# Patient Record
Sex: Female | Born: 1960
Health system: Southern US, Community
[De-identification: ages and names within clinical notes are randomized; demographics above are authoritative.]

## PROBLEM LIST (undated history)

## (undated) DIAGNOSIS — R9389 Abnormal findings on diagnostic imaging of other specified body structures: Secondary | ICD-10-CM

## (undated) DIAGNOSIS — D649 Anemia, unspecified: Secondary | ICD-10-CM

## (undated) DIAGNOSIS — F1020 Alcohol dependence, uncomplicated: Secondary | ICD-10-CM

## (undated) DIAGNOSIS — F319 Bipolar disorder, unspecified: Secondary | ICD-10-CM

## (undated) DIAGNOSIS — E782 Mixed hyperlipidemia: Secondary | ICD-10-CM

## (undated) DIAGNOSIS — F1410 Cocaine abuse, uncomplicated: Secondary | ICD-10-CM

## (undated) DIAGNOSIS — A599 Trichomoniasis, unspecified: Secondary | ICD-10-CM

## (undated) DIAGNOSIS — K760 Fatty (change of) liver, not elsewhere classified: Secondary | ICD-10-CM

## (undated) DIAGNOSIS — R569 Unspecified convulsions: Secondary | ICD-10-CM

## (undated) DIAGNOSIS — Z8659 Personal history of other mental and behavioral disorders: Secondary | ICD-10-CM

## (undated) DIAGNOSIS — Z973 Presence of spectacles and contact lenses: Secondary | ICD-10-CM

## (undated) DIAGNOSIS — Z87898 Personal history of other specified conditions: Secondary | ICD-10-CM

## (undated) HISTORY — PX: CERVICAL BIOPSY: SHX590

## (undated) HISTORY — PX: TUBAL LIGATION: SHX77

## (undated) HISTORY — DX: Alcohol dependence, uncomplicated: F10.20

---

## 1992-10-14 HISTORY — PX: OTHER SURGICAL HISTORY: SHX169

## 1999-09-21 ENCOUNTER — Emergency Department (HOSPITAL_COMMUNITY): Admission: EM | Admit: 1999-09-21 | Discharge: 1999-09-21 | Payer: Self-pay | Admitting: Emergency Medicine

## 1999-09-25 ENCOUNTER — Emergency Department (HOSPITAL_COMMUNITY): Admission: EM | Admit: 1999-09-25 | Discharge: 1999-09-25 | Payer: Self-pay | Admitting: Emergency Medicine

## 1999-11-15 ENCOUNTER — Encounter (INDEPENDENT_AMBULATORY_CARE_PROVIDER_SITE_OTHER): Payer: Self-pay | Admitting: *Deleted

## 1999-11-15 LAB — CONVERTED CEMR LAB

## 1999-11-27 ENCOUNTER — Other Ambulatory Visit: Admission: RE | Admit: 1999-11-27 | Discharge: 1999-11-27 | Payer: Self-pay | Admitting: *Deleted

## 1999-11-27 ENCOUNTER — Encounter: Admission: RE | Admit: 1999-11-27 | Discharge: 1999-11-27 | Payer: Self-pay | Admitting: Sports Medicine

## 2000-05-29 ENCOUNTER — Encounter: Admission: RE | Admit: 2000-05-29 | Discharge: 2000-05-29 | Payer: Self-pay | Admitting: Family Medicine

## 2000-10-23 ENCOUNTER — Emergency Department (HOSPITAL_COMMUNITY): Admission: EM | Admit: 2000-10-23 | Discharge: 2000-10-23 | Payer: Self-pay | Admitting: Emergency Medicine

## 2001-09-29 ENCOUNTER — Emergency Department (HOSPITAL_COMMUNITY): Admission: EM | Admit: 2001-09-29 | Discharge: 2001-09-29 | Payer: Self-pay | Admitting: Emergency Medicine

## 2002-10-22 ENCOUNTER — Inpatient Hospital Stay (HOSPITAL_COMMUNITY): Admission: AD | Admit: 2002-10-22 | Discharge: 2002-10-22 | Payer: Self-pay | Admitting: Obstetrics and Gynecology

## 2002-10-22 ENCOUNTER — Encounter: Payer: Self-pay | Admitting: Obstetrics and Gynecology

## 2002-11-10 ENCOUNTER — Inpatient Hospital Stay (HOSPITAL_COMMUNITY): Admission: AD | Admit: 2002-11-10 | Discharge: 2002-11-10 | Payer: Self-pay | Admitting: Obstetrics and Gynecology

## 2003-05-25 ENCOUNTER — Emergency Department (HOSPITAL_COMMUNITY): Admission: EM | Admit: 2003-05-25 | Discharge: 2003-05-26 | Payer: Self-pay | Admitting: Emergency Medicine

## 2003-11-05 ENCOUNTER — Emergency Department (HOSPITAL_COMMUNITY): Admission: EM | Admit: 2003-11-05 | Discharge: 2003-11-05 | Payer: Self-pay | Admitting: Emergency Medicine

## 2004-04-17 ENCOUNTER — Emergency Department (HOSPITAL_COMMUNITY): Admission: AC | Admit: 2004-04-17 | Discharge: 2004-04-17 | Payer: Self-pay | Admitting: Emergency Medicine

## 2004-05-27 ENCOUNTER — Emergency Department (HOSPITAL_COMMUNITY): Admission: EM | Admit: 2004-05-27 | Discharge: 2004-05-27 | Payer: Self-pay | Admitting: Emergency Medicine

## 2004-05-29 ENCOUNTER — Emergency Department (HOSPITAL_COMMUNITY): Admission: EM | Admit: 2004-05-29 | Discharge: 2004-05-29 | Payer: Self-pay | Admitting: Family Medicine

## 2004-11-29 ENCOUNTER — Emergency Department (HOSPITAL_COMMUNITY): Admission: EM | Admit: 2004-11-29 | Discharge: 2004-11-29 | Payer: Self-pay | Admitting: *Deleted

## 2005-11-14 ENCOUNTER — Encounter: Payer: Self-pay | Admitting: Family Medicine

## 2005-11-14 ENCOUNTER — Emergency Department (HOSPITAL_COMMUNITY): Admission: EM | Admit: 2005-11-14 | Discharge: 2005-11-15 | Payer: Self-pay | Admitting: Emergency Medicine

## 2006-04-26 ENCOUNTER — Emergency Department (HOSPITAL_COMMUNITY): Admission: EM | Admit: 2006-04-26 | Discharge: 2006-04-26 | Payer: Self-pay | Admitting: Emergency Medicine

## 2006-07-23 ENCOUNTER — Emergency Department (HOSPITAL_COMMUNITY): Admission: EM | Admit: 2006-07-23 | Discharge: 2006-07-23 | Payer: Self-pay | Admitting: Emergency Medicine

## 2006-12-12 ENCOUNTER — Encounter (INDEPENDENT_AMBULATORY_CARE_PROVIDER_SITE_OTHER): Payer: Self-pay | Admitting: *Deleted

## 2007-05-18 ENCOUNTER — Inpatient Hospital Stay (HOSPITAL_COMMUNITY): Admission: AD | Admit: 2007-05-18 | Discharge: 2007-05-18 | Payer: Self-pay | Admitting: Gynecology

## 2007-06-30 ENCOUNTER — Emergency Department (HOSPITAL_COMMUNITY): Admission: EM | Admit: 2007-06-30 | Discharge: 2007-06-30 | Payer: Self-pay | Admitting: Emergency Medicine

## 2008-09-23 ENCOUNTER — Emergency Department (HOSPITAL_COMMUNITY): Admission: EM | Admit: 2008-09-23 | Discharge: 2008-09-23 | Payer: Self-pay | Admitting: Emergency Medicine

## 2008-10-14 DIAGNOSIS — A599 Trichomoniasis, unspecified: Secondary | ICD-10-CM

## 2008-10-14 DIAGNOSIS — Z8619 Personal history of other infectious and parasitic diseases: Secondary | ICD-10-CM

## 2008-10-14 HISTORY — DX: Trichomoniasis, unspecified: A59.9

## 2008-10-14 HISTORY — DX: Personal history of other infectious and parasitic diseases: Z86.19

## 2009-03-01 ENCOUNTER — Emergency Department (HOSPITAL_COMMUNITY): Admission: EM | Admit: 2009-03-01 | Discharge: 2009-03-01 | Payer: Self-pay | Admitting: Family Medicine

## 2009-07-06 ENCOUNTER — Encounter: Payer: Self-pay | Admitting: Obstetrics & Gynecology

## 2009-07-06 ENCOUNTER — Ambulatory Visit: Payer: Self-pay | Admitting: Obstetrics & Gynecology

## 2009-07-06 LAB — CONVERTED CEMR LAB

## 2009-07-16 ENCOUNTER — Emergency Department (HOSPITAL_COMMUNITY): Admission: EM | Admit: 2009-07-16 | Discharge: 2009-07-16 | Payer: Self-pay | Admitting: Emergency Medicine

## 2010-04-17 ENCOUNTER — Emergency Department: Payer: Self-pay | Admitting: Unknown Physician Specialty

## 2010-05-09 ENCOUNTER — Emergency Department (HOSPITAL_COMMUNITY): Admission: EM | Admit: 2010-05-09 | Discharge: 2010-05-09 | Payer: Self-pay | Admitting: Family Medicine

## 2010-08-05 ENCOUNTER — Emergency Department (HOSPITAL_COMMUNITY): Admission: EM | Admit: 2010-08-05 | Discharge: 2010-08-05 | Payer: Self-pay | Admitting: Family Medicine

## 2010-11-04 ENCOUNTER — Encounter: Payer: Self-pay | Admitting: *Deleted

## 2010-12-29 LAB — POCT URINALYSIS DIP (DEVICE)
Glucose, UA: NEGATIVE mg/dL
Ketones, ur: NEGATIVE mg/dL
Protein, ur: NEGATIVE mg/dL

## 2010-12-29 LAB — POCT PREGNANCY, URINE: Preg Test, Ur: NEGATIVE

## 2010-12-29 LAB — GC/CHLAMYDIA PROBE AMP, GENITAL: GC Probe Amp, Genital: NEGATIVE

## 2010-12-29 LAB — WET PREP, GENITAL: Yeast Wet Prep HPF POC: NONE SEEN

## 2011-01-17 LAB — CBC
Hemoglobin: 9.8 g/dL — ABNORMAL LOW (ref 12.0–15.0)
MCHC: 32 g/dL (ref 30.0–36.0)
MCV: 70.1 fL — ABNORMAL LOW (ref 78.0–100.0)
RBC: 4.4 MIL/uL (ref 3.87–5.11)

## 2011-01-17 LAB — URINALYSIS, ROUTINE W REFLEX MICROSCOPIC
Bilirubin Urine: NEGATIVE
Glucose, UA: NEGATIVE mg/dL
Hgb urine dipstick: NEGATIVE
Ketones, ur: NEGATIVE mg/dL
Protein, ur: NEGATIVE mg/dL
pH: 6 (ref 5.0–8.0)

## 2011-01-17 LAB — BASIC METABOLIC PANEL
CO2: 27 mEq/L (ref 19–32)
Chloride: 100 mEq/L (ref 96–112)
GFR calc Af Amer: >60 mL/min (ref >60)
Glucose, Bld: 105 mg/dL — ABNORMAL HIGH (ref 70–99)
Sodium: 135 mEq/L (ref 135–145)

## 2011-01-17 LAB — DIFFERENTIAL
Basophils Relative: 0 % (ref 0–1)
Eosinophils Relative: 1 % (ref 0–5)
Lymphs Abs: 1.1 10*3/uL (ref 0.7–4.0)
Monocytes Absolute: 0.7 10*3/uL (ref 0.1–1.0)
Neutro Abs: 8.3 10*3/uL — ABNORMAL HIGH (ref 1.7–7.7)

## 2011-01-17 LAB — WET PREP, GENITAL: Yeast Wet Prep HPF POC: NONE SEEN

## 2011-01-22 LAB — POCT URINALYSIS DIP (DEVICE)
Bilirubin Urine: NEGATIVE
Glucose, UA: NEGATIVE mg/dL
Hgb urine dipstick: NEGATIVE
Ketones, ur: NEGATIVE mg/dL
Nitrite: NEGATIVE
Specific Gravity, Urine: <=1.005 (ref 1.005–1.030)
pH: 5.5 (ref 5.0–8.0)

## 2011-07-19 LAB — POCT URINALYSIS DIP (DEVICE)
Bilirubin Urine: NEGATIVE
Glucose, UA: NEGATIVE mg/dL
Ketones, ur: NEGATIVE mg/dL
Nitrite: NEGATIVE
Specific Gravity, Urine: 1.015 (ref 1.005–1.030)
pH: 6 (ref 5.0–8.0)

## 2011-07-29 LAB — URINALYSIS, ROUTINE W REFLEX MICROSCOPIC
Glucose, UA: NEGATIVE
Leukocytes, UA: NEGATIVE
Protein, ur: NEGATIVE
Specific Gravity, Urine: 1.02

## 2011-07-29 LAB — WET PREP, GENITAL

## 2011-07-29 LAB — URINE MICROSCOPIC-ADD ON

## 2011-07-29 LAB — GC/CHLAMYDIA PROBE AMP, GENITAL: GC Probe Amp, Genital: NEGATIVE

## 2011-07-29 LAB — POCT PREGNANCY, URINE: Operator id: 114931

## 2011-09-20 ENCOUNTER — Emergency Department: Payer: Self-pay | Admitting: Emergency Medicine

## 2011-10-09 ENCOUNTER — Ambulatory Visit: Payer: Self-pay | Admitting: Obstetrics and Gynecology

## 2011-10-23 ENCOUNTER — Emergency Department (HOSPITAL_COMMUNITY)
Admission: EM | Admit: 2011-10-23 | Discharge: 2011-10-23 | Disposition: A | Discharge status: Home / Self Care | Payer: Self-pay | Source: Home / Self Care

## 2011-10-23 ENCOUNTER — Encounter (HOSPITAL_COMMUNITY): Payer: Self-pay | Admitting: Cardiology

## 2011-10-23 DIAGNOSIS — N760 Acute vaginitis: Secondary | ICD-10-CM

## 2011-10-23 DIAGNOSIS — M25579 Pain in unspecified ankle and joints of unspecified foot: Secondary | ICD-10-CM

## 2011-10-23 DIAGNOSIS — M25571 Pain in right ankle and joints of right foot: Secondary | ICD-10-CM

## 2011-10-23 HISTORY — DX: Trichomoniasis, unspecified: A59.9

## 2011-10-23 LAB — POCT URINALYSIS DIP (DEVICE)
Bilirubin Urine: NEGATIVE
Glucose, UA: NEGATIVE mg/dL
Ketones, ur: NEGATIVE mg/dL
Leukocytes, UA: NEGATIVE
Nitrite: NEGATIVE

## 2011-10-23 LAB — WET PREP, GENITAL: Yeast Wet Prep HPF POC: NONE SEEN

## 2011-10-23 MED ORDER — DICLOFENAC SODIUM 75 MG PO TBEC: 75.0000 mg | DELAYED_RELEASE_TABLET | Freq: Two times a day (BID) | ORAL | Status: AC

## 2011-10-23 MED ORDER — METRONIDAZOLE 500 MG PO TABS: 500.0000 mg | ORAL_TABLET | Freq: Two times a day (BID) | ORAL | Status: AC

## 2011-10-23 NOTE — ED Notes (Signed)
Pt reports vaginal discharge brownish in color with fishy odor. Vaginal itching and burning on urination.This has been going on for the past 3 weeks. Pt also reports bilat foot pain and is requesting referral to a "foot" doctor. Denies fever.

## 2011-10-23 NOTE — ED Provider Notes (Signed)
History     CSN: [id number]  Arrival date & time 10/23/11  0855   None     Chief Complaint  Patient presents with  . Vaginal Discharge    (Consider location/radiation/quality/duration/timing/severity/associated sxs/prior treatment) HPI Comments: Pt presents today with two concerns. She has had vaginal discharge and odor x 3 weeks. She denies any new partners. No pelvic pain. Sometimes has mild discomfort with urination, but no urinary frequency or urgency. Pt also began having Rt ankle pain 2 days ago. Pain has improved but not completely resolved. She denies injury or aggrevating activity. She has a hx of bunions and is concerned that the pain is coming from her bunion.   The history is provided by the patient.    Past Medical History  Diagnosis Date  . Trichimoniasis 2010    Past Surgical History  Procedure Date  . Cesarean section 1990  . Tubaligation 1994    History reviewed. No pertinent family history.  History  Substance Use Topics  . Smoking status: Current Everyday Smoker -- 0.5 packs/day  . Smokeless tobacco: Not on file  . Alcohol Use: Yes     occas    OB History    Grav Para Term Preterm Abortions TAB SAB Ect Mult Living                  Review of Systems  Genitourinary: Positive for vaginal discharge. Negative for dysuria, frequency, vaginal bleeding and pelvic pain.  Musculoskeletal: Negative for joint swelling.    Allergies  Review of patient's allergies indicates no known allergies.  Home Medications   Current Outpatient Rx  Name Route Sig Dispense Refill  . DICLOFENAC SODIUM 75 MG PO TBEC Oral Take 1 tablet (75 mg total) by mouth 2 (two) times daily. 20 tablet 0  . METRONIDAZOLE 500 MG PO TABS Oral Take 1 tablet (500 mg total) by mouth 2 (two) times daily. 14 tablet 0    BP 147/98  Pulse 65  Temp(Src) 98 F (36.7 C) (Oral)  Resp 16  SpO2 99%  LMP 03/15/2011  Physical Exam  Nursing note and vitals reviewed. Constitutional: She  appears well-developed and well-nourished. No distress.  HENT:  Head: Normocephalic and atraumatic.  Cardiovascular: Normal rate, regular rhythm and normal heart sounds.   Pulmonary/Chest: Effort normal and breath sounds normal. No respiratory distress.  Genitourinary: Uterus normal. There is no rash, tenderness or lesion on the right labia. There is no rash, tenderness or lesion on the left labia. Cervix exhibits no motion tenderness, no discharge and no friability. Right adnexum displays no mass, no tenderness and no fullness. Left adnexum displays no mass, no tenderness and no fullness. No erythema, tenderness or bleeding around the vagina. No foreign body around the vagina. No signs of injury around the vagina. Vaginal discharge found.  Musculoskeletal:       Right ankle: She exhibits normal range of motion, no swelling, no ecchymosis, no deformity, no laceration and normal pulse. tenderness. AITFL (mild) tenderness found. No lateral malleolus, no medial malleolus, no CF ligament, no posterior TFL, no head of 5th metatarsal and no proximal fibula tenderness found. Achilles tendon normal.       Feet:    ED Course  Procedures (including critical care time)   Labs Reviewed  POCT URINALYSIS DIP (DEVICE)  POCT URINALYSIS DIPSTICK  GC/CHLAMYDIA PROBE AMP, GENITAL  WET PREP, GENITAL   No results found.   1. Vaginitis   2. Ankle pain, right  MDM  Vaginal discharge x 3 wks. GC/Chlamydia & wet prep pending.  Mild Rt ankle pain - improving, no trauma and nontender Rt hallux valgux. Discussed bunions and referral to orthopedist if becomes painful. Ankle pain is improving w/o treatment. Will treat with NSAID and f/u with ortho if symptoms persist or worsen.         Melody Comas, [place] 10/28/11 [phone]

## 2011-10-24 LAB — GC/CHLAMYDIA PROBE AMP, GENITAL: Chlamydia, DNA Probe: NEGATIVE

## 2011-10-25 ENCOUNTER — Telehealth (HOSPITAL_COMMUNITY): Payer: Self-pay | Admitting: *Deleted

## 2011-10-28 ENCOUNTER — Telehealth (HOSPITAL_COMMUNITY): Payer: Self-pay | Admitting: *Deleted

## 2011-10-29 ENCOUNTER — Telehealth (HOSPITAL_COMMUNITY): Payer: Self-pay | Admitting: *Deleted

## 2011-10-29 NOTE — ED Provider Notes (Signed)
Medical screening examination/treatment/procedure(s) were performed by non-physician practitioner and as supervising physician I was immediately available for consultation/collaboration.  Corrie Mckusick, MD 10/29/11 1215

## 2012-10-20 ENCOUNTER — Encounter (HOSPITAL_COMMUNITY): Payer: Self-pay | Admitting: *Deleted

## 2012-10-20 ENCOUNTER — Emergency Department (INDEPENDENT_AMBULATORY_CARE_PROVIDER_SITE_OTHER)
Admission: EM | Admit: 2012-10-20 | Discharge: 2012-10-20 | Disposition: A | Discharge status: Home / Self Care | Payer: Self-pay | Source: Home / Self Care | Attending: Family Medicine | Admitting: Family Medicine

## 2012-10-20 DIAGNOSIS — G5600 Carpal tunnel syndrome, unspecified upper limb: Secondary | ICD-10-CM

## 2012-10-20 DIAGNOSIS — G5603 Carpal tunnel syndrome, bilateral upper limbs: Secondary | ICD-10-CM

## 2012-10-20 MED ORDER — DICLOFENAC POTASSIUM 50 MG PO TABS: 50.0000 mg | ORAL_TABLET | Freq: Three times a day (TID) | ORAL | Status: DC

## 2012-10-20 NOTE — ED Notes (Signed)
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

## 2012-10-20 NOTE — ED Provider Notes (Signed)
History     CSN: [id number]  Arrival date & time 10/20/12  1126   First MD Initiated Contact with Patient 10/20/12 1144      Chief Complaint  Patient presents with  . Hand Problem    (Consider location/radiation/quality/duration/timing/severity/associated sxs/prior treatment) Patient is a 52 y.o. female presenting with hand pain. The history is provided by the patient.  Hand Pain This is a new problem. The current episode started more than 1 week ago (has been told in [place] that she had CTS and would needed surg but sx resolved  until recently.). The problem has been gradually worsening.    Past Medical History  Diagnosis Date  . Trichimoniasis 2010    Past Surgical History  Procedure Date  . Cesarean section 1990  . Tubaligation 1994    No family history on file.  History  Substance Use Topics  . Smoking status: Current Every Day Smoker -- 0.5 packs/day  . Smokeless tobacco: Not on file  . Alcohol Use: Yes     Comment: occas    OB History    Grav Para Term Preterm Abortions TAB SAB Ect Mult Living                  Review of Systems  Constitutional: Negative.   Musculoskeletal: Positive for myalgias.  Skin: Negative.   Neurological: Positive for numbness.    Allergies  Review of patient's allergies indicates no known allergies.  Home Medications   Current Outpatient Rx  Name  Route  Sig  Dispense  Refill  . DICLOFENAC POTASSIUM 50 MG PO TABS   Oral   Take 1 tablet (50 mg total) by mouth 3 (three) times daily.   30 tablet   0   . DICLOFENAC SODIUM 75 MG PO TBEC   Oral   Take 1 tablet (75 mg total) by mouth 2 (two) times daily.   20 tablet   0     BP 128/87  Pulse 80  Temp 97.9 F (36.6 C) (Oral)  Resp 16  SpO2 98%  LMP 03/15/2011  Physical Exam  Nursing note and vitals reviewed. Constitutional: She is oriented to person, place, and time. She appears well-developed and well-nourished.  Musculoskeletal: She exhibits tenderness.   Right wrist: She exhibits tenderness. She exhibits normal range of motion.       Arms: Neurological: She is alert and oriented to person, place, and time.  Skin: Skin is warm and dry.    ED Course  Procedures (including critical care time)  Labs Reviewed - No data to display No results found.   1. Carpal tunnel syndrome on both sides       MDM          Linna Hoff, MD 10/20/12 1218

## 2012-10-20 NOTE — ED Notes (Signed)
Bilateral  Wrist splints

## 2013-07-08 ENCOUNTER — Encounter (HOSPITAL_COMMUNITY): Payer: Self-pay | Admitting: Emergency Medicine

## 2013-07-08 ENCOUNTER — Emergency Department (HOSPITAL_COMMUNITY)
Admission: EM | Admit: 2013-07-08 | Discharge: 2013-07-08 | Disposition: A | Discharge status: Home / Self Care | Payer: Self-pay | Attending: Emergency Medicine | Admitting: Emergency Medicine

## 2013-07-08 DIAGNOSIS — F17200 Nicotine dependence, unspecified, uncomplicated: Secondary | ICD-10-CM | POA: Insufficient documentation

## 2013-07-08 DIAGNOSIS — N898 Other specified noninflammatory disorders of vagina: Secondary | ICD-10-CM | POA: Insufficient documentation

## 2013-07-08 DIAGNOSIS — M545 Low back pain, unspecified: Secondary | ICD-10-CM | POA: Insufficient documentation

## 2013-07-08 DIAGNOSIS — M25579 Pain in unspecified ankle and joints of unspecified foot: Secondary | ICD-10-CM | POA: Insufficient documentation

## 2013-07-08 DIAGNOSIS — A599 Trichomoniasis, unspecified: Secondary | ICD-10-CM | POA: Insufficient documentation

## 2013-07-08 DIAGNOSIS — M25571 Pain in right ankle and joints of right foot: Secondary | ICD-10-CM

## 2013-07-08 DIAGNOSIS — R209 Unspecified disturbances of skin sensation: Secondary | ICD-10-CM | POA: Insufficient documentation

## 2013-07-08 LAB — WET PREP, GENITAL

## 2013-07-08 MED ORDER — OXYCODONE-ACETAMINOPHEN 5-325 MG PO TABS
2.0000 | ORAL_TABLET | Freq: Once | ORAL | Status: AC
  Administered 2013-07-08: 2 via ORAL
  Filled 2013-07-08: qty 2

## 2013-07-08 MED ORDER — METRONIDAZOLE 500 MG PO TABS: 500.0000 mg | ORAL_TABLET | Freq: Two times a day (BID) | ORAL | Status: DC

## 2013-07-08 MED ORDER — OXYCODONE-ACETAMINOPHEN 5-325 MG PO TABS: 1.0000 | ORAL_TABLET | Freq: Four times a day (QID) | ORAL | Status: DC | PRN

## 2013-07-08 MED ORDER — NAPROXEN 500 MG PO TABS: 500.0000 mg | ORAL_TABLET | Freq: Two times a day (BID) | ORAL | Status: DC

## 2013-07-08 NOTE — ED Notes (Signed)
Bed: WA17 Expected date:  Expected time:  Means of arrival:  Comments: 

## 2013-07-08 NOTE — ED Notes (Signed)
Brought in by [facility] from home with c/o severe right leg pain, from right hip down to right ankle--- onset 3 days ago. Per EMS, pt has progressive pain to right hip down to right ankle; pt also c/o "right foot is feeling numb". Pt denies trauma or injury to affected leg, no s/s apparent deformity to right leg noted.

## 2013-07-08 NOTE — ED Provider Notes (Signed)
CSN: [id number]     Arrival date & time 07/08/13  0612 History   First MD Initiated Contact with Patient 07/08/13 [phone]     Chief Complaint  Patient presents with  . Leg Pain   (Consider location/radiation/quality/duration/timing/severity/associated sxs/prior Treatment) HPI Comments: Patient presents with two separate complaints.  She states that she has been having pain of her right leg for the past 3 days.  Pain starts in the right ankle and radiates up to the right hip.  Pain worse in the right ankle.  She also reports intermittent numbness/tingling of the right foot over the past 3 days.  She denies any injury or trauma to the area.  Denies any erythema or edema of the leg.  She describes the pain as a sharp shooting pain.  She reports very mild pain of her lower back.  She has taken Advil for the pain with mild relief.  Pain worse with movement.  She states that she has had ankle pain in the past and feels that this may be causing the pain in her leg.  She denies fever, chills, bowel/bladder incontinence, weakness, headache, facial asymmetry, difficulty swallowing, or any other symptoms.  Denies recent prolonged travel, surgeries, use of estrogen containing medications, or history of DVT.  Patient able to ambulate.  Patient also complaining of vaginal discharge for the past week.  She states that she is concerned that she may have Trichimoniasis.  She has had this in the past.  She reports whitish colored vaginal discharge with a "fishy" odor.  She is sexually active.  She denies any abdominal or pelvic pain.  Denies fever or chills.    The history is provided by the patient.    Past Medical History  Diagnosis Date  . Trichimoniasis 2010   Past Surgical History  Procedure Laterality Date  . Cesarean section  1990  . Tubaligation  1994   History reviewed. No pertinent family history. History  Substance Use Topics  . Smoking status: Current Every Day Smoker -- 0.50 packs/day  .  Smokeless tobacco: Not on file  . Alcohol Use: Yes     Comment: occas   OB History   Grav Para Term Preterm Abortions TAB SAB Ect Mult Living                 Review of Systems  Allergies  Review of patient's allergies indicates no known allergies.  Home Medications   Current Outpatient Rx  Name  Route  Sig  Dispense  Refill  . metroNIDAZOLE (FLAGYL) 500 MG tablet   Oral   Take 1 tablet (500 mg total) by mouth 2 (two) times daily.   14 tablet   0   . naproxen (NAPROSYN) 500 MG tablet   Oral   Take 1 tablet (500 mg total) by mouth 2 (two) times daily.   30 tablet   0   . oxyCODONE-acetaminophen (PERCOCET/ROXICET) 5-325 MG per tablet   Oral   Take 1-2 tablets by mouth every 6 (six) hours as needed for pain.   20 tablet   0    BP 129/92  Pulse 88  Temp(Src) 99 F (37.2 C)  Resp 16  SpO2 100%  LMP 03/15/2011 Physical Exam  Nursing note and vitals reviewed. Constitutional: She appears well-developed and well-nourished.  HENT:  Head: Normocephalic and atraumatic.  Neck: Normal range of motion. Neck supple.  Cardiovascular: Normal rate, regular rhythm and normal heart sounds.   Pulses:      Dorsalis  pedis pulses are 2+ on the right side, and 2+ on the left side.  Pulmonary/Chest: Effort normal and breath sounds normal.  Abdominal: Soft. There is no tenderness.  Genitourinary: Cervix exhibits no motion tenderness. Right adnexum displays no mass, no tenderness and no fullness. Left adnexum displays no mass, no tenderness and no fullness.  yellowish colored discharge in the vaginal vault.  Musculoskeletal: Normal range of motion.       Right hip: She exhibits normal range of motion, normal strength, no tenderness, no bony tenderness, no swelling and no deformity.       Right knee: She exhibits normal range of motion, no swelling, no effusion and no erythema. No tenderness found.       Right ankle: She exhibits normal range of motion, no swelling and no deformity.  Tenderness.  Bunion of the right foot No erythema or edema of the right leg Full ROM of the right leg  Neurological: She is alert.  Distal sensation of the right foot intact  Skin: Skin is warm and dry. No erythema.  Psychiatric: She has a normal mood and affect.    ED Course  Procedures (including critical care time) Labs Review Labs Reviewed  GC/CHLAMYDIA PROBE AMP  WET PREP, GENITAL   Imaging Review No results found.  7:27 AM Reassessed patient.  She reports that her pain has improved at this time.   MDM  No diagnosis found. Patient presenting with pain of her right leg for the past 3 days.  She reports that the pain is worse in the ankle and radiates up her entire leg to her right hip.  Patient with full ROM of all joints of the right leg.  No signs of infection or DVT.  Patient afebrile.  Patient denies trauma.  Able to ambulate.  Pain improved in the ED after given pain medication.  Feel that the patient is stable for discharge.  Patient discharged home with pain medication and instructed to follow up with PCP if pain persists.  Return precautions given.  Patient also complaining of vaginal discharge.  Few trich on wet prep.  Patient treated with Flagyl.  GC/Chlamydia pending.  No pain with pelvic exam.  Patient given instructions to not drink alcohol while taking Flagyl.      Pascal Lux [place], PA-C 07/08/13 1049

## 2013-07-13 NOTE — ED Provider Notes (Signed)
Medical screening examination/treatment/procedure(s) were performed by non-physician practitioner and as supervising physician I was immediately available for consultation/collaboration.  Raeford Razor, MD 07/13/13 [phone]

## 2013-07-27 ENCOUNTER — Ambulatory Visit: Payer: Self-pay

## 2013-08-12 ENCOUNTER — Ambulatory Visit: Payer: Self-pay

## 2013-09-01 ENCOUNTER — Emergency Department (HOSPITAL_COMMUNITY)
Admission: EM | Admit: 2013-09-01 | Discharge: 2013-09-01 | Disposition: A | Discharge status: Home / Self Care | Payer: Self-pay | Attending: Emergency Medicine | Admitting: Emergency Medicine

## 2013-09-01 ENCOUNTER — Encounter (HOSPITAL_COMMUNITY): Payer: Self-pay | Admitting: Emergency Medicine

## 2013-09-01 DIAGNOSIS — B356 Tinea cruris: Secondary | ICD-10-CM | POA: Insufficient documentation

## 2013-09-01 DIAGNOSIS — F17200 Nicotine dependence, unspecified, uncomplicated: Secondary | ICD-10-CM | POA: Insufficient documentation

## 2013-09-01 DIAGNOSIS — L02419 Cutaneous abscess of limb, unspecified: Secondary | ICD-10-CM | POA: Insufficient documentation

## 2013-09-01 DIAGNOSIS — L0291 Cutaneous abscess, unspecified: Secondary | ICD-10-CM

## 2013-09-01 MED ORDER — SULFAMETHOXAZOLE-TRIMETHOPRIM 800-160 MG PO TABS: 1.0000 | ORAL_TABLET | Freq: Two times a day (BID) | ORAL | Status: AC

## 2013-09-01 MED ORDER — HYDROCODONE-ACETAMINOPHEN 5-325 MG PO TABS: 2.0000 | ORAL_TABLET | ORAL | Status: DC | PRN

## 2013-09-01 MED ORDER — NYSTATIN 100000 UNIT/GM EX CREA: 1.0000 "application " | TOPICAL_CREAM | Freq: Two times a day (BID) | CUTANEOUS | Status: DC

## 2013-09-01 NOTE — Progress Notes (Signed)
Patient listed as not having a pcp or insurance.  EDCM spoke to patient at bedside.  Patient reports that she has applied for Medicaid and is Medicaid pending.  Patient also reports that she is planning to become a patient at the [facility] [place] [facility].  No further CM needs at this time.

## 2013-09-01 NOTE — ED Provider Notes (Signed)
CSN: [id number]     Arrival date & time 09/01/13  1320 History   First MD Initiated Contact with Patient 09/01/13 1540     Chief Complaint  Patient presents with  . Recurrent Skin Infections  . Rash   (Consider location/radiation/quality/duration/timing/severity/associated sxs/prior Treatment) HPI Comments: Patient presents to the ER for evaluation of a painful lesion on her left upper thigh. Patient reports that started as a painful bump approximately a week ago. 2 days ago, the area opened up and has been draining bloody discharge. It is still very painful.  She reports that before the appearance of this lesion she started having itching and rash in both of her groin area.  Patient is a 52 y.o. female presenting with rash.  Rash Associated symptoms: no fever     Past Medical History  Diagnosis Date  . Trichimoniasis 2010   Past Surgical History  Procedure Laterality Date  . Cesarean section  1990  . Tubaligation  1994   No family history on file. History  Substance Use Topics  . Smoking status: Current Every Day Smoker -- 0.50 packs/day  . Smokeless tobacco: Not on file  . Alcohol Use: Yes     Comment: occas   OB History   Grav Para Term Preterm Abortions TAB SAB Ect Mult Living                 Review of Systems  Constitutional: Negative for fever.  Skin: Positive for rash and wound.    Allergies  Review of patient's allergies indicates no known allergies.  Home Medications  No current outpatient prescriptions on file. BP 120/85  Pulse 97  Temp(Src) 98.3 F (36.8 C) (Oral)  Resp 19  Ht 5\' 4"  (1.626 m)  Wt 180 lb (81.647 kg)  BMI 30.88 kg/m2  SpO2 99%  LMP 03/15/2011 Physical Exam  Constitutional: She is oriented to person, place, and time.  Genitourinary: Vagina normal.  Musculoskeletal: Normal range of motion.  Neurological: She is alert and oriented to person, place, and time.  Skin: Skin is warm.       ED Course  Procedures (including  critical care time) Labs Review Labs Reviewed - No data to display Imaging Review No results found.  EKG Interpretation   None       MDM  Diagnosis: 1. Skin abscess 2. Tinea cruris  Patient has evidence of tinea cruris which has progressed to superinfection including small skin abscess of the left upper thigh. Will require continued treatment of the tinea cruris, will add Bactrim and pain control. Return if symptoms worsen.    Gilda Crease, MD 09/01/13 [phone]

## 2013-09-01 NOTE — ED Notes (Signed)
Pt c/o boil like bump on inner thigh that has been bleeding for 2 days and vaginal rash for about 4 days that itches.

## 2013-09-01 NOTE — ED Notes (Signed)
Pt here for boil and pain inner lt thigh with drainage, hurts when walking

## 2013-09-01 NOTE — Progress Notes (Signed)
[facility] provided pt with a list of primary care resources. Patient stated she is pending Medicaid.

## 2013-10-11 ENCOUNTER — Ambulatory Visit: Payer: Self-pay | Admitting: Internal Medicine

## 2013-10-11 ENCOUNTER — Ambulatory Visit: Payer: Self-pay

## 2013-10-18 ENCOUNTER — Ambulatory Visit: Payer: Self-pay

## 2014-02-28 ENCOUNTER — Emergency Department (HOSPITAL_COMMUNITY): Payer: Self-pay

## 2014-02-28 ENCOUNTER — Emergency Department (HOSPITAL_COMMUNITY)
Admission: EM | Admit: 2014-02-28 | Discharge: 2014-02-28 | Disposition: A | Discharge status: Home / Self Care | Payer: Self-pay | Attending: Emergency Medicine | Admitting: Emergency Medicine

## 2014-02-28 ENCOUNTER — Encounter (HOSPITAL_COMMUNITY): Payer: Self-pay | Admitting: Emergency Medicine

## 2014-02-28 DIAGNOSIS — IMO0002 Reserved for concepts with insufficient information to code with codable children: Secondary | ICD-10-CM | POA: Insufficient documentation

## 2014-02-28 DIAGNOSIS — T7491XA Unspecified adult maltreatment, confirmed, initial encounter: Secondary | ICD-10-CM | POA: Insufficient documentation

## 2014-02-28 DIAGNOSIS — T7492XA Unspecified child maltreatment, confirmed, initial encounter: Secondary | ICD-10-CM | POA: Insufficient documentation

## 2014-02-28 DIAGNOSIS — S0083XA Contusion of other part of head, initial encounter: Secondary | ICD-10-CM | POA: Insufficient documentation

## 2014-02-28 DIAGNOSIS — S0510XA Contusion of eyeball and orbital tissues, unspecified eye, initial encounter: Secondary | ICD-10-CM | POA: Insufficient documentation

## 2014-02-28 DIAGNOSIS — T148XXA Other injury of unspecified body region, initial encounter: Secondary | ICD-10-CM

## 2014-02-28 DIAGNOSIS — Z8619 Personal history of other infectious and parasitic diseases: Secondary | ICD-10-CM | POA: Insufficient documentation

## 2014-02-28 DIAGNOSIS — F1010 Alcohol abuse, uncomplicated: Secondary | ICD-10-CM | POA: Insufficient documentation

## 2014-02-28 DIAGNOSIS — F17200 Nicotine dependence, unspecified, uncomplicated: Secondary | ICD-10-CM | POA: Insufficient documentation

## 2014-02-28 DIAGNOSIS — S1093XA Contusion of unspecified part of neck, initial encounter: Principal | ICD-10-CM

## 2014-02-28 DIAGNOSIS — R040 Epistaxis: Secondary | ICD-10-CM | POA: Insufficient documentation

## 2014-02-28 DIAGNOSIS — S0003XA Contusion of scalp, initial encounter: Secondary | ICD-10-CM | POA: Insufficient documentation

## 2014-02-28 MED ORDER — ACETAMINOPHEN 325 MG PO TABS
650.0000 mg | ORAL_TABLET | Freq: Once | ORAL | Status: AC
  Administered 2014-02-28: 650 mg via ORAL
  Filled 2014-02-28: qty 2

## 2014-02-28 MED ORDER — HYDROCODONE-ACETAMINOPHEN 5-325 MG PO TABS: 1.0000 | ORAL_TABLET | Freq: Four times a day (QID) | ORAL | Status: DC | PRN

## 2014-02-28 NOTE — Discharge Instructions (Signed)
Blunt Trauma °You have been evaluated for injuries. You have been examined and your caregiver has not found injuries serious enough to require hospitalization. °It is common to have multiple bruises and sore muscles following an accident. These tend to feel worse for the first 24 hours. You will feel more stiffness and soreness over the next several hours and worse when you wake up the first morning after your accident. After this point, you should begin to improve with each passing day. The amount of improvement depends on the amount of damage done in the accident. °Following your accident, if some part of your body does not work as it should, or if the pain in any area continues to increase, you should return to the Emergency Department for re-evaluation.  °HOME CARE INSTRUCTIONS  °Routine care for sore areas should include: °· Ice to sore areas every 2 hours for 20 minutes while awake for the next 2 days. °· Drink extra fluids (not alcohol). °· Take a hot or warm shower or bath once or twice a day to increase blood flow to sore muscles. This will help you "limber up". °· Activity as tolerated. Lifting may aggravate neck or back pain. °· Only take over-the-counter or prescription medicines for pain, discomfort, or fever as directed by your caregiver. Do not use aspirin. This may increase bruising or increase bleeding if there are small areas where this is happening. °SEEK IMMEDIATE MEDICAL CARE IF: °· Numbness, tingling, weakness, or problem with the use of your arms or legs. °· A severe headache is not relieved with medications. °· There is a change in bowel or bladder control. °· Increasing pain in any areas of the body. °· Short of breath or dizzy. °· Nauseated, vomiting, or sweating. °· Increasing belly (abdominal) discomfort. °· Blood in urine, stool, or vomiting blood. °· Pain in either shoulder in an area where a shoulder strap would be. °· Feelings of lightheadedness or if you have a fainting  episode. °Sometimes it is not possible to identify all injuries immediately after the trauma. It is important that you continue to monitor your condition after the emergency department visit. If you feel you are not improving, or improving more slowly than should be expected, call your physician. If you feel your symptoms (problems) are worsening, return to the Emergency Department immediately. °Document Released: 06/26/2001 Document Revised: 12/23/2011 Document Reviewed: 05/18/2008 °[employer]® Patient Information ©2014 [employer]. °Contusion °A contusion is a deep bruise. Contusions are the result of an injury that caused bleeding under the skin. The contusion may turn blue, purple, or yellow. Minor injuries will give you a painless contusion, but more severe contusions may stay painful and swollen for a few weeks.  °CAUSES  °A contusion is usually caused by a blow, trauma, or direct force to an area of the body. °SYMPTOMS  °· Swelling and redness of the injured area. °· Bruising of the injured area. °· Tenderness and soreness of the injured area. °· Pain. °DIAGNOSIS  °The diagnosis can be made by taking a history and physical exam. An X-ray, CT scan, or MRI may be needed to determine if there were any associated injuries, such as fractures. °TREATMENT  °Specific treatment will depend on what area of the body was injured. In general, the best treatment for a contusion is resting, icing, elevating, and applying cold compresses to the injured area. Over-the-counter medicines may also be recommended for pain control. Ask your caregiver what the best treatment is for your contusion. °HOME CARE   INSTRUCTIONS  °· Put ice on the injured area. °· Put ice in a plastic bag. °· Place a towel between your skin and the bag. °· Leave the ice on for 15-20 minutes, 03-04 times a day. °· Only take over-the-counter or prescription medicines for pain, discomfort, or fever as directed by your caregiver. Your caregiver may recommend  avoiding anti-inflammatory medicines (aspirin, ibuprofen, and naproxen) for 48 hours because these medicines may increase bruising. °· Rest the injured area. °· If possible, elevate the injured area to reduce swelling. °SEEK IMMEDIATE MEDICAL CARE IF:  °· You have increased bruising or swelling. °· You have pain that is getting worse. °· Your swelling or pain is not relieved with medicines. °MAKE SURE YOU:  °· Understand these instructions. °· Will watch your condition. °· Will get help right away if you are not doing well or get worse. °Document Released: 07/10/2005 Document Revised: 12/23/2011 Document Reviewed: 08/05/2011 °[employer]® Patient Information ©2014 [employer]. ° °
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
## 2014-07-18 ENCOUNTER — Ambulatory Visit
Admission: RE | Admit: 2014-07-18 | Discharge: 2014-07-18 | Disposition: A | Discharge status: Home / Self Care | Payer: No Typology Code available for payment source | Source: Ambulatory Visit | Attending: Infectious Disease | Admitting: Infectious Disease

## 2014-07-18 ENCOUNTER — Other Ambulatory Visit: Payer: Self-pay | Admitting: Infectious Disease

## 2014-07-18 DIAGNOSIS — R7611 Nonspecific reaction to tuberculin skin test without active tuberculosis: Secondary | ICD-10-CM

## 2014-09-13 DIAGNOSIS — F1020 Alcohol dependence, uncomplicated: Secondary | ICD-10-CM

## 2014-09-13 DIAGNOSIS — Z970 Presence of artificial eye: Secondary | ICD-10-CM

## 2014-09-13 HISTORY — DX: Presence of artificial eye: Z97.0

## 2014-09-13 HISTORY — PX: ENUCLEATION: SHX628

## 2014-09-13 HISTORY — DX: Alcohol dependence, uncomplicated: F10.20

## 2014-09-16 ENCOUNTER — Encounter (HOSPITAL_COMMUNITY): Payer: Self-pay | Admitting: *Deleted

## 2014-09-16 ENCOUNTER — Emergency Department (HOSPITAL_COMMUNITY): Payer: Self-pay

## 2014-09-16 ENCOUNTER — Encounter (HOSPITAL_COMMUNITY)
Admission: EM | Disposition: A | Discharge status: Home / Self Care | Payer: Self-pay | Source: Home / Self Care | Attending: Internal Medicine

## 2014-09-16 ENCOUNTER — Inpatient Hospital Stay (HOSPITAL_COMMUNITY): Payer: Self-pay | Admitting: Certified Registered"

## 2014-09-16 ENCOUNTER — Inpatient Hospital Stay (HOSPITAL_COMMUNITY)
Admission: EM | Admit: 2014-09-16 | Discharge: 2014-09-18 | DRG: 116 | Disposition: A | Discharge status: Home / Self Care | Payer: Self-pay | Attending: Internal Medicine | Admitting: Internal Medicine

## 2014-09-16 ENCOUNTER — Ambulatory Visit: Payer: Self-pay | Admitting: Ophthalmology

## 2014-09-16 DIAGNOSIS — R4182 Altered mental status, unspecified: Secondary | ICD-10-CM

## 2014-09-16 DIAGNOSIS — H1130 Conjunctival hemorrhage, unspecified eye: Secondary | ICD-10-CM | POA: Diagnosis present

## 2014-09-16 DIAGNOSIS — R410 Disorientation, unspecified: Secondary | ICD-10-CM

## 2014-09-16 DIAGNOSIS — F10129 Alcohol abuse with intoxication, unspecified: Secondary | ICD-10-CM | POA: Diagnosis present

## 2014-09-16 DIAGNOSIS — S01112A Laceration without foreign body of left eyelid and periocular area, initial encounter: Secondary | ICD-10-CM | POA: Diagnosis present

## 2014-09-16 DIAGNOSIS — Z720 Tobacco use: Secondary | ICD-10-CM

## 2014-09-16 DIAGNOSIS — F10920 Alcohol use, unspecified with intoxication, uncomplicated: Secondary | ICD-10-CM

## 2014-09-16 DIAGNOSIS — S0530XA Ocular laceration without prolapse or loss of intraocular tissue, unspecified eye, initial encounter: Secondary | ICD-10-CM | POA: Diagnosis present

## 2014-09-16 DIAGNOSIS — S0532XA Ocular laceration without prolapse or loss of intraocular tissue, left eye, initial encounter: Principal | ICD-10-CM

## 2014-09-16 DIAGNOSIS — W208XXA Other cause of strike by thrown, projected or falling object, initial encounter: Secondary | ICD-10-CM | POA: Diagnosis present

## 2014-09-16 DIAGNOSIS — E872 Acidosis: Secondary | ICD-10-CM | POA: Diagnosis present

## 2014-09-16 DIAGNOSIS — T1490XA Injury, unspecified, initial encounter: Secondary | ICD-10-CM

## 2014-09-16 HISTORY — PX: RUPTURED GLOBE EXPLORATION AND REPAIR: SHX2366

## 2014-09-16 LAB — CBC WITH DIFFERENTIAL/PLATELET
Basophils Absolute: 0 10*3/uL (ref 0.0–0.1)
Basophils Relative: 1 % (ref 0–1)
EOS ABS: 0.2 10*3/uL (ref 0.0–0.7)
Eosinophils Relative: 2 % (ref 0–5)
HCT: 38.5 % (ref 36.0–46.0)
HEMOGLOBIN: 13.7 g/dL (ref 12.0–15.0)
Lymphocytes Relative: 56 % — ABNORMAL HIGH (ref 12–46)
Lymphs Abs: 3.5 10*3/uL (ref 0.7–4.0)
MCH: 32 pg (ref 26.0–34.0)
MCHC: 35.6 g/dL (ref 30.0–36.0)
MCV: 90 fL (ref 78.0–100.0)
MONOS PCT: 7 % (ref 3–12)
Monocytes Absolute: 0.5 10*3/uL (ref 0.1–1.0)
NEUTROS ABS: 2.1 10*3/uL (ref 1.7–7.7)
NEUTROS PCT: 34 % — AB (ref 43–77)
PLATELETS: 124 10*3/uL — AB (ref 150–400)
RBC: 4.28 MIL/uL (ref 3.87–5.11)
RDW: 13.5 % (ref 11.5–15.5)
WBC: 6.3 10*3/uL (ref 4.0–10.5)

## 2014-09-16 LAB — URINALYSIS, ROUTINE W REFLEX MICROSCOPIC
Bilirubin Urine: NEGATIVE
Glucose, UA: NEGATIVE mg/dL
HGB URINE DIPSTICK: NEGATIVE
Ketones, ur: 15 mg/dL — AB
LEUKOCYTES UA: NEGATIVE
Nitrite: NEGATIVE
Protein, ur: NEGATIVE mg/dL
Specific Gravity, Urine: 1.023 (ref 1.005–1.030)
Urobilinogen, UA: 0.2 mg/dL (ref 0.0–1.0)
pH: 5 (ref 5.0–8.0)

## 2014-09-16 LAB — COMPREHENSIVE METABOLIC PANEL
ALT: 27 U/L (ref 0–35)
AST: 33 U/L (ref 0–37)
Albumin: 3.8 g/dL (ref 3.5–5.2)
Alkaline Phosphatase: 66 U/L (ref 39–117)
Anion gap: 18 — ABNORMAL HIGH (ref 5–15)
BILIRUBIN TOTAL: 0.2 mg/dL — AB (ref 0.3–1.2)
BUN: 9 mg/dL (ref 6–23)
CHLORIDE: 106 meq/L (ref 96–112)
CO2: 20 meq/L (ref 19–32)
Calcium: 9.4 mg/dL (ref 8.4–10.5)
Creatinine, Ser: 0.97 mg/dL (ref 0.50–1.10)
GFR calc Af Amer: 77 mL/min — ABNORMAL LOW (ref >90)
GFR calc non Af Amer: 66 mL/min — ABNORMAL LOW (ref >90)
GLUCOSE: 97 mg/dL (ref 70–99)
POTASSIUM: 3.6 meq/L — AB (ref 3.7–5.3)
SODIUM: 144 meq/L (ref 137–147)
TOTAL PROTEIN: 7.7 g/dL (ref 6.0–8.3)

## 2014-09-16 LAB — RAPID URINE DRUG SCREEN, HOSP PERFORMED
AMPHETAMINES: NOT DETECTED
Barbiturates: NOT DETECTED
Benzodiazepines: NOT DETECTED
Cocaine: POSITIVE — AB
OPIATES: NOT DETECTED
Tetrahydrocannabinol: NOT DETECTED

## 2014-09-16 LAB — PHOSPHORUS: PHOSPHORUS: 3 mg/dL (ref 2.3–4.6)

## 2014-09-16 LAB — LACTIC ACID, PLASMA: Lactic Acid, Venous: 2.6 mmol/L — ABNORMAL HIGH (ref 0.5–2.2)

## 2014-09-16 LAB — SURGICAL PCR SCREEN
MRSA, PCR: NEGATIVE
Staphylococcus aureus: POSITIVE — AB

## 2014-09-16 LAB — PROTIME-INR
INR: 1.03 (ref 0.00–1.49)
Prothrombin Time: 13.6 seconds (ref 11.6–15.2)

## 2014-09-16 LAB — ETHANOL: Alcohol, Ethyl (B): 218 mg/dL — ABNORMAL HIGH (ref 0–11)

## 2014-09-16 SURGERY — REPAIR, RUPTURE, GLOBE
Anesthesia: General | Laterality: Left

## 2014-09-16 MED ORDER — HYDROMORPHONE HCL 1 MG/ML IJ SOLN: 0.2500 mg | INTRAMUSCULAR | Status: DC | PRN

## 2014-09-16 MED ORDER — EPHEDRINE SULFATE 50 MG/ML IJ SOLN
INTRAMUSCULAR | Status: DC | PRN
  Administered 2014-09-16: 5 mg via INTRAVENOUS

## 2014-09-16 MED ORDER — THIAMINE HCL 100 MG/ML IJ SOLN
100.0000 mg | Freq: Every day | INTRAMUSCULAR | Status: DC
  Filled 2014-09-16: qty 1

## 2014-09-16 MED ORDER — NEOMYCIN-POLYMYXIN-DEXAMETH 3.5-10000-0.1 OP OINT
TOPICAL_OINTMENT | OPHTHALMIC | Status: DC | PRN
  Administered 2014-09-16: 1 via OPHTHALMIC

## 2014-09-16 MED ORDER — ONDANSETRON HCL 4 MG/2ML IJ SOLN
4.0000 mg | Freq: Once | INTRAMUSCULAR | Status: AC
  Administered 2014-09-16: 4 mg via INTRAVENOUS

## 2014-09-16 MED ORDER — SUCCINYLCHOLINE CHLORIDE 20 MG/ML IJ SOLN
INTRAMUSCULAR | Status: DC | PRN
  Administered 2014-09-16: 100 mg via INTRAVENOUS

## 2014-09-16 MED ORDER — OXYCODONE HCL 5 MG/5ML PO SOLN: 5.0000 mg | Freq: Once | ORAL | Status: DC | PRN

## 2014-09-16 MED ORDER — VITAMIN B-1 100 MG PO TABS: 100.0000 mg | ORAL_TABLET | Freq: Every day | ORAL | Status: DC

## 2014-09-16 MED ORDER — PHENYLEPHRINE HCL 10 MG/ML IJ SOLN
INTRAMUSCULAR | Status: DC | PRN
  Administered 2014-09-16 (×3): 80 ug via INTRAVENOUS

## 2014-09-16 MED ORDER — LACTATED RINGERS IV SOLN
INTRAVENOUS | Status: DC
  Administered 2014-09-16 (×2): via INTRAVENOUS

## 2014-09-16 MED ORDER — FENTANYL CITRATE 0.05 MG/ML IJ SOLN
50.0000 ug | Freq: Once | INTRAMUSCULAR | Status: AC
  Administered 2014-09-16: 50 ug via INTRAVENOUS

## 2014-09-16 MED ORDER — MORPHINE SULFATE 2 MG/ML IJ SOLN
1.0000 mg | INTRAMUSCULAR | Status: DC | PRN
  Administered 2014-09-16 – 2014-09-17 (×4): 1 mg via INTRAVENOUS

## 2014-09-16 MED ORDER — TETANUS-DIPHTH-ACELL PERTUSSIS 5-2.5-18.5 LF-MCG/0.5 IM SUSP
0.5000 mL | Freq: Once | INTRAMUSCULAR | Status: AC
  Administered 2014-09-16: 0.5 mL via INTRAMUSCULAR

## 2014-09-16 MED ORDER — LORAZEPAM 1 MG PO TABS
1.0000 mg | ORAL_TABLET | Freq: Four times a day (QID) | ORAL | Status: DC | PRN
  Administered 2014-09-17 – 2014-09-18 (×2): 1 mg via ORAL
  Filled 2014-09-16 (×2): qty 1

## 2014-09-16 MED ORDER — VITAMIN B-1 100 MG PO TABS
100.0000 mg | ORAL_TABLET | Freq: Every day | ORAL | Status: DC
  Administered 2014-09-17 – 2014-09-18 (×2): 100 mg via ORAL
  Filled 2014-09-16 (×2): qty 1

## 2014-09-16 MED ORDER — LORAZEPAM 2 MG/ML IJ SOLN: 1.0000 mg | Freq: Four times a day (QID) | INTRAMUSCULAR | Status: DC | PRN

## 2014-09-16 MED ORDER — SODIUM CHLORIDE 0.9 % IV SOLN
INTRAVENOUS | Status: DC
  Administered 2014-09-16: 14:00:00 via INTRAVENOUS

## 2014-09-16 MED ORDER — FOLIC ACID 1 MG PO TABS: 1.0000 mg | ORAL_TABLET | Freq: Every day | ORAL | Status: DC

## 2014-09-16 MED ORDER — FOLIC ACID 1 MG PO TABS
1.0000 mg | ORAL_TABLET | Freq: Every day | ORAL | Status: DC
  Administered 2014-09-17 – 2014-09-18 (×2): 1 mg via ORAL
  Filled 2014-09-16 (×2): qty 1

## 2014-09-16 MED ORDER — BSS IO SOLN
INTRAOCULAR | Status: DC | PRN
  Administered 2014-09-16: 15 mL via INTRAOCULAR

## 2014-09-16 MED ORDER — SODIUM CHLORIDE 0.9 % IV SOLN
INTRAVENOUS | Status: DC
  Administered 2014-09-17: 09:00:00 via INTRAVENOUS

## 2014-09-16 MED ORDER — NA CHONDROIT SULF-NA HYALURON 40-30 MG/ML IO SOLN
INTRAOCULAR | Status: DC | PRN
  Administered 2014-09-16 (×2): 0.5 mL via INTRAOCULAR

## 2014-09-16 MED ORDER — OXYCODONE HCL 5 MG PO TABS: 5.0000 mg | ORAL_TABLET | Freq: Once | ORAL | Status: DC | PRN

## 2014-09-16 MED ORDER — FENTANYL CITRATE 0.05 MG/ML IJ SOLN
INTRAMUSCULAR | Status: DC | PRN
  Administered 2014-09-16: 50 ug via INTRAVENOUS

## 2014-09-16 MED ORDER — ADULT MULTIVITAMIN W/MINERALS CH
1.0000 | ORAL_TABLET | Freq: Every day | ORAL | Status: DC
  Administered 2014-09-17 – 2014-09-18 (×2): 1 via ORAL
  Filled 2014-09-16 (×2): qty 1

## 2014-09-16 MED ORDER — PROPOFOL 10 MG/ML IV BOLUS
INTRAVENOUS | Status: DC | PRN
  Administered 2014-09-16: 160 mg via INTRAVENOUS

## 2014-09-16 MED ORDER — ONDANSETRON HCL 4 MG/2ML IJ SOLN
4.0000 mg | Freq: Four times a day (QID) | INTRAMUSCULAR | Status: DC | PRN
  Administered 2014-09-17: 4 mg via INTRAVENOUS

## 2014-09-16 MED ORDER — LIDOCAINE HCL (CARDIAC) 20 MG/ML IV SOLN
INTRAVENOUS | Status: DC | PRN
  Administered 2014-09-16: 100 mg via INTRAVENOUS

## 2014-09-16 MED ORDER — M.V.I. ADULT IV INJ
INJECTION | Freq: Once | INTRAVENOUS | Status: AC
  Administered 2014-09-16: 15:00:00 via INTRAVENOUS
  Filled 2014-09-16: qty 1000

## 2014-09-16 MED ORDER — PROMETHAZINE HCL 25 MG/ML IJ SOLN
6.2500 mg | INTRAMUSCULAR | Status: DC | PRN
  Administered 2014-09-16: 12.5 mg via INTRAVENOUS

## 2014-09-16 MED ORDER — CEFAZOLIN SODIUM-DEXTROSE 2-3 GM-% IV SOLR
2.0000 g | Freq: Once | INTRAVENOUS | Status: AC
  Administered 2014-09-16: 2 g via INTRAVENOUS

## 2014-09-16 MED ORDER — CEFAZOLIN SODIUM-DEXTROSE 2-3 GM-% IV SOLR
INTRAVENOUS | Status: DC | PRN
  Administered 2014-09-16: 2 g via INTRAVENOUS

## 2014-09-16 SURGICAL SUPPLY — 40 items
APPLICATOR COTTON TIP 6IN STRL (MISCELLANEOUS) ×3 IMPLANT
APPLICATOR DR MATTHEWS STRL (MISCELLANEOUS) ×3 IMPLANT
BAG ISOLATION DRAPE 18X18 (DRAPES) IMPLANT
BANDAGE EYE OVAL (MISCELLANEOUS) ×3 IMPLANT
BLADE 10 SAFETY STRL DISP (BLADE) ×3 IMPLANT
BLADE EYE CATARACT 19 1.4 BEAV (BLADE) ×3 IMPLANT
BLADE MVR KNIFE 20G (BLADE) ×3 IMPLANT
CAUTERY EYE LOW TEMP 1300F FIN (OPHTHALMIC RELATED) IMPLANT
CORDS BIPOLAR (ELECTRODE) ×3 IMPLANT
COVER SURGICAL LIGHT HANDLE (MISCELLANEOUS) ×3 IMPLANT
DRAPE ISOLATION BAG 18X18 (DRAPES)
DRAPE OPHTHALMIC 77X100 STRL (CUSTOM PROCEDURE TRAY) ×3 IMPLANT
ERASER HMR WETFIELD 23G BP (MISCELLANEOUS) ×3 IMPLANT
GLOVE BIO SURGEON STRL SZ7 (GLOVE) ×3 IMPLANT
GLOVE BIOGEL PI IND STRL 6.5 (GLOVE) ×1 IMPLANT
GLOVE BIOGEL PI INDICATOR 6.5 (GLOVE) ×2
GLOVE ECLIPSE 6.5 STRL STRAW (GLOVE) ×6 IMPLANT
GLOVE SS BIOGEL STRL SZ 8.5 (GLOVE) ×1 IMPLANT
GLOVE SUPERSENSE BIOGEL SZ 8.5 (GLOVE) ×2
GOWN EXTRA PROTECTION XL (GOWNS) ×3 IMPLANT
GOWN STRL REUS W/ TWL LRG LVL3 (GOWN DISPOSABLE) ×1 IMPLANT
GOWN STRL REUS W/TWL LRG LVL3 (GOWN DISPOSABLE) ×2
KIT ROOM TURNOVER OR (KITS) ×3 IMPLANT
LENS BIOM SUPER VIEW SET DISP (OPHTHALMIC RELATED) ×3 IMPLANT
LIQUID BAND (GAUZE/BANDAGES/DRESSINGS) ×6 IMPLANT
NS IRRIG 1000ML POUR BTL (IV SOLUTION) ×3 IMPLANT
PACK CATARACT CUSTOM (CUSTOM PROCEDURE TRAY) ×3 IMPLANT
PAD ARMBOARD 7.5X6 YLW CONV (MISCELLANEOUS) ×6 IMPLANT
ROLLS DENTAL (MISCELLANEOUS) ×6 IMPLANT
SPECIMEN JAR SMALL (MISCELLANEOUS) IMPLANT
STOCKINETTE IMPERVIOUS 9X36 MD (GAUZE/BANDAGES/DRESSINGS) ×6 IMPLANT
SUT ETHILON 10 0 CS140 6 (SUTURE) ×6 IMPLANT
SUT ETHILON 8 0 TG100 8 (SUTURE) ×3 IMPLANT
SUT SILK 4 0 C 3 735G (SUTURE) IMPLANT
SUT SILK 6 0 G 6 (SUTURE) ×3 IMPLANT
SUT VICRYL 7 0 TG140 8 (SUTURE) ×3 IMPLANT
TAPE PAPER 2X10 WHT MICROPORE (GAUZE/BANDAGES/DRESSINGS) ×3 IMPLANT
TOWEL OR 17X24 6PK STRL BLUE (TOWEL DISPOSABLE) ×6 IMPLANT
WATER STERILE IRR 1000ML POUR (IV SOLUTION) ×3 IMPLANT
WIPE INSTRUMENT VISIWIPE 73X73 (MISCELLANEOUS) ×3 IMPLANT
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
## 2014-09-16 NOTE — ED Notes (Signed)
Returned from Ct scan  
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
## 2014-09-16 NOTE — ED Notes (Signed)
Patient transported to CT 

## 2014-09-16 NOTE — Transfer of Care (Signed)
Immediate Anesthesia Transfer of Care Note  Patient: Janet Ramos  Procedure(s) Performed: Procedure(s):  Ruptured Globe Repair Left Eye (Left)  Patient Location: PACU  Anesthesia Type:General  Level of Consciousness: awake, alert  and patient cooperative  Airway & Oxygen Therapy: Patient Spontanous Breathing  Post-op Assessment: Report given to PACU RN and Post -op Vital signs reviewed and stable  Post vital signs: Reviewed and stable  Complications: No apparent anesthesia complications
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
## 2014-09-16 NOTE — Anesthesia Preprocedure Evaluation (Addendum)
Anesthesia Evaluation  Patient identified by MRN, date of birth, ID band Patient awake    Reviewed: Allergy & Precautions, H&P , NPO status , Patient's Chart, lab work & pertinent test results  Airway Mallampati: III  TM Distance: >3 FB     Dental  (+) Teeth Intact, Poor Dentition, Dental Advisory Given   Pulmonary Current Smoker,  breath sounds clear to auscultation        Cardiovascular Rhythm:Regular Rate:Normal     Neuro/Psych    GI/Hepatic (+)     substance abuse  alcohol use and cocaine use,   Endo/Other    Renal/GU      Musculoskeletal   Abdominal   Peds  Hematology   Anesthesia Other Findings   Reproductive/Obstetrics                           Anesthesia Physical Anesthesia Plan  ASA: II and emergent  Anesthesia Plan: General   Post-op Pain Management:    Induction: Intravenous, Cricoid pressure planned and Rapid sequence  Airway Management Planned: Oral ETT  Additional Equipment:   Intra-op Plan:   Post-operative Plan: Extubation in OR  Informed Consent: I have reviewed the patients History and Physical, chart, labs and discussed the procedure including the risks, benefits and alternatives for the proposed anesthesia with the patient or authorized representative who has indicated his/her understanding and acceptance.   Dental advisory given  Plan Discussed with: CRNA and Surgeon  Anesthesia Plan Comments:         Anesthesia Quick Evaluation

## 2014-09-16 NOTE — ED Notes (Signed)
Pt resting at this time zofran given for nausea

## 2014-09-16 NOTE — Op Note (Signed)
09/16/2014  10:31 PM  PATIENT:  Janet Ramos    PRE-OPERATIVE DIAGNOSIS:  Ruptured Globe Left Eye  POST-OPERATIVE DIAGNOSIS:  Same  PROCEDURE:   Ruptured Globe Repair Left Eye  SURGEON:  Lamonte Sakai, MD  ANESTHESIA:   General  PREOPERATIVE INDICATIONS:  Janet Ramos is a  53 y.o. female with a diagnosis of a ruptured globe of the left eye. Closure of the globe is indicated to prevent infection and attempt salvage of both vision and the globe. The risks benefits and alternatives were discussed with the patient preoperatively including but not limited to the risks of infection, bleeding, nerve injury, cardiopulmonary complications, the need for revision surgery, among others, and the patient was willing to proceed. As Janet Ramos was not entirely lucid for the consent process, we additionally obtained consent from her mother by phone.  DESCRIPTION OF PROCEDURE:  After routine preoperative evaluation including informed consent from the patient and her parent, the patient was taken to the operating room where She was identified by me. Time out was performed by staff and all present in the room were in agreement. General anesthesia was induced without difficulty after placement of appropriate monitors. A second IV dose of Ancef was provided for antibiotic coverage.   The left eye was prepped and draped in the usual sterile ophthalmic fashion, taking great care not to place any unnecessary pressure on the left globe. The left globe was carefully inspected. A laceration bisected the superior third of the cornea from limbus to limbus, with a vertical laceration perpendicular from laceration to limbus at approximately the 1 o'clock hour. Seven large eyelashes were removed from within the superficial wound. Uveal tissue and retina were readily identified prolapsing from the wound, in addition to vitreous. A limbal peritomy was made from the 10 to the 5 o'clock hour and the conjunctiva recessed to evaluate  for extension into the sclera as well as perforations posterior to the extraocular muscles; none were identified. A Wechsel sponge was used to lift vitreous from the surface of the eye without traction and Vannas scissors were used to cut vitreous flush with the globe, taking care not to cut retina or uveal tissue. A paracentesis was made at the 7 o'clock hour and Viscoat was injected into the anterior chamber to posteriorly displace hemorrhage and uveal tissue.  Closure was then undertaken, beginning with 10-0 nylon sutures and zippering from the limbus to center while taking care not to encarcerate posterior structures in the wound, using Viscoat and an iris spatula to this end as necessary. When the wound was nearly tight, limbal 8-0 nylon sutures were used to reinforce the peripheral sites of the wound. Finally, 10-0 sutures were placed to close the Y-center of the laceration. Sutures were then rotated and buried. Loose sutures were removed and replaced with tight ones. The globe was rotated and explored for further lacerations; none were found. An iris spatula was used to finally sweep the wound to ensure no encarceration of tissue.  Three sutures were unable to be rotated into the stroma, despite significant effort on the part of the surgeon including obtaining a different set of tiers for improved traction on the suture. These were thusly rotated with the knots to the periphery of the eye. Two of these were able to be buried beneath the conjunctival/limbal junction as they were peripheral. Conjunctiva was reapproximated at the limbus with 8-0 suture, taking care to cover the ends of the unburied sutures.   At the end  of the case, the eye was pressurized and the wounds secure and free of encarceration. The drapes and speculum were removed and the eye was cleaned. Tobradex eye ointment was placed in the operative eye. The eye was patched and shielded for overnight protection; this is to be left in place  until the ophthalmologist removes it on postoperative day 1. The patient was awakened without difficulty and taken to the recovery room in stable condition, having suffered no intraoperative or immediate postoperative complications.  Janet Ramos  Complications:  None immediate

## 2014-09-16 NOTE — ED Notes (Signed)
The lens of the eye placed in a specimen cup , pt also has a small abrasion to the bridge of her nose , pt is alert to self confused to place , pt knows the month but will not state the year , pt smells of etoh and admits to drinking
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
## 2014-09-16 NOTE — Anesthesia Procedure Notes (Addendum)
Procedure Name: Intubation Date/Time: 09/16/2014 7:32 PM Performed by: Manuela Schwartz B Pre-anesthesia Checklist: Patient identified, Emergency Drugs available, Suction available, Patient being monitored and Timeout performed Patient Re-evaluated:Patient Re-evaluated prior to inductionOxygen Delivery Method: Circle system utilized Preoxygenation: Pre-oxygenation with 100% oxygen Intubation Type: IV induction and Rapid sequence Laryngoscope Size: Mac and 3 Grade View: Grade I Tube type: Oral Number of attempts: 1 Airway Equipment and Method: Stylet Placement Confirmation: ETT inserted through vocal cords under direct vision,  positive ETCO2 and breath sounds checked- equal and bilateral Secured at: 22 cm Tube secured with: Tape Dental Injury: Teeth and Oropharynx as per pre-operative assessment
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
## 2014-09-16 NOTE — ED Notes (Signed)
Report Given

## 2014-09-17 ENCOUNTER — Encounter (HOSPITAL_COMMUNITY): Payer: Self-pay | Admitting: *Deleted

## 2014-09-17 LAB — CBC WITH DIFFERENTIAL/PLATELET
Basophils Absolute: 0 10*3/uL (ref 0.0–0.1)
Basophils Relative: 0 % (ref 0–1)
EOS ABS: 0 10*3/uL (ref 0.0–0.7)
EOS PCT: 0 % (ref 0–5)
HCT: 35.4 % — ABNORMAL LOW (ref 36.0–46.0)
Hemoglobin: 12.5 g/dL (ref 12.0–15.0)
LYMPHS ABS: 2.4 10*3/uL (ref 0.7–4.0)
Lymphocytes Relative: 31 % (ref 12–46)
MCH: 31.6 pg (ref 26.0–34.0)
MCHC: 35.3 g/dL (ref 30.0–36.0)
MCV: 89.6 fL (ref 78.0–100.0)
MONO ABS: 0.7 10*3/uL (ref 0.1–1.0)
Monocytes Relative: 9 % (ref 3–12)
Neutro Abs: 4.6 10*3/uL (ref 1.7–7.7)
Neutrophils Relative %: 60 % (ref 43–77)
PLATELETS: 121 10*3/uL — AB (ref 150–400)
RBC: 3.95 MIL/uL (ref 3.87–5.11)
RDW: 13.5 % (ref 11.5–15.5)
WBC: 7.7 10*3/uL (ref 4.0–10.5)

## 2014-09-17 LAB — HEPATITIS PANEL, ACUTE
HCV AB: NEGATIVE
HEP A IGM: NONREACTIVE
Hep B C IgM: NONREACTIVE
Hepatitis B Surface Ag: NEGATIVE

## 2014-09-17 LAB — BASIC METABOLIC PANEL
Anion gap: 16 — ABNORMAL HIGH (ref 5–15)
BUN: 7 mg/dL (ref 6–23)
CO2: 21 mEq/L (ref 19–32)
CREATININE: 0.68 mg/dL (ref 0.50–1.10)
Calcium: 9.3 mg/dL (ref 8.4–10.5)
Chloride: 101 mEq/L (ref 96–112)
GFR calc Af Amer: >90 mL/min (ref >90)
Glucose, Bld: 90 mg/dL (ref 70–99)
Potassium: 4.1 mEq/L (ref 3.7–5.3)
Sodium: 138 mEq/L (ref 137–147)

## 2014-09-17 LAB — HIV ANTIBODY (ROUTINE TESTING W REFLEX): HIV 1&2 Ab, 4th Generation: NONREACTIVE

## 2014-09-17 LAB — MAGNESIUM: Magnesium: 1.8 mg/dL (ref 1.5–2.5)

## 2014-09-17 LAB — LACTIC ACID, PLASMA: Lactic Acid, Venous: 0.8 mmol/L (ref 0.5–2.2)

## 2014-09-17 MED ORDER — OXYCODONE-ACETAMINOPHEN 5-325 MG PO TABS
1.0000 | ORAL_TABLET | Freq: Four times a day (QID) | ORAL | Status: DC | PRN
  Administered 2014-09-17 – 2014-09-18 (×3): 2 via ORAL
  Filled 2014-09-17 (×3): qty 2

## 2014-09-17 MED ORDER — OFLOXACIN 0.3 % OP SOLN: 1.0000 [drp] | Freq: Four times a day (QID) | OPHTHALMIC | Status: DC

## 2014-09-17 MED ORDER — PREDNISOLONE ACETATE 1 % OP SUSP
1.0000 [drp] | Freq: Four times a day (QID) | OPHTHALMIC | Status: DC
  Administered 2014-09-17 – 2014-09-18 (×5): 1 [drp] via OPHTHALMIC
  Filled 2014-09-17: qty 1
  Filled 2014-09-17: qty 5

## 2014-09-17 MED ORDER — NEOMYCIN-POLYMYXIN-DEXAMETH 3.5-10000-0.1 OP OINT
TOPICAL_OINTMENT | Freq: Four times a day (QID) | OPHTHALMIC | Status: DC | PRN
  Administered 2014-09-17 – 2014-09-18 (×3): 1 via OPHTHALMIC
  Filled 2014-09-17: qty 3.5

## 2014-09-17 MED ORDER — DIPHENHYDRAMINE HCL 25 MG PO CAPS
25.0000 mg | ORAL_CAPSULE | Freq: Once | ORAL | Status: AC
  Administered 2014-09-17: 25 mg via ORAL
  Filled 2014-09-17 (×2): qty 1

## 2014-09-17 MED ORDER — CHLORHEXIDINE GLUCONATE CLOTH 2 % EX PADS
6.0000 | MEDICATED_PAD | Freq: Every day | CUTANEOUS | Status: DC
  Administered 2014-09-17: 6 via TOPICAL

## 2014-09-17 MED ORDER — PREDNISOLONE ACETATE 1 % OP SUSP: 1.0000 [drp] | Freq: Four times a day (QID) | OPHTHALMIC | Status: DC

## 2014-09-17 MED ORDER — ACETAMINOPHEN 325 MG PO TABS: 650.0000 mg | ORAL_TABLET | Freq: Four times a day (QID) | ORAL | Status: DC | PRN

## 2014-09-17 MED ORDER — MUPIROCIN 2 % EX OINT
1.0000 "application " | TOPICAL_OINTMENT | Freq: Two times a day (BID) | CUTANEOUS | Status: DC
  Administered 2014-09-17 (×3): 1 via NASAL

## 2014-09-17 MED ORDER — OFLOXACIN 0.3 % OP SOLN
1.0000 [drp] | Freq: Four times a day (QID) | OPHTHALMIC | Status: DC
  Administered 2014-09-17 – 2014-09-18 (×5): 1 [drp] via OPHTHALMIC
  Filled 2014-09-17: qty 5

## 2014-09-17 MED ORDER — OXYCODONE-ACETAMINOPHEN 5-325 MG PO TABS
1.0000 | ORAL_TABLET | Freq: Four times a day (QID) | ORAL | Status: DC | PRN
  Administered 2014-09-17 (×2): 1 via ORAL

## 2014-09-17 NOTE — Progress Notes (Signed)
Wasted 1 mg of Morphine w/ N. Danise Mina, Therapist, sports.
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
## 2014-09-17 NOTE — Anesthesia Postprocedure Evaluation (Signed)
  Anesthesia Post-op Note  Patient: Janet Ramos  Procedure(s) Performed: Procedure(s):  Ruptured Globe Repair Left Eye (Left)  Patient Location: PACU  Anesthesia Type:General  Level of Consciousness: awake, alert  and sedated  Airway and Oxygen Therapy: Patient Spontanous Breathing  Post-op Pain: mild  Post-op Assessment: Post-op Vital signs reviewed  Post-op Vital Signs: stable  Last Vitals:  Filed Vitals:   09/17/14 0524  BP: 132/78  Pulse: 76  Temp: 37.1 C  Resp: 16    Complications: No apparent anesthesia complications
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
## 2014-09-18 MED ORDER — OFLOXACIN 0.3 % OP SOLN: 1.0000 [drp] | Freq: Four times a day (QID) | OPHTHALMIC | Status: DC

## 2014-09-18 MED ORDER — NEOMYCIN-POLYMYXIN-DEXAMETH 3.5-10000-0.1 OP OINT: 1.0000 "application " | TOPICAL_OINTMENT | Freq: Four times a day (QID) | OPHTHALMIC | Status: DC | PRN

## 2014-09-18 MED ORDER — INFLUENZA VAC SPLIT QUAD 0.5 ML IM SUSY: 0.5000 mL | PREFILLED_SYRINGE | INTRAMUSCULAR | Status: DC

## 2014-09-18 MED ORDER — PREDNISOLONE ACETATE 1 % OP SUSP: 1.0000 [drp] | Freq: Four times a day (QID) | OPHTHALMIC | Status: DC

## 2014-09-18 MED ORDER — OXYCODONE-ACETAMINOPHEN 5-325 MG PO TABS: 1.0000 | ORAL_TABLET | Freq: Four times a day (QID) | ORAL | Status: DC | PRN

## 2014-09-18 NOTE — Progress Notes (Signed)
Nurse called about specimen cup that was found outside of patient's room after she had been discharged. Nurse states there were pieces of tissue in the specimen cup. Nurse reports she talked with Dr. Posey Pronto who did not need the specimen. I could not find any need to keep the specimen after looking at resident's note from today. I instructed the nurse to dispose of the specimen.   Albin Felling, MD Internal Medicine Resident, PGY-1
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
## 2014-09-18 NOTE — Care Management Note (Signed)
    Page 1 of 1   09/18/2014     11:14:28 AM CARE MANAGEMENT NOTE 09/18/2014  Patient:  Janet Ramos, Janet Ramos   Account Number:  [id number]  Date Initiated:  09/18/2014  Documentation initiated by:  [facility]  Subjective/Objective Assessment:   adm: Trichimoniasis      Action/Plan:   discharge planning   Anticipated DC Date:  09/18/2014   Anticipated DC Plan:  [place]  CM consult  [facility]      Choice offered to / List presented to:             Status of service:  Completed, signed off Medicare Important Message given?   (If response is "NO", the following Medicare IM given date fields will be blank) Date Medicare IM given:   Medicare IM given by:   Date Additional Medicare IM given:   Additional Medicare IM given by:    Discharge Disposition:  [place]  Per UR Regulation:    If discussed at Long Length of Stay Meetings, dates discussed:    Comments:  09/18/14 10:30 CM met with pt in room and gave pt [facility] letter with list of participating pharmacies.  Pt verbalized understanding of MATCH parameters.  CM also gave pt [facility] pamphlet and pt verbalized understanding she will go to the clinic any weekday morning this week and ask for; AN APPOINTMENT TO SECURE A PCP; AN APPOINTMENT WITH A NAVIGATOR TO GET INSURANCE THROUGH THE AFFORDABLE CARE ACT; AN APPOINTMENT FOR FOLLOW UP MEDICAL CARE.    No other CM needs were communicated.  Mariane Masters, BSN, CM [phone].

## 2014-09-18 NOTE — Progress Notes (Signed)
Seen by Education officer, museum and Case Management. IVs removed. Discharge instructions and prescriptions given along with paperwork for [place]. Discharged via wheelchair to family's care with NT present. Melford Aase, RN
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
## 2014-09-19 ENCOUNTER — Encounter (HOSPITAL_COMMUNITY): Payer: Self-pay | Admitting: Ophthalmology

## 2014-09-23 HISTORY — PX: ENUCLEATION: SHX628

## 2015-03-08 ENCOUNTER — Encounter (HOSPITAL_COMMUNITY): Payer: Self-pay | Admitting: Emergency Medicine

## 2015-03-08 ENCOUNTER — Emergency Department (HOSPITAL_COMMUNITY)
Admission: EM | Admit: 2015-03-08 | Discharge: 2015-03-08 | Disposition: A | Discharge status: Home / Self Care | Payer: Self-pay | Attending: Emergency Medicine | Admitting: Emergency Medicine

## 2015-03-08 DIAGNOSIS — Y9289 Other specified places as the place of occurrence of the external cause: Secondary | ICD-10-CM | POA: Insufficient documentation

## 2015-03-08 DIAGNOSIS — X58XXXA Exposure to other specified factors, initial encounter: Secondary | ICD-10-CM | POA: Insufficient documentation

## 2015-03-08 DIAGNOSIS — Y990 Civilian activity done for income or pay: Secondary | ICD-10-CM | POA: Insufficient documentation

## 2015-03-08 DIAGNOSIS — S8992XA Unspecified injury of left lower leg, initial encounter: Secondary | ICD-10-CM | POA: Insufficient documentation

## 2015-03-08 DIAGNOSIS — Y9389 Activity, other specified: Secondary | ICD-10-CM | POA: Insufficient documentation

## 2015-03-08 DIAGNOSIS — Z720 Tobacco use: Secondary | ICD-10-CM | POA: Insufficient documentation

## 2015-03-08 DIAGNOSIS — Z7952 Long term (current) use of systemic steroids: Secondary | ICD-10-CM | POA: Insufficient documentation

## 2015-03-08 DIAGNOSIS — M25562 Pain in left knee: Secondary | ICD-10-CM

## 2015-03-08 DIAGNOSIS — Z8619 Personal history of other infectious and parasitic diseases: Secondary | ICD-10-CM | POA: Insufficient documentation

## 2015-03-08 DIAGNOSIS — Z792 Long term (current) use of antibiotics: Secondary | ICD-10-CM | POA: Insufficient documentation

## 2015-03-08 MED ORDER — HYDROCODONE-ACETAMINOPHEN 5-325 MG PO TABS
1.0000 | ORAL_TABLET | Freq: Once | ORAL | Status: AC
Start: 2015-03-08 — End: 2015-03-08
  Administered 2015-03-08: 1 via ORAL
  Filled 2015-03-08: qty 1

## 2015-03-08 MED ORDER — IBUPROFEN 800 MG PO TABS: 800.0000 mg | ORAL_TABLET | Freq: Three times a day (TID) | ORAL | Status: DC

## 2015-03-08 MED ORDER — IBUPROFEN 800 MG PO TABS
800.0000 mg | ORAL_TABLET | Freq: Once | ORAL | Status: AC
  Administered 2015-03-08: 800 mg via ORAL
  Filled 2015-03-08: qty 1

## 2015-03-08 MED ORDER — HYDROCODONE-ACETAMINOPHEN 5-325 MG PO TABS: 1.0000 | ORAL_TABLET | ORAL | Status: DC | PRN

## 2015-03-08 NOTE — Discharge Instructions (Signed)
Cryotherapy °Cryotherapy means treatment with cold. Ice or gel packs can be used to reduce both pain and swelling. Ice is the most helpful within the first 24 to 48 hours after an injury or flare-up from overusing a muscle or joint. Sprains, strains, spasms, burning pain, shooting pain, and aches can all be eased with ice. Ice can also be used when recovering from surgery. Ice is effective, has very few side effects, and is safe for most people to use. °PRECAUTIONS  °Ice is not a safe treatment option for people with: °· Raynaud phenomenon. This is a condition affecting small blood vessels in the extremities. Exposure to cold may cause your problems to return. °· Cold hypersensitivity. There are many forms of cold hypersensitivity, including: °· Cold urticaria. Red, itchy hives appear on the skin when the tissues begin to warm after being iced. °· Cold erythema. This is a red, itchy rash caused by exposure to cold. °· Cold hemoglobinuria. Red blood cells break down when the tissues begin to warm after being iced. The hemoglobin that carry oxygen are passed into the urine because they cannot combine with blood proteins fast enough. °· Numbness or altered sensitivity in the area being iced. °If you have any of the following conditions, do not use ice until you have discussed cryotherapy with your caregiver: °· Heart conditions, such as arrhythmia, angina, or chronic heart disease. °· High blood pressure. °· Healing wounds or open skin in the area being iced. °· Current infections. °· Rheumatoid arthritis. °· Poor circulation. °· Diabetes. °Ice slows the blood flow in the region it is applied. This is beneficial when trying to stop inflamed tissues from spreading irritating chemicals to surrounding tissues. However, if you expose your skin to cold temperatures for too long or without the proper protection, you can damage your skin or nerves. Watch for signs of skin damage due to cold. °HOME CARE INSTRUCTIONS °Follow  these tips to use ice and cold packs safely. °· Place a dry or damp towel between the ice and skin. A damp towel will cool the skin more quickly, so you may need to shorten the time that the ice is used. °· For a more rapid response, add gentle compression to the ice. °· Ice for no more than 10 to 20 minutes at a time. The bonier the area you are icing, the less time it will take to get the benefits of ice. °· Check your skin after 5 minutes to make sure there are no signs of a poor response to cold or skin damage. °· Rest 20 minutes or more between uses. °· Once your skin is numb, you can end your treatment. You can test numbness by very lightly touching your skin. The touch should be so light that you do not see the skin dimple from the pressure of your fingertip. When using ice, most people will feel these normal sensations in this order: cold, burning, aching, and numbness. °· Do not use ice on someone who cannot communicate their responses to pain, such as small children or people with dementia. °HOW TO MAKE AN ICE PACK °Ice packs are the most common way to use ice therapy. Other methods include ice massage, ice baths, and cryosprays. Muscle creams that cause a cold, tingly feeling do not offer the same benefits that ice offers and should not be used as a substitute unless recommended by your caregiver. °To make an ice pack, do one of the following: °· Place crushed ice or a   bag of frozen vegetables in a sealable plastic bag. Squeeze out the excess air. Place this bag inside another plastic bag. Slide the bag into a pillowcase or place a damp towel between your skin and the bag.  Mix 3 parts water with 1 part rubbing alcohol. Freeze the mixture in a sealable plastic bag. When you remove the mixture from the freezer, it will be slushy. Squeeze out the excess air. Place this bag inside another plastic bag. Slide the bag into a pillowcase or place a damp towel between your skin and the bag. SEEK MEDICAL CARE  IF:  You develop white spots on your skin. This may give the skin a blotchy (mottled) appearance.  Your skin turns blue or pale.  Your skin becomes waxy or hard.  Your swelling gets worse. MAKE SURE YOU:   Understand these instructions.  Will watch your condition.  Will get help right away if you are not doing well or get worse. Document Released: 05/27/2011 Document Revised: 02/14/2014 Document Reviewed: 05/27/2011 [facility] Patient Information 2015 [place], [place]. This information is not intended to replace advice given to you by your health care provider. Make sure you discuss any questions you have with your health care provider. Knee Sprain A knee sprain is a tear in one of the strong, fibrous tissues that connect the bones (ligaments) in your knee. The severity of the sprain depends on how much of the ligament is torn. The tear can be either partial or complete. CAUSES  Often, sprains are a result of a fall or injury. The force of the impact causes the fibers of your ligament to stretch too much. This excess tension causes the fibers of your ligament to tear. SIGNS AND SYMPTOMS  You may have some loss of motion in your knee. Other symptoms include:  Bruising.  Pain in the knee area.  Tenderness of the knee to the touch.  Swelling. DIAGNOSIS  To diagnose a knee sprain, your health care provider will physically examine your knee. Your health care provider may also suggest an X-ray exam of your knee to make sure no bones are broken. TREATMENT  If your ligament is only partially torn, treatment usually involves keeping the knee in a fixed position (immobilization) or bracing your knee for activities that require movement for several weeks. To do this, your health care provider will apply a bandage, cast, or splint to keep your knee from moving and to support your knee during movement until it heals. For a partially torn ligament, the healing process usually takes 4-6 weeks. If  your ligament is completely torn, depending on which ligament it is, you may need surgery to reconnect the ligament to the bone or reconstruct it. After surgery, a cast or splint may be applied and will need to stay on your knee for 4-6 weeks while your ligament heals. HOME CARE INSTRUCTIONS  Keep your injured knee elevated to decrease swelling.  To ease pain and swelling, apply ice to the injured area:  Put ice in a plastic bag.  Place a towel between your skin and the bag.  Leave the ice on for 20 minutes, 2-3 times a day.  Only take medicine for pain as directed by your health care provider.  Do not leave your knee unprotected until pain and stiffness go away (usually 4-6 weeks).  If you have a cast or splint, do not allow it to get wet. If you have been instructed not to remove it, cover it with a plastic  bag when you shower or bathe. Do not swim.  Your health care provider may suggest exercises for you to do during your recovery to prevent or limit permanent weakness and stiffness. SEEK IMMEDIATE MEDICAL CARE IF:  Your cast or splint becomes damaged.  Your pain becomes worse.  You have significant pain, swelling, or numbness below the cast or splint. MAKE SURE YOU:  Understand these instructions.  Will watch your condition.  Will get help right away if you are not doing well or get worse. Document Released: 09/30/2005 Document Revised: 07/21/2013 Document Reviewed: 05/12/2013 [facility] Patient Information 2015 [place], [place]. This information is not intended to replace advice given to you by your health care provider. Make sure you discuss any questions you have with your health care provider.

## 2015-03-08 NOTE — ED Provider Notes (Signed)
CSN: [id number]     Arrival date & time 03/08/15  2017 History  This chart was scribed for non-physician provider Charlann Lange, PA-C, working with Malvin Johns, MD by Irene Pap, ED Scribe. This patient was seen in room WTR8/WTR8 and patient care was started at 9:10 PM.    Chief Complaint  Patient presents with  . Knee Pain   The history is provided by the patient. No language interpreter was used.    HPI Comments: Janet Ramos is a 54 y.o. female who presents to the Emergency Department complaining of left knee pain onset one week ago. Patient states that she twisted her ankle at work last weekend and is now experiencing progressively worsening pain and swelling in the knee. She reports most pain to the front of the knee. She reports a prior injury to the knee 5 years ago. She states that she took 2 Advil 4 hours ago to no relief. She denies any other symptoms or allergies to medications  Past Medical History  Diagnosis Date  . Trichimoniasis 2010   Past Surgical History  Procedure Laterality Date  . Cesarean section  1990  . Tubaligation  1994  . Ruptured globe exploration and repair Left 09/16/2014    Procedure:  Ruptured Globe Repair Left Eye;  Surgeon: Lamonte Sakai, MD;  Location: [facility];  Service: Ophthalmology;  Laterality: Left;   Family History  Problem Relation Age of Onset  . Hypertension Other   . Cancer Other   . CAD Other    History  Substance Use Topics  . Smoking status: Current Every Day Smoker -- 0.50 packs/day  . Smokeless tobacco: Not on file  . Alcohol Use: Yes     Comment: occas   OB History    No data available     Review of Systems  Cardiovascular: Negative for chest pain.  Gastrointestinal: Negative for abdominal pain.  Musculoskeletal: Positive for joint swelling and arthralgias.  Neurological: Negative for weakness and numbness.   Allergies  Review of patient's allergies indicates no known allergies.  Home Medications   Prior to  Admission medications   Medication Sig Start Date End Date Taking? Authorizing Provider  neomycin-polymyxin b-dexamethasone (MAXITROL) 3.5-10000-0.1 OINT Place 1 application into the left eye 4 (four) times daily as needed (eye irritation). 09/18/14   Jones Bales, MD  ofloxacin (OCUFLOX) 0.3 % ophthalmic solution Place 1 drop into the left eye 4 (four) times daily. 09/18/14   Jones Bales, MD  oxyCODONE-acetaminophen (PERCOCET/ROXICET) 5-325 MG per tablet Take 1 tablet by mouth every 6 (six) hours as needed for moderate pain. 09/18/14   Jones Bales, MD  prednisoLONE acetate (PRED FORTE) 1 % ophthalmic suspension Place 1 drop into the left eye 4 (four) times daily. 09/18/14   Jones Bales, MD   BP 141/100 mmHg  Pulse 82  Temp(Src) 98.5 F (36.9 C) (Oral)  Resp 22  Ht 5\' 4"  (1.626 m)  Wt 180 lb (81.647 kg)  BMI 30.88 kg/m2  SpO2 99%  LMP 03/15/2011   Physical Exam  Constitutional: She is oriented to person, place, and time. She appears well-developed and well-nourished. No distress.  HENT:  Head: Normocephalic and atraumatic.  Mouth/Throat: Oropharynx is clear and moist.  Eyes: Conjunctivae and EOM are normal.  Neck: Normal range of motion. Neck supple.  Cardiovascular: Normal rate, regular rhythm, normal heart sounds and intact distal pulses.   Pulmonary/Chest: Effort normal and breath sounds normal. No respiratory distress.  Musculoskeletal: Normal  range of motion. She exhibits no edema.  Left knee with moderate swelling; joint is stable; there is pain with meniscal maneuvers; no calf or thigh tenderness  Neurological: She is alert and oriented to person, place, and time. No sensory deficit.  Skin: Skin is warm and dry.  Psychiatric: She has a normal mood and affect. Her behavior is normal.  Nursing note and vitals reviewed.   ED Course  Procedures (including critical care time) DIAGNOSTIC STUDIES: Oxygen Saturation is 99% on room air, normal by my interpretation.     COORDINATION OF CARE: 9:12 PM-Discussed treatment plan which includes increased dose of ibuprofen, knee immobilizer, crutches, RICE techniques and pain medication with pt at bedside and pt agreed to plan. Discussed follow up with orthopedist if pain worsens; patient states that she is not driving.   Labs Review Labs Reviewed - No data to display  Imaging Review No results found.   EKG Interpretation None      MDM   Final diagnoses:  None    1. Left knee pain  Uncomplicated left knee strain requiring supportive care.  I personally performed the services described in this documentation, which was scribed in my presence. The recorded information has been reviewed and is accurate.     Charlann Lange, PA-C 03/10/15 1021  Malvin Johns, MD 03/13/15 [phone]

## 2015-03-08 NOTE — ED Notes (Signed)
Pt states her left knee is painful and swollen  Pt denies injury to her knee but states she did twist her ankle  Pt states she noticed the swelling last week and it has progressively gotten worse

## 2015-04-02 ENCOUNTER — Encounter (HOSPITAL_COMMUNITY): Payer: Self-pay | Admitting: Emergency Medicine

## 2015-04-02 ENCOUNTER — Emergency Department (HOSPITAL_COMMUNITY)
Admission: EM | Admit: 2015-04-02 | Discharge: 2015-04-03 | Disposition: A | Discharge status: Home / Self Care | Payer: Self-pay | Attending: Emergency Medicine | Admitting: Emergency Medicine

## 2015-04-02 DIAGNOSIS — H05012 Cellulitis of left orbit: Secondary | ICD-10-CM | POA: Insufficient documentation

## 2015-04-02 DIAGNOSIS — Z8619 Personal history of other infectious and parasitic diseases: Secondary | ICD-10-CM | POA: Insufficient documentation

## 2015-04-02 DIAGNOSIS — L03213 Periorbital cellulitis: Secondary | ICD-10-CM

## 2015-04-02 DIAGNOSIS — Z720 Tobacco use: Secondary | ICD-10-CM | POA: Insufficient documentation

## 2015-04-02 LAB — CBC WITH DIFFERENTIAL/PLATELET
Basophils Absolute: 0 10*3/uL (ref 0.0–0.1)
Basophils Relative: 0 % (ref 0–1)
Eosinophils Absolute: 0.2 10*3/uL (ref 0.0–0.7)
Eosinophils Relative: 3 % (ref 0–5)
HCT: 39.8 % (ref 36.0–46.0)
Hemoglobin: 14.4 g/dL (ref 12.0–15.0)
Lymphocytes Relative: 45 % (ref 12–46)
Lymphs Abs: 3.1 10*3/uL (ref 0.7–4.0)
MCH: 32.4 pg (ref 26.0–34.0)
MCHC: 36.2 g/dL — ABNORMAL HIGH (ref 30.0–36.0)
MCV: 89.4 fL (ref 78.0–100.0)
Monocytes Absolute: 0.6 10*3/uL (ref 0.1–1.0)
Monocytes Relative: 9 % (ref 3–12)
Neutro Abs: 2.9 10*3/uL (ref 1.7–7.7)
Neutrophils Relative %: 43 % (ref 43–77)
Platelets: 132 10*3/uL — ABNORMAL LOW (ref 150–400)
RBC: 4.45 MIL/uL (ref 3.87–5.11)
RDW: 13.4 % (ref 11.5–15.5)
WBC: 6.7 10*3/uL (ref 4.0–10.5)

## 2015-04-02 MED ORDER — OXYCODONE-ACETAMINOPHEN 5-325 MG PO TABS
2.0000 | ORAL_TABLET | Freq: Once | ORAL | Status: AC
  Administered 2015-04-02: 2 via ORAL
  Filled 2015-04-02: qty 2

## 2015-04-02 NOTE — ED Notes (Signed)
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 This is the first time she has had problems with it. She reports it as a "scratchy/itchy feeling".

## 2015-04-02 NOTE — ED Notes (Signed)
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 She states that yesterday she began feeling pain and swelling in her eye and today the swelling and pain have increased. She took 1 advil (200mg ) around 4pm and this did not decrease her pain.

## 2015-04-02 NOTE — ED Provider Notes (Signed)
CSN: [id number]     Arrival date & time 04/02/15  2129 History   First MD Initiated Contact with Patient 04/02/15 2144     Chief Complaint  Patient presents with  . Eye Problem     (Consider location/radiation/quality/duration/timing/severity/associated sxs/prior Treatment) Patient is a 54 y.o. female presenting with eye problem.  Eye Problem Location:  L eye Quality:  Aching and pulsating Severity:  Severe Onset quality:  Gradual Duration:  2 days Timing:  Constant Progression:  Worsening Chronicity:  New Context comment:  Recently placed prosthesis after she had ruptured globe six months ago.  She also thinks she got some dirt in that eye a few days ago. Relieved by:  Nothing Ineffective treatments: ibuprofen. Associated symptoms: discharge (yellow), redness, swelling and tearing     Past Medical History  Diagnosis Date  . Trichimoniasis 2010   Past Surgical History  Procedure Laterality Date  . Cesarean section  1990  . Tubaligation  1994  . Ruptured globe exploration and repair Left 09/16/2014    Procedure:  Ruptured Globe Repair Left Eye;  Surgeon: Lamonte Sakai, MD;  Location: [facility];  Service: Ophthalmology;  Laterality: Left;   Family History  Problem Relation Age of Onset  . Hypertension Other   . Cancer Other   . CAD Other    History  Substance Use Topics  . Smoking status: Current Every Day Smoker -- 0.50 packs/day  . Smokeless tobacco: Not on file  . Alcohol Use: Yes     Comment: occas   OB History    No data available     Review of Systems  Eyes: Positive for discharge (yellow) and redness.  All other systems reviewed and are negative.     Allergies  Review of patient's allergies indicates no known allergies.  Home Medications   Prior to Admission medications   Medication Sig Start Date End Date Taking? Authorizing Provider  ibuprofen (ADVIL,MOTRIN) 200 MG tablet Take 200 mg by mouth every 6 (six) hours as needed for moderate pain.   Yes  Historical Provider, MD  neomycin-polymyxin b-dexamethasone (MAXITROL) 3.5-10000-0.1 OINT Place 1 application into the left eye 4 (four) times daily as needed (eye irritation). 09/18/14  Yes Jones Bales, MD  HYDROcodone-acetaminophen (NORCO/VICODIN) 5-325 MG per tablet Take 1-2 tablets by mouth every 4 (four) hours as needed. Patient not taking: Reported on 04/02/2015 03/08/15   Charlann Lange, PA-C  ibuprofen (ADVIL,MOTRIN) 800 MG tablet Take 1 tablet (800 mg total) by mouth 3 (three) times daily. Patient not taking: Reported on 04/02/2015 03/08/15   Charlann Lange, PA-C  ofloxacin (OCUFLOX) 0.3 % ophthalmic solution Place 1 drop into the left eye 4 (four) times daily. Patient not taking: Reported on 04/02/2015 09/18/14   Jones Bales, MD  oxyCODONE-acetaminophen (PERCOCET/ROXICET) 5-325 MG per tablet Take 1 tablet by mouth every 6 (six) hours as needed for moderate pain. Patient not taking: Reported on 04/02/2015 09/18/14   Jones Bales, MD  prednisoLONE acetate (PRED FORTE) 1 % ophthalmic suspension Place 1 drop into the left eye 4 (four) times daily. Patient not taking: Reported on 04/02/2015 09/18/14   Jones Bales, MD   BP 141/96 mmHg  Pulse 95  Temp(Src) 98 F (36.7 C) (Oral)  Resp 16  Ht 5\' 4"  (1.626 m)  Wt 180 lb (81.647 kg)  BMI 30.88 kg/m2  SpO2 97%  LMP 03/15/2011 Physical Exam  Constitutional: She is oriented to person, place, and time. She appears well-developed and well-nourished. No distress.  HENT:  Head: Normocephalic and atraumatic.  Eyes: No scleral icterus.  Left Periorbital swelling and tenderness.  Mild yellow discharge from eye socket.  Right eye normal.   Neck: Neck supple.  Cardiovascular: Normal rate and intact distal pulses.   Pulmonary/Chest: Effort normal. No stridor. No respiratory distress.  Abdominal: Normal appearance. She exhibits no distension.  Neurological: She is alert and oriented to person, place, and time.  Skin: Skin is warm and dry. No  rash noted.  Psychiatric: She has a normal mood and affect. Her behavior is normal.  Nursing note and vitals reviewed.   ED Course  Procedures (including critical care time) Labs Review Labs Reviewed  CBC WITH DIFFERENTIAL/PLATELET - Abnormal; Notable for the following:    MCHC 36.2 (*)    Platelets 132 (*)    All other components within normal limits  BASIC METABOLIC PANEL    Imaging Review Ct Orbits W/cm  04/03/2015   CLINICAL DATA:  Initial valuation for acute 8 chain, swelling, pain about a left eye prosthesis.  EXAM: CT ORBITS WITH CONTRAST  TECHNIQUE: Multidetector CT imaging of the orbits was performed following the bolus administration of intravenous contrast.  CONTRAST:  116mL OMNIPAQUE IOHEXOL 300 MG/ML  SOLN  COMPARISON:  Prior CT from 09/16/2014  FINDINGS: A left eye prosthesis is in place. The prosthesis itself appears mildly sunken within the bony left orbit. There is overlying mile preseptal edema and swelling, particularly about the medial canthus. Superimposed rim enhancing collection within the preseptal soft tissues at the medial aspect of the prosthesis measures 8 x 6 x 9 mm. There is associated rim enhancement. Finding suspicious for possible small abscess. No inflammatory changes seen within the postseptal soft tissues. Intraconal extraconal fat are preserved. Extra-ocular muscles within normal limits.  Remote posttraumatic deformity noted at the left lamina papyracea.  No acute abnormality about the right orbit.  Remainder of the partially visualized face demonstrates no acute abnormality. Visualized portions of the brain are within normal limits.  IMPRESSION: Left eye prosthesis in place. There is mild left-sided preseptal soft tissue swelling and edema, most prominent at the level of the medial canthus, with superimposed 8 x 6 x 9 mm rim enhancing collection within the preseptal soft tissues at the anteromedial aspect of the prosthesis. Finding concerning for possible small  abscess. No postseptal extension of inflammatory changes identified.   Electronically Signed   By: Jeannine Boga M.D.   On: 04/03/2015 01:53     EKG Interpretation None      MDM   Final diagnoses:  Preseptal cellulitis of left eye    54 yo female with left eye prosthesis with associated pain and swelling of the eye/periorbital region.  CT shows no signs of post septal cellulitis, but does show preseptal cellulitis and possibly a small developing abscess.  Plan to place on Augmentin and have her see her ophthalmologist today.      Serita Grit, MD 04/03/15 [phone]

## 2015-04-03 ENCOUNTER — Emergency Department (HOSPITAL_COMMUNITY): Payer: Self-pay

## 2015-04-03 LAB — BASIC METABOLIC PANEL
ANION GAP: 6 (ref 5–15)
BUN: 7 mg/dL (ref 6–20)
CHLORIDE: 108 mmol/L (ref 101–111)
CO2: 24 mmol/L (ref 22–32)
CREATININE: 0.71 mg/dL (ref 0.44–1.00)
Calcium: 9.2 mg/dL (ref 8.9–10.3)
GFR calc Af Amer: >60 mL/min (ref >60)
GFR calc non Af Amer: >60 mL/min (ref >60)
GLUCOSE: 89 mg/dL (ref 65–99)
Potassium: 3.6 mmol/L (ref 3.5–5.1)
Sodium: 138 mmol/L (ref 135–145)

## 2015-04-03 MED ORDER — OXYCODONE-ACETAMINOPHEN 5-325 MG PO TABS
1.0000 | ORAL_TABLET | Freq: Once | ORAL | Status: AC
  Administered 2015-04-03: 1 via ORAL
  Filled 2015-04-03: qty 1

## 2015-04-03 MED ORDER — AMOXICILLIN-POT CLAVULANATE 875-125 MG PO TABS
1.0000 | ORAL_TABLET | Freq: Once | ORAL | Status: AC
  Administered 2015-04-03: 1 via ORAL
  Filled 2015-04-03: qty 1

## 2015-04-03 MED ORDER — HYDROCODONE-ACETAMINOPHEN 5-325 MG PO TABS: 1.0000 | ORAL_TABLET | Freq: Four times a day (QID) | ORAL | Status: DC | PRN

## 2015-04-03 MED ORDER — IOHEXOL 300 MG/ML  SOLN
100.0000 mL | Freq: Once | INTRAMUSCULAR | Status: AC | PRN
Start: 2015-04-03 — End: 2015-04-03
  Administered 2015-04-03: 100 mL via INTRAVENOUS

## 2015-04-03 MED ORDER — AMOXICILLIN-POT CLAVULANATE 875-125 MG PO TABS: 1.0000 | ORAL_TABLET | Freq: Two times a day (BID) | ORAL | Status: DC

## 2015-04-03 NOTE — Discharge Instructions (Signed)
Periorbital Cellulitis °Periorbital cellulitis is a common infection that can affect the eyelid and the soft tissues that surround the eyeball. The infection may also affect the structures that produce and drain tears. It does not affect the eyeball itself. Natural tissue barriers usually prevent the spread of this infection to the eyeball and other deeper areas of the eye socket.      °CAUSES °· Bacterial infection. °· Long-term (chronic) sinus infections. °· An object (foreign body) stuck behind the eye. °· An injury that goes through the eyelid tissues. °· An injury that causes an infection, such as an insect sting. °· Fracture of the bone around the eye. °· Infections which have spread from the eyelid or other structures around the eye. °· Bite wounds. °· Inflammation or infection of the lining membranes of the brain (meningitis). °· An infection in the blood (septicemia). °· Dental infection (abscess). °· Viral infection (this is rare). °SYMPTOMS °Symptoms usually come on suddenly. °· Pain in the eye. °· Red, hot, and swollen eyelids and possibly cheeks. The swelling is sometimes bad enough that the eyelids cannot open. Some infections make the eyelids look purple. °· Fever and feeling generally ill. °· Pain when touching the area around the eye. °DIAGNOSIS  °Periorbital cellulitis can be diagnosed from an eye exam. In severe cases, your caregiver might suggest: °· Blood tests. °· Imaging tests (such as a CT scan) to examine the sinuses and the area around and behind the eyeball. °TREATMENT °If your caregiver feels that you do not have any signs of serious infection, treatment may include: °· Antibiotics. °· Nasal decongestants to reduce swelling. °· Referral to a dentist if it is suspected that the infection was caused by a prior tooth infection. °· Examination every day to make sure the problem is improving. °HOME CARE INSTRUCTIONS °· Take your antibiotics as directed. Finish them even if you start to feel  better. °· Some pain is normal with this condition. Take pain medicine as directed by your caregiver. Only take pain medicines approved by your caregiver. °· It is important to drink fluids. Drink enough water and fluids to keep your urine clear or pale yellow. °· Do not smoke. °· Rest and get plenty of sleep. °· Mild or moderate fevers generally have no long-term effects and often do not require treatment. °· If your caregiver has given you a follow-up appointment, it is very important to keep that appointment. Your caregiver will need to make sure that the infection is getting better. It is important to check that a more serious infection is not developing. °SEEK IMMEDIATE MEDICAL CARE IF: °· Your eyelids become more painful, red, warm, or swollen. °· You develop double vision or your vision becomes blurred or worsens in any way. °· You have trouble moving your eyes. °· The eye looks like it is popping out (proptosis). °· You develop a severe headache, severe neck pain, or neck stiffness. °· You develop repeated vomiting. °· You have a fever or persistent symptoms for more than 72 hours. °· You have a fever and your symptoms suddenly get worse. °MAKE SURE YOU: °· Understand these instructions. °· Will watch your condition. °· Will get help right away if you are not doing well or get worse. °Document Released: 11/02/2010 Document Revised: 12/23/2011 Document Reviewed: 11/02/2010 °[employer]® Patient Information ©2015 [employer]. This information is not intended to replace advice given to you by your health care provider. Make sure you discuss any questions you have with your health care provider. ° °

## 2015-04-03 NOTE — ED Notes (Signed)
Patient transported to CT 

## 2015-05-07 ENCOUNTER — Observation Stay (HOSPITAL_COMMUNITY)
Admission: EM | Admit: 2015-05-07 | Discharge: 2015-05-09 | Disposition: A | Discharge status: Home / Self Care | Payer: Self-pay | Attending: Internal Medicine | Admitting: Internal Medicine

## 2015-05-07 ENCOUNTER — Emergency Department (HOSPITAL_COMMUNITY): Payer: Self-pay

## 2015-05-07 ENCOUNTER — Encounter (HOSPITAL_COMMUNITY): Payer: Self-pay | Admitting: Emergency Medicine

## 2015-05-07 DIAGNOSIS — R519 Headache, unspecified: Secondary | ICD-10-CM | POA: Diagnosis present

## 2015-05-07 DIAGNOSIS — F1290 Cannabis use, unspecified, uncomplicated: Secondary | ICD-10-CM | POA: Insufficient documentation

## 2015-05-07 DIAGNOSIS — F17210 Nicotine dependence, cigarettes, uncomplicated: Secondary | ICD-10-CM | POA: Insufficient documentation

## 2015-05-07 DIAGNOSIS — E872 Acidosis: Secondary | ICD-10-CM | POA: Insufficient documentation

## 2015-05-07 DIAGNOSIS — F1410 Cocaine abuse, uncomplicated: Secondary | ICD-10-CM | POA: Insufficient documentation

## 2015-05-07 DIAGNOSIS — R51 Headache: Secondary | ICD-10-CM | POA: Insufficient documentation

## 2015-05-07 DIAGNOSIS — Z8249 Family history of ischemic heart disease and other diseases of the circulatory system: Secondary | ICD-10-CM | POA: Insufficient documentation

## 2015-05-07 DIAGNOSIS — F1010 Alcohol abuse, uncomplicated: Secondary | ICD-10-CM | POA: Insufficient documentation

## 2015-05-07 DIAGNOSIS — R9401 Abnormal electroencephalogram [EEG]: Secondary | ICD-10-CM | POA: Insufficient documentation

## 2015-05-07 DIAGNOSIS — R4781 Slurred speech: Secondary | ICD-10-CM | POA: Insufficient documentation

## 2015-05-07 DIAGNOSIS — G459 Transient cerebral ischemic attack, unspecified: Secondary | ICD-10-CM | POA: Diagnosis present

## 2015-05-07 DIAGNOSIS — E785 Hyperlipidemia, unspecified: Secondary | ICD-10-CM | POA: Insufficient documentation

## 2015-05-07 DIAGNOSIS — R531 Weakness: Principal | ICD-10-CM | POA: Insufficient documentation

## 2015-05-07 DIAGNOSIS — F1910 Other psychoactive substance abuse, uncomplicated: Secondary | ICD-10-CM | POA: Diagnosis present

## 2015-05-07 DIAGNOSIS — E876 Hypokalemia: Secondary | ICD-10-CM | POA: Insufficient documentation

## 2015-05-07 DIAGNOSIS — R55 Syncope and collapse: Secondary | ICD-10-CM | POA: Insufficient documentation

## 2015-05-07 LAB — CBC
HCT: 37.3 % (ref 36.0–46.0)
Hemoglobin: 13.7 g/dL (ref 12.0–15.0)
MCH: 32.9 pg (ref 26.0–34.0)
MCHC: 36.7 g/dL — AB (ref 30.0–36.0)
MCV: 89.7 fL (ref 78.0–100.0)
Platelets: 150 10*3/uL (ref 150–400)
RBC: 4.16 MIL/uL (ref 3.87–5.11)
RDW: 14.1 % (ref 11.5–15.5)
WBC: 6.7 10*3/uL (ref 4.0–10.5)

## 2015-05-07 LAB — DIFFERENTIAL
BASOS PCT: 1 % (ref 0–1)
Basophils Absolute: 0 10*3/uL (ref 0.0–0.1)
Eosinophils Absolute: 0.2 10*3/uL (ref 0.0–0.7)
Eosinophils Relative: 3 % (ref 0–5)
Lymphocytes Relative: 65 % — ABNORMAL HIGH (ref 12–46)
Lymphs Abs: 4.4 10*3/uL — ABNORMAL HIGH (ref 0.7–4.0)
MONO ABS: 0.4 10*3/uL (ref 0.1–1.0)
Monocytes Relative: 6 % (ref 3–12)
NEUTROS ABS: 1.7 10*3/uL (ref 1.7–7.7)
NEUTROS PCT: 25 % — AB (ref 43–77)

## 2015-05-07 LAB — I-STAT CHEM 8, ED
BUN: 7 mg/dL (ref 6–20)
CHLORIDE: 109 mmol/L (ref 101–111)
CREATININE: 1 mg/dL (ref 0.44–1.00)
Calcium, Ion: 1.19 mmol/L (ref 1.12–1.23)
Glucose, Bld: 125 mg/dL — ABNORMAL HIGH (ref 65–99)
HCT: 43 % (ref 36.0–46.0)
Hemoglobin: 14.6 g/dL (ref 12.0–15.0)
Potassium: 3.4 mmol/L — ABNORMAL LOW (ref 3.5–5.1)
Sodium: 144 mmol/L (ref 135–145)
TCO2: 19 mmol/L (ref 0–100)

## 2015-05-07 LAB — COMPREHENSIVE METABOLIC PANEL
ALK PHOS: 58 U/L (ref 38–126)
ALT: 31 U/L (ref 14–54)
AST: 49 U/L — ABNORMAL HIGH (ref 15–41)
Albumin: 3.8 g/dL (ref 3.5–5.0)
Anion gap: 13 (ref 5–15)
BILIRUBIN TOTAL: 0.6 mg/dL (ref 0.3–1.2)
BUN: 7 mg/dL (ref 6–20)
CALCIUM: 8.7 mg/dL — AB (ref 8.9–10.3)
CO2: 18 mmol/L — ABNORMAL LOW (ref 22–32)
Chloride: 103 mmol/L (ref 101–111)
Creatinine, Ser: 0.76 mg/dL (ref 0.44–1.00)
GFR calc non Af Amer: >60 mL/min (ref >60)
GLUCOSE: 129 mg/dL — AB (ref 65–99)
POTASSIUM: 3.2 mmol/L — AB (ref 3.5–5.1)
Sodium: 134 mmol/L — ABNORMAL LOW (ref 135–145)
Total Protein: 7.1 g/dL (ref 6.5–8.1)

## 2015-05-07 LAB — ETHANOL: Alcohol, Ethyl (B): 188 mg/dL — ABNORMAL HIGH (ref <5)

## 2015-05-07 LAB — APTT: aPTT: 26 seconds (ref 24–37)

## 2015-05-07 LAB — PROTIME-INR
INR: 1.14 (ref 0.00–1.49)
Prothrombin Time: 14.8 seconds (ref 11.6–15.2)

## 2015-05-07 LAB — I-STAT TROPONIN, ED: Troponin i, poc: 0 ng/mL (ref 0.00–0.08)

## 2015-05-07 MED ORDER — LORAZEPAM 1 MG PO TABS: 1.0000 mg | ORAL_TABLET | Freq: Four times a day (QID) | ORAL | Status: DC | PRN

## 2015-05-07 MED ORDER — FOLIC ACID 1 MG PO TABS
1.0000 mg | ORAL_TABLET | Freq: Every day | ORAL | Status: DC
Start: 2015-05-08 — End: 2015-05-09
  Administered 2015-05-08 – 2015-05-09 (×2): 1 mg via ORAL
  Filled 2015-05-07 (×2): qty 1

## 2015-05-07 MED ORDER — ASPIRIN 325 MG PO TABS: 325.0000 mg | ORAL_TABLET | Freq: Every day | ORAL | Status: DC

## 2015-05-07 MED ORDER — DIPHENHYDRAMINE HCL 50 MG/ML IJ SOLN
25.0000 mg | Freq: Once | INTRAMUSCULAR | Status: AC
  Administered 2015-05-07: 25 mg via INTRAVENOUS
  Filled 2015-05-07: qty 1

## 2015-05-07 MED ORDER — KETOROLAC TROMETHAMINE 30 MG/ML IJ SOLN
30.0000 mg | Freq: Once | INTRAMUSCULAR | Status: AC
  Administered 2015-05-07: 30 mg via INTRAVENOUS
  Filled 2015-05-07: qty 1

## 2015-05-07 MED ORDER — VITAMIN B-1 100 MG PO TABS
100.0000 mg | ORAL_TABLET | Freq: Every day | ORAL | Status: DC
  Administered 2015-05-08 – 2015-05-09 (×2): 100 mg via ORAL
  Filled 2015-05-07 (×2): qty 1

## 2015-05-07 MED ORDER — THIAMINE HCL 100 MG/ML IJ SOLN
100.0000 mg | Freq: Every day | INTRAMUSCULAR | Status: DC
  Filled 2015-05-07: qty 2

## 2015-05-07 MED ORDER — ENOXAPARIN SODIUM 40 MG/0.4ML ~~LOC~~ SOLN
40.0000 mg | Freq: Every day | SUBCUTANEOUS | Status: DC
  Administered 2015-05-08 – 2015-05-09 (×2): 40 mg via SUBCUTANEOUS
  Filled 2015-05-07 (×2): qty 0.4

## 2015-05-07 MED ORDER — METOCLOPRAMIDE HCL 5 MG/ML IJ SOLN
10.0000 mg | Freq: Once | INTRAMUSCULAR | Status: AC
  Administered 2015-05-07: 10 mg via INTRAVENOUS
  Filled 2015-05-07: qty 2

## 2015-05-07 MED ORDER — LORAZEPAM 2 MG/ML IJ SOLN: 1.0000 mg | Freq: Four times a day (QID) | INTRAMUSCULAR | Status: DC | PRN

## 2015-05-07 MED ORDER — ADULT MULTIVITAMIN W/MINERALS CH
1.0000 | ORAL_TABLET | Freq: Every day | ORAL | Status: DC
  Administered 2015-05-08 – 2015-05-09 (×2): 1 via ORAL
  Filled 2015-05-07 (×2): qty 1

## 2015-05-07 MED ORDER — STROKE: EARLY STAGES OF RECOVERY BOOK
Freq: Once | Status: AC
  Administered 2015-05-07: 1
  Filled 2015-05-07: qty 1

## 2015-05-07 NOTE — Consult Note (Addendum)
Consult Reason for Consult: slurred speech and right sided weakness Referring Physician: Dr Regenia Skeeter [facility] ED  CC: slurred speech and right sided weakness  HPI: Janet Ramos is an 54 y.o. female hx of substance abuse presenting with acute onset of slurred speech and right sided weakness. LSW 2040 when husband notes she fell to the ground, he denies any LOC or seizure activity. She was able to stand on her own but EMS reports she appeared unsteady, trouble using her right side and was slurring her words. She reports continued weakness and a cold sensation in her RUE. EtOH level of 188.   Normally drinks 2 40 oz of beer a day, today reports only drinking one. CT head imaging was reviewed and overall unremarkable. No prior TIA or CVA history.Husband reports one seizure in the past.   Last seen well: 7/24 at 2040. tPA not given due to MRI negative for stroke.     Past Medical History  Diagnosis Date  . Trichimoniasis 2010    Past Surgical History  Procedure Laterality Date  . Cesarean section  1990  . Tubaligation  1994  . Ruptured globe exploration and repair Left 09/16/2014    Procedure:  Ruptured Globe Repair Left Eye;  Surgeon: Lamonte Sakai, MD;  Location: [place];  Service: Ophthalmology;  Laterality: Left;    Family History  Problem Relation Age of Onset  . Hypertension Other   . Cancer Other   . CAD Other     Social History:  reports that she has been smoking.  She does not have any smokeless tobacco history on file. She reports that she drinks alcohol. She reports that she does not use illicit drugs.  No Known Allergies  Medications: I have reviewed the patient's current medications.  ROS: Out of a complete 14 system review, the patient complains of only the following symptoms, and all other reviewed systems are negative.  Physical Examination: Filed Vitals:   05/07/15 2141  BP: 111/68  Pulse: 84  Temp: 98.1 F (36.7 C)  Resp: 22   Physical Exam  Constitutional: He  appears well-developed and well-nourished.  Psych: Affect appropriate to situation Eyes: No scleral injection HENT: No OP obstrucion Head: Normocephalic.  Cardiovascular: Normal rate and regular rhythm.  Respiratory: Effort normal and breath sounds normal.  GI: Soft. Bowel sounds are normal. No distension. There is no tenderness.  Skin: WDI  Neurologic Examination Mental Status: Alert, oriented, thought content appropriate.  Speech fluent without evidence of aphasia. Mild dysarthria. Difficulty following multi-step commands. Cranial Nerves: II: funduscopic exam wnl bilaterally, visual fields grossly normal, left prosthetic pupil III,IV, VI: ptosis not present, extra-ocular motions intact bilaterally V,VII: smile symmetric, facial light touch sensation normal bilaterally VIII: hearing normal bilaterally IX,X: gag reflex present XI: trapezius strength/neck flexion strength normal bilaterally XII: tongue strength normal  Motor: Right : Upper extremity    Left:     Upper extremity 4-/5 deltoid       5/5 deltoid 4/5 biceps      5/5 biceps  4+/5 triceps      5/5 triceps 4+/5 hand grip      5/5 hand grip  Lower extremity     Lower extremity 5-/5 hip flexor      5/5 hip flexor 5-/5 quadricep      5/5 quadriceps  5-/5 hamstrings     5/5 hamstrings 5/5 plantar flexion       5/5 plantar flexion 5/5 plantar extension     5/5 plantar  extension Tone and bulk:normal tone throughout; no atrophy noted Sensory: decreased LT and PP RUE Deep Tendon Reflexes: 2+ and symmetric throughout Plantars: Right: downgoing   Left: downgoing Cerebellar: Normal FTN on left, unable to test on right side Gait: deferred  Laboratory Studies:   Basic Metabolic Panel: No results for input(s): NA, K, CL, CO2, GLUCOSE, BUN, CREATININE, CALCIUM, MG, PHOS in the last 168 hours.  Liver Function Tests: No results for input(s): AST, ALT, ALKPHOS, BILITOT, PROT, ALBUMIN in the last 168 hours. No results for  input(s): LIPASE, AMYLASE in the last 168 hours. No results for input(s): AMMONIA in the last 168 hours.  CBC:  Recent Labs Lab 05/07/15 2126  WBC 6.7  NEUTROABS 1.7  HGB 13.7  HCT 37.3  MCV 89.7  PLT 150    Cardiac Enzymes: No results for input(s): CKTOTAL, CKMB, CKMBINDEX, TROPONINI in the last 168 hours.  BNP: Invalid input(s): POCBNP  CBG: No results for input(s): GLUCAP in the last 168 hours.  Microbiology: Results for orders placed or performed during the hospital encounter of 09/16/14  Surgical pcr screen     Status: Abnormal   Collection Time: 09/16/14  5:52 PM  Result Value Ref Range Status   MRSA, PCR NEGATIVE NEGATIVE Final   Staphylococcus aureus POSITIVE (A) NEGATIVE Final    Comment:        The Xpert SA Assay (FDA approved for NASAL specimens in patients over 23 years of age), is one component of a comprehensive surveillance program.  Test performance has been validated by [employer] for patients greater than or equal to 76 year old. It is not intended to diagnose infection nor to guide or monitor treatment.     Coagulation Studies: No results for input(s): LABPROT, INR in the last 72 hours.  Urinalysis: No results for input(s): COLORURINE, LABSPEC, PHURINE, GLUCOSEU, HGBUR, BILIRUBINUR, KETONESUR, PROTEINUR, UROBILINOGEN, NITRITE, LEUKOCYTESUR in the last 168 hours.  Invalid input(s): APPERANCEUR  Lipid Panel:  No results found for: CHOL, TRIG, HDL, CHOLHDL, VLDL, LDLCALC  HgbA1C: No results found for: HGBA1C  Urine Drug Screen:     Component Value Date/Time   LABOPIA NONE DETECTED 09/16/2014 1016   COCAINSCRNUR POSITIVE* 09/16/2014 1016   LABBENZ NONE DETECTED 09/16/2014 1016   AMPHETMU NONE DETECTED 09/16/2014 1016   THCU NONE DETECTED 09/16/2014 1016   LABBARB NONE DETECTED 09/16/2014 1016    Alcohol Level: No results for input(s): ETH in the last 168 hours.  Other results:  Imaging: No results  found.   Assessment/Plan:  54y/o hx of substance abuse presenting with acute onset of slurred speech and right sided weakness after a fall today. LSW 2040. Code stroke activated. Exam showed some inconsistencies so decision made to proceed with stat MRI DWI imaging. DWI was negative for acute infarct. Unclear etiology of symptoms. Cannot rule seizure with postictal state and Todd's paralysis. Loss of consciousness may represent syncopal episode though this would not explain persistent RUE weakness. Question if some functional component to her weakness.   -check EEG -check 2D echo, carotid doppler -can try migraine cocktail for symptomatic headache relief -if no improvement in RUE weakness may consider MRI C spine -rehab evaluation -check UDS -will continue to follow  Jim Like, DO [facility] [phone]  If 7pm- 7am, please page neurology on call as listed in [facility]. 05/07/2015, 9:48 PM

## 2015-05-07 NOTE — ED Notes (Signed)
LKW 2040. Pt fell at that time, found down by husband with slurred speech and unsteady on her feet. R sided weakness

## 2015-05-07 NOTE — ED Notes (Signed)
Transported pt to MRI. This RN with pt.

## 2015-05-07 NOTE — ED Provider Notes (Signed)
CSN: [id number]     Arrival date & time 05/07/15  2124 History   First MD Initiated Contact with Patient 05/07/15 2126     Chief Complaint  Patient presents with  . Code Stroke    An emergency department physician performed an initial assessment on this suspected stroke patient at 2125. (Consider location/radiation/quality/duration/timing/severity/associated sxs/prior Treatment) HPI  54 year old female brought in by EMS with a possible stroke. Patient was last seen normal at 840 this evening. She was in her house and she fell down and after that has been having slurred speech and right-sided weakness. Patient was placed in a c-collar by EMS.  Patient reports that she had a half a beer and was reading outside and then when she went back inside she felt lightheaded and dizzy and the next thing she knows her husband was picking her up off the ground. She's complaining of a left occipital headache since the fall. She did not have any weakness or slurred speech before the fall. Patient's husband states he heard her fall and another room when she did not respond to his questions he went to go check on her. She seemed to be coming around it was a little disoriented. He never saw seizure-like activity. Patient does endorse having 1 seizure about one year ago, does not know why he is not on antiseizure medicine. Patient denies any neck pain or back pain at this time.  Past Medical History  Diagnosis Date  . Trichimoniasis 2010   Past Surgical History  Procedure Laterality Date  . Cesarean section  1990  . Tubaligation  1994  . Ruptured globe exploration and repair Left 09/16/2014    Procedure:  Ruptured Globe Repair Left Eye;  Surgeon: Lamonte Sakai, MD;  Location: [facility];  Service: Ophthalmology;  Laterality: Left;   Family History  Problem Relation Age of Onset  . Hypertension Other   . Cancer Other   . CAD Other    History  Substance Use Topics  . Smoking status: Current Every Day Smoker --  0.50 packs/day  . Smokeless tobacco: Not on file  . Alcohol Use: Yes     Comment: occas   OB History    No data available     Review of Systems  Constitutional: Negative for fever.  Respiratory: Negative for shortness of breath.   Cardiovascular: Negative for chest pain.  Gastrointestinal: Negative for vomiting.  Musculoskeletal: Negative for neck pain.  Neurological: Positive for dizziness, syncope, weakness, numbness and headaches.  All other systems reviewed and are negative.     Allergies  Review of patient's allergies indicates no known allergies.  Home Medications   Prior to Admission medications   Medication Sig Start Date End Date Taking? Authorizing Provider  amoxicillin-clavulanate (AUGMENTIN) 875-125 MG per tablet Take 1 tablet by mouth 2 (two) times daily. 04/03/15   Serita Grit, MD  HYDROcodone-acetaminophen (NORCO/VICODIN) 5-325 MG per tablet Take 1-2 tablets by mouth every 6 (six) hours as needed. 04/03/15   Serita Grit, MD  ibuprofen (ADVIL,MOTRIN) 200 MG tablet Take 200 mg by mouth every 6 (six) hours as needed for moderate pain.    Historical Provider, MD  ibuprofen (ADVIL,MOTRIN) 800 MG tablet Take 1 tablet (800 mg total) by mouth 3 (three) times daily. Patient not taking: Reported on 04/02/2015 03/08/15   Charlann Lange, PA-C  neomycin-polymyxin b-dexamethasone (MAXITROL) 3.5-10000-0.1 OINT Place 1 application into the left eye 4 (four) times daily as needed (eye irritation). 09/18/14   Jones Bales, MD  ofloxacin (OCUFLOX) 0.3 % ophthalmic solution Place 1 drop into the left eye 4 (four) times daily. Patient not taking: Reported on 04/02/2015 09/18/14   Jones Bales, MD  oxyCODONE-acetaminophen (PERCOCET/ROXICET) 5-325 MG per tablet Take 1 tablet by mouth every 6 (six) hours as needed for moderate pain. Patient not taking: Reported on 04/02/2015 09/18/14   Jones Bales, MD  prednisoLONE acetate (PRED FORTE) 1 % ophthalmic suspension Place 1 drop into the  left eye 4 (four) times daily. Patient not taking: Reported on 04/02/2015 09/18/14   Jones Bales, MD   BP 111/68 mmHg  Pulse 84  Temp(Src) 98.1 F (36.7 C) (Oral)  Resp 22  Wt 180 lb (81.647 kg)  SpO2 94%  LMP 03/15/2011 Physical Exam  Constitutional: She is oriented to person, place, and time. She appears well-developed and well-nourished.  HENT:  Head: Normocephalic and atraumatic.  Right Ear: External ear normal.  Left Ear: External ear normal.  Nose: Nose normal.  Eyes: EOM are normal. Pupils are equal, round, and reactive to light. Right eye exhibits no discharge. Left eye exhibits no discharge. Right conjunctiva is injected.  Right conjunctiva inflamed  Neck: Neck supple. No muscular tenderness present.  Cardiovascular: Normal rate, regular rhythm and normal heart sounds.   Pulmonary/Chest: Effort normal and breath sounds normal.  Abdominal: Soft. She exhibits no distension. There is no tenderness.  Musculoskeletal:  Patient reports no sensation to her right upper and lower extremity. Patient is barely able to lift her right arm off the bed. When her arm is held over her head she avoids dropping it over her face. Is able to lift her right leg off the bed but is weaker than the left.  Neurological: She is alert and oriented to person, place, and time. She has normal reflexes.  Skin: Skin is warm and dry.  Nursing note and vitals reviewed.   ED Course  Procedures (including critical care time) Labs Review Labs Reviewed  CBC - Abnormal; Notable for the following:    MCHC 36.7 (*)    All other components within normal limits  DIFFERENTIAL - Abnormal; Notable for the following:    Neutrophils Relative % 25 (*)    Lymphocytes Relative 65 (*)    Lymphs Abs 4.4 (*)    All other components within normal limits  I-STAT CHEM 8, ED - Abnormal; Notable for the following:    Potassium 3.4 (*)    Glucose, Bld 125 (*)    All other components within normal limits  PROTIME-INR   APTT  COMPREHENSIVE METABOLIC PANEL  ETHANOL  URINE RAPID DRUG SCREEN, HOSP PERFORMED  I-STAT TROPOININ, ED  CBG MONITORING, ED    Imaging Review Ct Head Wo Contrast  05/07/2015   CLINICAL DATA:  Code stroke. Right-sided weakness, slurred speech. Headache. Fall.  EXAM: CT HEAD WITHOUT CONTRAST  CT CERVICAL SPINE WITHOUT CONTRAST  TECHNIQUE: Multidetector CT imaging of the head and cervical spine was performed following the standard protocol without intravenous contrast. Multiplanar CT image reconstructions of the cervical spine were also generated.  COMPARISON:  Head and cervical spine CT 10/06/2014  FINDINGS: CT HEAD FINDINGS  No intracranial hemorrhage, mass effect, or midline shift. No hydrocephalus. The basilar cisterns are patent. No evidence of territorial infarct. No intracranial fluid collection. Calvarium is intact. 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 Mastoid air cells are well aerated.  CT CERVICAL SPINE FINDINGS  No acute fracture or subluxation. Vertebral body heights are  preserved. Mild endplate spurring throughout the cervical spine with minimal peripheral disc calcification at multiple levels. The dens is intact. There are no jumped or perched facets. No prevertebral soft tissue edema.  IMPRESSION: 1.  No acute intracranial abnormality. 2. No fracture or subluxation of the cervical spine. These results were called by telephone at the time of interpretation on 05/07/2015 at 9:52 pm to Dr. Janann Colonel , who verbally acknowledged these results.   Electronically Signed   By: Jeb Levering M.D.   On: 05/07/2015 21:53   Ct Cervical Spine Wo Contrast  05/07/2015   CLINICAL DATA:  Code stroke. Right-sided weakness, slurred speech. Headache. Fall.  EXAM: CT HEAD WITHOUT CONTRAST  CT CERVICAL SPINE WITHOUT CONTRAST  TECHNIQUE: Multidetector CT imaging of the head and cervical spine was performed following the standard protocol without intravenous  contrast. Multiplanar CT image reconstructions of the cervical spine were also generated.  COMPARISON:  Head and cervical spine CT 10/06/2014  FINDINGS: CT HEAD FINDINGS  No intracranial hemorrhage, mass effect, or midline shift. No hydrocephalus. The basilar cisterns are patent. No evidence of territorial infarct. No intracranial fluid collection. Calvarium is intact. 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 Mastoid air cells are well aerated.  CT CERVICAL SPINE FINDINGS  No acute fracture or subluxation. Vertebral body heights are preserved. Mild endplate spurring throughout the cervical spine with minimal peripheral disc calcification at multiple levels. The dens is intact. There are no jumped or perched facets. No prevertebral soft tissue edema.  IMPRESSION: 1.  No acute intracranial abnormality. 2. No fracture or subluxation of the cervical spine. These results were called by telephone at the time of interpretation on 05/07/2015 at 9:52 pm to Dr. Janann Colonel , who verbally acknowledged these results.   Electronically Signed   By: Jeb Levering M.D.   On: 05/07/2015 21:53     EKG Interpretation   Date/Time:  Sunday May 07 2015 21:41:50 EDT Ventricular Rate:  90 PR Interval:  158 QRS Duration: 82 QT Interval:  367 QTC Calculation: 449 R Axis:   24 Text Interpretation:  Normal sinus rhythm no acute ST/T changes Confirmed  by Dare Spillman  MD, Yaretzy Olazabal ([id number]) on 05/07/2015 10:17:23 PM      MDM   Final diagnoses:  Right sided weakness    No signs of head trauma that would cause acute weakness such as a bleed. No skull fracture. Dr. Janann Colonel took the patient to MRI immediately and per his report this is unremarkable. Patient is slowly regaining some function. Her weakness is most likely functional/psychogenic versus a Todd's paralysis from a possible seizure. She does have a mild acidosis with a bicarbonate of 18 on her metabolic panel. Will treat her headache,  and after discussion with neurology will admit for overnight observation on telemetry for syncope versus seizure workup.    Sherwood Gambler, MD 05/07/15 [phone]

## 2015-05-07 NOTE — ED Notes (Signed)
Escorted to MRI by Lucina Mellow

## 2015-05-07 NOTE — H&P (Signed)
History and Physical    Janet Ramos:[id number] DOB: 1961-06-27 DOA: 05/07/2015  Referring physician: Dr. Regenia Skeeter PCP: No primary care provider on file.  Specialists: neurology   Chief Complaint: right sided weakness / syncope  HPI: Janet Ramos is a 54 y.o. female  without extensive significant medical history, alcohol abuse, presents to the emergency room with the chief complaint of a possible stroke. Per her husband, she was doing well early in the evening, and he heard her falling. Patient remembers being outside reading, and when she walked back inside she felt lightheaded and next thing that she knows was that people were around her. She endorses a history of "seizures" there are usually brought in by stress. Last time she had a seizure was about a year ago. She doesn't have a PCP or seeks care regular basis. She doesn't take any medications on a regular basis. She denies any fever or chills, she denies any chest pain or breathing difficulties. She denies abdominal pain, endorses mild nausea. She denies any diarrhea, denies any blood in her stool, denies any dysuria. In the emergency room, patient had right-sided weakness on presentation, she was admitted as a code stroke, neurology evaluated patient, she underwent an MRI with final read pending, and [facility] was asked for admission for further workup for syncope/CVA/seizure.  Review of Systems: As per history of present illness, otherwise 10 point review of systems negative  Past Medical History  Diagnosis Date  . Trichimoniasis 2010   Past Surgical History  Procedure Laterality Date  . Cesarean section  1990  . Tubaligation  1994  . Ruptured globe exploration and repair Left 09/16/2014    Procedure:  Ruptured Globe Repair Left Eye;  Surgeon: Lamonte Sakai, MD;  Location: [facility];  Service: Ophthalmology;  Laterality: Left;   Social History:  reports that she has been smoking.  She does not have any smokeless tobacco history on file.  She reports that she drinks alcohol. She reports that she does not use illicit drugs.  No Known Allergies  Family History  Problem Relation Age of Onset  . Hypertension Other   . Cancer Other   . CAD Other     Prior to Admission medications   Medication Sig Start Date End Date Taking? Authorizing Provider  amoxicillin-clavulanate (AUGMENTIN) 875-125 MG per tablet Take 1 tablet by mouth 2 (two) times daily. 04/03/15   Serita Grit, MD  HYDROcodone-acetaminophen (NORCO/VICODIN) 5-325 MG per tablet Take 1-2 tablets by mouth every 6 (six) hours as needed. 04/03/15   Serita Grit, MD  ibuprofen (ADVIL,MOTRIN) 200 MG tablet Take 200 mg by mouth every 6 (six) hours as needed for moderate pain.    Historical Provider, MD  ibuprofen (ADVIL,MOTRIN) 800 MG tablet Take 1 tablet (800 mg total) by mouth 3 (three) times daily. Patient not taking: Reported on 04/02/2015 03/08/15   Charlann Lange, PA-C  neomycin-polymyxin b-dexamethasone (MAXITROL) 3.5-10000-0.1 OINT Place 1 application into the left eye 4 (four) times daily as needed (eye irritation). 09/18/14   Jones Bales, MD  ofloxacin (OCUFLOX) 0.3 % ophthalmic solution Place 1 drop into the left eye 4 (four) times daily. Patient not taking: Reported on 04/02/2015 09/18/14   Jones Bales, MD  oxyCODONE-acetaminophen (PERCOCET/ROXICET) 5-325 MG per tablet Take 1 tablet by mouth every 6 (six) hours as needed for moderate pain. Patient not taking: Reported on 04/02/2015 09/18/14   Jones Bales, MD  prednisoLONE acetate (PRED FORTE) 1 % ophthalmic suspension Place 1  drop into the left eye 4 (four) times daily. Patient not taking: Reported on 04/02/2015 09/18/14   Jones Bales, MD   Physical Exam: Filed Vitals:   05/07/15 2235 05/07/15 2238 05/07/15 2245 05/07/15 2301  BP: 127/83 127/83 115/75 132/82  Pulse:  76 86 86  Temp:      TempSrc:      Resp:  18 19 17   Weight:      SpO2:  96% 97% 96%     General:  No apparent distress  Eyes:  PERRL, EOMI, no scleral icterus  ENT: moist oropharynx  Neck: supple, no lymphadenopathy  Cardiovascular: regular rate without MRG; 2+ peripheral pulses, no JVD, no peripheral edema  Respiratory: CTA biL, good air movement without wheezing, rhonchi or crackled  Abdomen: soft, non tender to palpation, positive bowel sounds, no guarding, no rebound  Skin: no rashes  Musculoskeletal: normal bulk and tone, no joint swelling  Psychiatric: normal mood and affect  Neurologic: right sided weakness 4/5, 5/5 on left, noticeable facial droop  Labs on Admission:  Basic Metabolic Panel:  Recent Labs Lab 05/07/15 2126 05/07/15 2154  NA 134* 144  K 3.2* 3.4*  CL 103 109  CO2 18*  --   GLUCOSE 129* 125*  BUN 7 7  CREATININE 0.76 1.00  CALCIUM 8.7*  --    Liver Function Tests:  Recent Labs Lab 05/07/15 2126  AST 49*  ALT 31  ALKPHOS 58  BILITOT 0.6  PROT 7.1  ALBUMIN 3.8   CBC:  Recent Labs Lab 05/07/15 2126 05/07/15 2154  WBC 6.7  --   NEUTROABS 1.7  --   HGB 13.7 14.6  HCT 37.3 43.0  MCV 89.7  --   PLT 150  --    Cardiac Enzymes: No results for input(s): CKTOTAL, CKMB, CKMBINDEX, TROPONINI in the last 168 hours.  BNP (last 3 results) No results for input(s): BNP in the last 8760 hours.  ProBNP (last 3 results) No results for input(s): PROBNP in the last 8760 hours.  CBG: No results for input(s): GLUCAP in the last 168 hours.  Radiological Exams on Admission: Ct Head Wo Contrast  05/07/2015   CLINICAL DATA:  Code stroke. Right-sided weakness, slurred speech. Headache. Fall.  EXAM: CT HEAD WITHOUT CONTRAST  CT CERVICAL SPINE WITHOUT CONTRAST  TECHNIQUE: Multidetector CT imaging of the head and cervical spine was performed following the standard protocol without intravenous contrast. Multiplanar CT image reconstructions of the cervical spine were also generated.  COMPARISON:  Head and cervical spine CT 10/06/2014  FINDINGS: CT HEAD FINDINGS  No intracranial  hemorrhage, mass effect, or midline shift. No hydrocephalus. The basilar cisterns are patent. No evidence of territorial infarct. No intracranial fluid collection. Calvarium is intact. 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 Mastoid air cells are well aerated.  CT CERVICAL SPINE FINDINGS  No acute fracture or subluxation. Vertebral body heights are preserved. Mild endplate spurring throughout the cervical spine with minimal peripheral disc calcification at multiple levels. The dens is intact. There are no jumped or perched facets. No prevertebral soft tissue edema.  IMPRESSION: 1.  No acute intracranial abnormality. 2. No fracture or subluxation of the cervical spine. These results were called by telephone at the time of interpretation on 05/07/2015 at 9:52 pm to Dr. Janann Colonel , who verbally acknowledged these results.   Electronically Signed   By: Jeb Levering M.D.   On: 05/07/2015 21:53   Ct Cervical Spine Wo Contrast  05/07/2015   CLINICAL DATA:  Code stroke. Right-sided weakness, slurred speech. Headache. Fall.  EXAM: CT HEAD WITHOUT CONTRAST  CT CERVICAL SPINE WITHOUT CONTRAST  TECHNIQUE: Multidetector CT imaging of the head and cervical spine was performed following the standard protocol without intravenous contrast. Multiplanar CT image reconstructions of the cervical spine were also generated.  COMPARISON:  Head and cervical spine CT 10/06/2014  FINDINGS: CT HEAD FINDINGS  No intracranial hemorrhage, mass effect, or midline shift. No hydrocephalus. The basilar cisterns are patent. No evidence of territorial infarct. No intracranial fluid collection. Calvarium is intact. 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 Mastoid air cells are well aerated.  CT CERVICAL SPINE FINDINGS  No acute fracture or subluxation. Vertebral body heights are preserved. Mild endplate spurring throughout the cervical spine with minimal peripheral  disc calcification at multiple levels. The dens is intact. There are no jumped or perched facets. No prevertebral soft tissue edema.  IMPRESSION: 1.  No acute intracranial abnormality. 2. No fracture or subluxation of the cervical spine. These results were called by telephone at the time of interpretation on 05/07/2015 at 9:52 pm to Dr. Janann Colonel , who verbally acknowledged these results.   Electronically Signed   By: Jeb Levering M.D.   On: 05/07/2015 21:53   Mr Brain Wo Contrast  05/07/2015   CLINICAL DATA:  Initial evaluation for acute right-sided weakness, found down, slurred speech.  EXAM: MRI HEAD WITHOUT CONTRAST  TECHNIQUE: Multiplanar, multiecho pulse sequences of the brain and surrounding structures were obtained without intravenous contrast.  COMPARISON:  Prior CT from earlier the same day.  FINDINGS: The CSF containing spaces are within normal limits for patient age. No focal parenchymal signal abnormality is identified. No mass lesion, midline shift, or extra-axial fluid collection. Ventricles are normal in size without evidence of hydrocephalus.  No diffusion-weighted signal abnormality is identified to suggest acute intracranial infarct. Gray-white matter differentiation is maintained. Normal flow voids are seen within the intracranial vasculature. No intracranial hemorrhage identified.  The craniocervical junction within normal limits. Mild degenerative changes present within the visualized upper cervical spine. Pituitary gland is within normal limits. Pituitary stalk is midline. Left ocular implant noted. Right globe within normal limits.  The bone marrow signal intensity is normal. Calvarium is intact. Visualized upper cervical spine is within normal limits.  Scalp soft tissues are unremarkable.  Mild mucosal thickening within the ethmoidal air cells. Probable remote traumatic defect at the left lamina papyracea. No mastoid effusion. Inner ear structures within normal limits.  Bone marrow signal  intensity within normal limits. Scalp soft tissues are unremarkable.  IMPRESSION: Normal brain MRI with no acute intracranial infarct or other process identified.   Electronically Signed   By: Jeannine Boga M.D.   On: 05/07/2015 23:28    EKG: Independently reviewed. Normal sinus rhythm  Assessment/Plan Active Problems:   Right sided weakness   Syncope   TIA (transient ischemic attack)   Alcohol abuse  Syncope vs TIA vs seizure - Patient with a syncopal episode at home. She has an elevated EtOH level, history of seizures, and has a right-sided weakness, all for which she will be admitted and further investigated. - Admit to telemetry - Neurology consulted, MRI negative for CVA - Given a syncopal episode will obtain a 2-D echo - EEG given her reported history of seizures - Her right-sided weakness is slowly improving  Alcohol abuse - Place patient on CIWA - Denies history of seizures with stopping drinking,  however is a daily drinker. Endorses "shakes" when she stops drinking   Diet: NPO Fluids: NS DVT Prophylaxis: Lovenox  Code Status: Full  Family Communication: d/w husband bedside  Disposition Plan: admit to tele  Time spent: 41  Ryane Canavan M. Cruzita Lederer, MD [facility] Pager [phone]  If 7PM-7AM, please contact night-coverage [email or web address] Password [facility] 05/07/2015, 11:37 PM

## 2015-05-08 ENCOUNTER — Observation Stay (HOSPITAL_COMMUNITY): Payer: Self-pay

## 2015-05-08 DIAGNOSIS — E876 Hypokalemia: Secondary | ICD-10-CM | POA: Diagnosis present

## 2015-05-08 DIAGNOSIS — R55 Syncope and collapse: Secondary | ICD-10-CM

## 2015-05-08 DIAGNOSIS — R519 Headache, unspecified: Secondary | ICD-10-CM | POA: Diagnosis present

## 2015-05-08 DIAGNOSIS — M6289 Other specified disorders of muscle: Secondary | ICD-10-CM

## 2015-05-08 DIAGNOSIS — R51 Headache: Secondary | ICD-10-CM

## 2015-05-08 DIAGNOSIS — E785 Hyperlipidemia, unspecified: Secondary | ICD-10-CM | POA: Diagnosis present

## 2015-05-08 DIAGNOSIS — I6789 Other cerebrovascular disease: Secondary | ICD-10-CM

## 2015-05-08 LAB — LIPID PANEL
CHOLESTEROL: 241 mg/dL — AB (ref 0–200)
HDL: 71 mg/dL (ref >40)
LDL Cholesterol: 156 mg/dL — ABNORMAL HIGH (ref 0–99)
Total CHOL/HDL Ratio: 3.4 RATIO
Triglycerides: 68 mg/dL (ref <150)
VLDL: 14 mg/dL (ref 0–40)

## 2015-05-08 LAB — BASIC METABOLIC PANEL
Anion gap: 8 (ref 5–15)
BUN: 8 mg/dL (ref 6–20)
CALCIUM: 8.9 mg/dL (ref 8.9–10.3)
CO2: 23 mmol/L (ref 22–32)
Chloride: 111 mmol/L (ref 101–111)
Creatinine, Ser: 0.73 mg/dL (ref 0.44–1.00)
GFR calc Af Amer: >60 mL/min (ref >60)
GFR calc non Af Amer: >60 mL/min (ref >60)
Glucose, Bld: 83 mg/dL (ref 65–99)
Potassium: 3.9 mmol/L (ref 3.5–5.1)
Sodium: 142 mmol/L (ref 135–145)

## 2015-05-08 LAB — CBC
HCT: 35 % — ABNORMAL LOW (ref 36.0–46.0)
Hemoglobin: 12.6 g/dL (ref 12.0–15.0)
MCH: 32.3 pg (ref 26.0–34.0)
MCHC: 36 g/dL (ref 30.0–36.0)
MCV: 89.7 fL (ref 78.0–100.0)
PLATELETS: 133 10*3/uL — AB (ref 150–400)
RBC: 3.9 MIL/uL (ref 3.87–5.11)
RDW: 14.1 % (ref 11.5–15.5)
WBC: 5 10*3/uL (ref 4.0–10.5)

## 2015-05-08 LAB — RAPID URINE DRUG SCREEN, HOSP PERFORMED
Amphetamines: NOT DETECTED
BARBITURATES: NOT DETECTED
Benzodiazepines: NOT DETECTED
COCAINE: POSITIVE — AB
Opiates: NOT DETECTED
Tetrahydrocannabinol: POSITIVE — AB

## 2015-05-08 MED ORDER — ASPIRIN EC 325 MG PO TBEC
325.0000 mg | DELAYED_RELEASE_TABLET | Freq: Every day | ORAL | Status: DC
  Administered 2015-05-08 – 2015-05-09 (×2): 325 mg via ORAL
  Filled 2015-05-08: qty 1

## 2015-05-08 MED ORDER — DIPHENHYDRAMINE HCL 25 MG PO CAPS
50.0000 mg | ORAL_CAPSULE | Freq: Four times a day (QID) | ORAL | Status: DC | PRN
  Administered 2015-05-08: 50 mg via ORAL
  Filled 2015-05-08: qty 2

## 2015-05-08 MED ORDER — KETOROLAC TROMETHAMINE 30 MG/ML IJ SOLN
30.0000 mg | Freq: Once | INTRAMUSCULAR | Status: AC
  Administered 2015-05-08: 30 mg via INTRAVENOUS
  Filled 2015-05-08: qty 1

## 2015-05-08 MED ORDER — ASPIRIN 300 MG RE SUPP
300.0000 mg | Freq: Every day | RECTAL | Status: DC
  Administered 2015-05-08: 300 mg via RECTAL
  Filled 2015-05-08 (×2): qty 1

## 2015-05-08 MED ORDER — KETOROLAC TROMETHAMINE 30 MG/ML IJ SOLN
30.0000 mg | Freq: Four times a day (QID) | INTRAMUSCULAR | Status: DC | PRN
  Administered 2015-05-08: 30 mg via INTRAVENOUS
  Filled 2015-05-08: qty 1

## 2015-05-08 MED ORDER — METOCLOPRAMIDE HCL 5 MG/ML IJ SOLN
10.0000 mg | Freq: Once | INTRAMUSCULAR | Status: AC
  Administered 2015-05-08: 10 mg via INTRAVENOUS
  Filled 2015-05-08: qty 2

## 2015-05-08 MED ORDER — DIPHENHYDRAMINE HCL 50 MG/ML IJ SOLN
25.0000 mg | Freq: Once | INTRAMUSCULAR | Status: AC
  Administered 2015-05-08: 25 mg via INTRAVENOUS
  Filled 2015-05-08: qty 1

## 2015-05-08 MED ORDER — ASPIRIN EC 325 MG PO TBEC: 325.0000 mg | DELAYED_RELEASE_TABLET | Freq: Every day | ORAL | Status: DC

## 2015-05-08 NOTE — Progress Notes (Signed)
EEG completed; results pending.    

## 2015-05-08 NOTE — Procedures (Signed)
ELECTROENCEPHALOGRAM REPORT   Patient: Janet Ramos       Room #: 7A12 EEG No. ID: [id number] Age: 54 y.o.        Sex: female Referring Physician: Janann Colonel Report Date:  05/08/2015        Interpreting Physician: Alexis Goodell  History: [name] is an 54 y.o. female with an episode of unresponsiveness followed by right sided weakness  Medications:  Scheduled: . aspirin  325 mg Oral Daily  . enoxaparin (LOVENOX) injection  40 mg Subcutaneous Daily  . folic acid  1 mg Oral Daily  . multivitamin with minerals  1 tablet Oral Daily  . thiamine  100 mg Oral Daily   Or  . thiamine  100 mg Intravenous Daily    Conditions of Recording:  This is a 16 channel EEG carried out with the patient in the awake and drowsy states.  Description:  The waking background activity consists of a low voltage, symmetrical, fairly well organized, 9 Hz alpha activity, seen from the parieto-occipital and posterior temporal regions.  Low voltage fast activity, poorly organized, is seen anteriorly and is at times superimposed on more posterior regions.  A mixture of theta and alpha rhythms are seen from the central and temporal regions.  There were also intermittent 5-6 second episodes of frontal slowing consisting of polymorphic delta activity.  Although these episodes were bilateral they were more prominent and of higher voltage over the right hemisphere.   The patient drowses with slowing to irregular, low voltage theta and beta activity.   Stage II sleep is not obtained. Hyperventilation and intermittent photic stimulation were not performed.   IMPRESSION: This is an abnormal electroencephalogram due to episodes of what appears to be FIRDA.  The fact that there is a right hemispheric predominance does suggest the possibility of a more focal lesion with possible epileptogenic potential.  Clinical correlation is recommended.     Alexis Goodell, MD [facility] [phone] 05/08/2015, 11:47 AM

## 2015-05-08 NOTE — Progress Notes (Addendum)
[facility] PROGRESS NOTE  COLLIN HENDLEY LYY:[id number] DOB: [birth year] DOA: 05/07/2015 PCP: No primary care provider on file.  Assessment/Plan:   Right sided weakness: Exam inconsistent. Likely functional and drug and alcohol related. MRI negative. Echocardiogram and carotid Dopplers pending. May continue aspirin at discharge Active Problems:   Syncope: EEG without definite seizure focus. Per neuro no AEDs   Polysubstance abuse:  Blood alcohol level 188. Urine drug screen positive for THC and cocaine. Patient admits to marijuana use but denies regular cocaine use. She admits having gone to a party recently however. Offered social work consult but patient declines. Needs to quit using drugs and abusing alcohol.   Headache: Toradol as needed   Hyperlipidemia:  Doubt patient would be compliant with statin. Unlikely to have had a stroke. Neurology not recommending statin at this time.   Code Status:  full Family Communication:   Disposition Plan:  Home tomorrow after stroke workup complete.  Consultants:  Neurology  Procedures:     Antibiotics:    HPI/Subjective: Complaining of right-sided headache. Reportedly told neurology she still had right-sided weakness, but admits that she has none currently. (Had inconsistent right arm weakness earlier today, but able to pick up the phone when distracted)  Objective: Filed Vitals:   05/08/15 1245  BP: 121/89  Pulse: 77  Temp: 98.6 F (37 C)  Resp: 16   No intake or output data in the 24 hours ending 05/08/15 1334 Filed Weights   05/07/15 2141  Weight: 81.647 kg (180 lb)    Exam:   General:  Unkempt. Alert and oriented.  Cardiovascular: Regular rate rhythm without murmurs gallops rubs  Respiratory: Clear to auscultation bilaterally without wheezes rhonchi or rales  Abdomen: Soft nontender nondistended  Ext: No clubbing cyanosis or edema  Neurologic: Strength 5 out of 5 throughout. Cranial nerves intact.  Basic  Metabolic Panel:  Recent Labs Lab 05/07/15 2126 05/07/15 2154 05/08/15 0414  NA 134* 144 142  K 3.2* 3.4* 3.9  CL 103 109 111  CO2 18*  --  23  GLUCOSE 129* 125* 83  BUN 7 7 8   CREATININE 0.76 1.00 0.73  CALCIUM 8.7*  --  8.9   Liver Function Tests:  Recent Labs Lab 05/07/15 2126  AST 49*  ALT 31  ALKPHOS 58  BILITOT 0.6  PROT 7.1  ALBUMIN 3.8   No results for input(s): LIPASE, AMYLASE in the last 168 hours. No results for input(s): AMMONIA in the last 168 hours. CBC:  Recent Labs Lab 05/07/15 2126 05/07/15 2154 05/08/15 0414  WBC 6.7  --  5.0  NEUTROABS 1.7  --   --   HGB 13.7 14.6 12.6  HCT 37.3 43.0 35.0*  MCV 89.7  --  89.7  PLT 150  --  133*   Cardiac Enzymes: No results for input(s): CKTOTAL, CKMB, CKMBINDEX, TROPONINI in the last 168 hours. BNP (last 3 results) No results for input(s): BNP in the last 8760 hours.  ProBNP (last 3 results) No results for input(s): PROBNP in the last 8760 hours.  CBG: No results for input(s): GLUCAP in the last 168 hours.  No results found for this or any previous visit (from the past 240 hour(s)).   Studies: Ct Head Wo Contrast  05/07/2015   CLINICAL DATA:  Code stroke. Right-sided weakness, slurred speech. Headache. Fall.  EXAM: CT HEAD WITHOUT CONTRAST  CT CERVICAL SPINE WITHOUT CONTRAST  TECHNIQUE: Multidetector CT imaging of the head and cervical spine was performed following the  standard protocol without intravenous contrast. Multiplanar CT image reconstructions of the cervical spine were also generated.  COMPARISON:  Head and cervical spine CT 10/06/2014  FINDINGS: CT HEAD FINDINGS  No intracranial hemorrhage, mass effect, or midline shift. No hydrocephalus. The basilar cisterns are patent. No evidence of territorial infarct. No intracranial fluid collection. Calvarium is intact. 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 Mastoid air cells are well aerated.  CT CERVICAL  SPINE FINDINGS  No acute fracture or subluxation. Vertebral body heights are preserved. Mild endplate spurring throughout the cervical spine with minimal peripheral disc calcification at multiple levels. The dens is intact. There are no jumped or perched facets. No prevertebral soft tissue edema.  IMPRESSION: 1.  No acute intracranial abnormality. 2. No fracture or subluxation of the cervical spine. These results were called by telephone at the time of interpretation on 05/07/2015 at 9:52 pm to Dr. Janann Colonel , who verbally acknowledged these results.   Electronically Signed   By: Jeb Levering M.D.   On: 05/07/2015 21:53   Ct Cervical Spine Wo Contrast  05/07/2015   CLINICAL DATA:  Code stroke. Right-sided weakness, slurred speech. Headache. Fall.  EXAM: CT HEAD WITHOUT CONTRAST  CT CERVICAL SPINE WITHOUT CONTRAST  TECHNIQUE: Multidetector CT imaging of the head and cervical spine was performed following the standard protocol without intravenous contrast. Multiplanar CT image reconstructions of the cervical spine were also generated.  COMPARISON:  Head and cervical spine CT 10/06/2014  FINDINGS: CT HEAD FINDINGS  No intracranial hemorrhage, mass effect, or midline shift. No hydrocephalus. The basilar cisterns are patent. No evidence of territorial infarct. No intracranial fluid collection. Calvarium is intact. 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 Mastoid air cells are well aerated.  CT CERVICAL SPINE FINDINGS  No acute fracture or subluxation. Vertebral body heights are preserved. Mild endplate spurring throughout the cervical spine with minimal peripheral disc calcification at multiple levels. The dens is intact. There are no jumped or perched facets. No prevertebral soft tissue edema.  IMPRESSION: 1.  No acute intracranial abnormality. 2. No fracture or subluxation of the cervical spine. These results were called by telephone at the time of interpretation on  05/07/2015 at 9:52 pm to Dr. Janann Colonel , who verbally acknowledged these results.   Electronically Signed   By: Jeb Levering M.D.   On: 05/07/2015 21:53   Mr Brain Wo Contrast  05/07/2015   CLINICAL DATA:  Initial evaluation for acute right-sided weakness, found down, slurred speech.  EXAM: MRI HEAD WITHOUT CONTRAST  TECHNIQUE: Multiplanar, multiecho pulse sequences of the brain and surrounding structures were obtained without intravenous contrast.  COMPARISON:  Prior CT from earlier the same day.  FINDINGS: The CSF containing spaces are within normal limits for patient age. No focal parenchymal signal abnormality is identified. No mass lesion, midline shift, or extra-axial fluid collection. Ventricles are normal in size without evidence of hydrocephalus.  No diffusion-weighted signal abnormality is identified to suggest acute intracranial infarct. Gray-white matter differentiation is maintained. Normal flow voids are seen within the intracranial vasculature. No intracranial hemorrhage identified.  The craniocervical junction within normal limits. Mild degenerative changes present within the visualized upper cervical spine. Pituitary gland is within normal limits. Pituitary stalk is midline. Left ocular implant noted. Right globe within normal limits.  The bone marrow signal intensity is normal. Calvarium is intact. Visualized upper cervical spine is within normal limits.  Scalp soft tissues are unremarkable.  Mild  mucosal thickening within the ethmoidal air cells. Probable remote traumatic defect at the left lamina papyracea. No mastoid effusion. Inner ear structures within normal limits.  Bone marrow signal intensity within normal limits. Scalp soft tissues are unremarkable.  IMPRESSION: Normal brain MRI with no acute intracranial infarct or other process identified.   Electronically Signed   By: Jeannine Boga M.D.   On: 05/07/2015 23:28    Scheduled Meds: . aspirin  325 mg Oral Daily  . enoxaparin  (LOVENOX) injection  40 mg Subcutaneous Daily  . folic acid  1 mg Oral Daily  . multivitamin with minerals  1 tablet Oral Daily  . thiamine  100 mg Oral Daily   Or  . thiamine  100 mg Intravenous Daily   Continuous Infusions:   Time spent: 25 minutes  [facility]  [email or web address], password [facility] 05/08/2015, 1:34 PM

## 2015-05-08 NOTE — Clinical Social Work Note (Signed)
CSW consult acknowledged:  Clinical Social Worker received a consult indicating patient could not afford medication. Please refer to [facility] for all medication assistance. CSW to notify RNCM.   Clinical Social Worker will sign off for now as social work intervention is no longer needed. Please consult [place] again if new need arises.  Glendon Axe, MSW, LCSWA [phone] 05/08/2015 9:32 AM

## 2015-05-08 NOTE — Progress Notes (Signed)
Subjective: patient states her right arm is slightly improved.    Objective: Current vital signs: BP 121/89 mmHg  Pulse 77  Temp(Src) 98.6 F (37 C) (Oral)  Resp 16  Wt 180 lb (81.647 kg)  SpO2 97%  LMP 03/15/2011 Vital signs in last 24 hours: Temp:  [97.6 F (36.4 C)-98.7 F (37.1 C)] 98.6 F (37 C) (07/25 1245) Pulse Rate:  [72-93] 77 (07/25 1245) Resp:  [16-24] 16 (07/25 1245) BP: (108-137)/(63-91) 121/89 mmHg (07/25 1245) SpO2:  [93 %-99 %] 97 % (07/25 1245) Weight:  [180 lb (81.647 kg)] 180 lb (81.647 kg) (07/24 2141)  Intake/Output from previous day:   Intake/Output this shift:   Nutritional status: Diet regular Room service appropriate?: Yes; Fluid consistency:: Thin  Neurologic Exam: General: Mental Status: Alert, oriented, thought content appropriate.  Speech fluent without evidence of aphasia.  Able to follow 3 step commands without difficulty. Cranial Nerves: II:  Visual fields grossly normal, pupils equal, round, reactive to light and accommodation III,IV, VI: ptosis not present, extra-ocular motions intact bilaterally V,VII: smile symmetric, facial light touch sensation normal bilaterally VIII: hearing normal bilaterally IX,X: uvula rises symmetrically XI: bilateral shoulder shrug XII: midline tongue extension without atrophy or fasciculations  Motor: Right : Upper extremity   5/5    Left:     Upper extremity   5/5  Lower extremity   5/5     Lower extremity   5/5 --patient at first would not move her arm or or leg.  When held above her head she would avoid letting it hit her head.  Testing showed full strength when patient was asked to do multiple movements at once.  As I was leaving the room patient reached with her right hand to answer the phone.   Right leg has full strength.   Tone and bulk:normal tone throughout; no atrophy noted Sensory: Pinprick and light touch intact throughout, bilaterally Deep Tendon Reflexes:  Right: Upper Extremity   Left:  Upper extremity   biceps (C-5 to C-6) 2/4   biceps (C-5 to C-6) 2/4 tricep (C7) 2/4    triceps (C7) 2/4 Brachioradialis (C6) 2/4  Brachioradialis (C6) 2/4  Lower Extremity Lower Extremity  quadriceps (L-2 to L-4) 2/4   quadriceps (L-2 to L-4) 2/4 Achilles (S1) 1/4   Achilles (S1) 1/4  Plantars: Right: downgoing   Left: downgoing     Lab Results: Basic Metabolic Panel:  Recent Labs Lab 05/07/15 2126 05/07/15 2154 05/08/15 0414  NA 134* 144 142  K 3.2* 3.4* 3.9  CL 103 109 111  CO2 18*  --  23  GLUCOSE 129* 125* 83  BUN 7 7 8   CREATININE 0.76 1.00 0.73  CALCIUM 8.7*  --  8.9    Liver Function Tests:  Recent Labs Lab 05/07/15 2126  AST 49*  ALT 31  ALKPHOS 58  BILITOT 0.6  PROT 7.1  ALBUMIN 3.8   No results for input(s): LIPASE, AMYLASE in the last 168 hours. No results for input(s): AMMONIA in the last 168 hours.  CBC:  Recent Labs Lab 05/07/15 2126 05/07/15 2154 05/08/15 0414  WBC 6.7  --  5.0  NEUTROABS 1.7  --   --   HGB 13.7 14.6 12.6  HCT 37.3 43.0 35.0*  MCV 89.7  --  89.7  PLT 150  --  133*    Cardiac Enzymes: No results for input(s): CKTOTAL, CKMB, CKMBINDEX, TROPONINI in the last 168 hours.  Lipid Panel:  Recent Labs Lab 05/08/15 0414  CHOL 241*  TRIG 68  HDL 71  CHOLHDL 3.4  VLDL 14  LDLCALC 156*    CBG: No results for input(s): GLUCAP in the last 168 hours.  Microbiology: Results for orders placed or performed during the hospital encounter of 09/16/14  Surgical pcr screen     Status: Abnormal   Collection Time: 09/16/14  5:52 PM  Result Value Ref Range Status   MRSA, PCR NEGATIVE NEGATIVE Final   Staphylococcus aureus POSITIVE (A) NEGATIVE Final    Comment:        The Xpert SA Assay (FDA approved for NASAL specimens in patients over 34 years of age), is one component of a comprehensive surveillance program.  Test performance has been validated by [employer] for patients greater than or equal to 41 year  old. It is not intended to diagnose infection nor to guide or monitor treatment.     Coagulation Studies:  Recent Labs  05/07/15 2126  LABPROT 14.8  INR 1.14    Imaging: Ct Head Wo Contrast  05/07/2015   CLINICAL DATA:  Code stroke. Right-sided weakness, slurred speech. Headache. Fall.  EXAM: CT HEAD WITHOUT CONTRAST  CT CERVICAL SPINE WITHOUT CONTRAST  TECHNIQUE: Multidetector CT imaging of the head and cervical spine was performed following the standard protocol without intravenous contrast. Multiplanar CT image reconstructions of the cervical spine were also generated.  COMPARISON:  Head and cervical spine CT 10/06/2014  FINDINGS: CT HEAD FINDINGS  No intracranial hemorrhage, mass effect, or midline shift. No hydrocephalus. The basilar cisterns are patent. No evidence of territorial infarct. No intracranial fluid collection. Calvarium is intact. 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 Mastoid air cells are well aerated.  CT CERVICAL SPINE FINDINGS  No acute fracture or subluxation. Vertebral body heights are preserved. Mild endplate spurring throughout the cervical spine with minimal peripheral disc calcification at multiple levels. The dens is intact. There are no jumped or perched facets. No prevertebral soft tissue edema.  IMPRESSION: 1.  No acute intracranial abnormality. 2. No fracture or subluxation of the cervical spine. These results were called by telephone at the time of interpretation on 05/07/2015 at 9:52 pm to Dr. Janann Colonel , who verbally acknowledged these results.   Electronically Signed   By: Jeb Levering M.D.   On: 05/07/2015 21:53   Ct Cervical Spine Wo Contrast  05/07/2015   CLINICAL DATA:  Code stroke. Right-sided weakness, slurred speech. Headache. Fall.  EXAM: CT HEAD WITHOUT CONTRAST  CT CERVICAL SPINE WITHOUT CONTRAST  TECHNIQUE: Multidetector CT imaging of the head and cervical spine was performed following the standard  protocol without intravenous contrast. Multiplanar CT image reconstructions of the cervical spine were also generated.  COMPARISON:  Head and cervical spine CT 10/06/2014  FINDINGS: CT HEAD FINDINGS  No intracranial hemorrhage, mass effect, or midline shift. No hydrocephalus. The basilar cisterns are patent. No evidence of territorial infarct. No intracranial fluid collection. Calvarium is intact. 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 Mastoid air cells are well aerated.  CT CERVICAL SPINE FINDINGS  No acute fracture or subluxation. Vertebral body heights are preserved. Mild endplate spurring throughout the cervical spine with minimal peripheral disc calcification at multiple levels. The dens is intact. There are no jumped or perched facets. No prevertebral soft tissue edema.  IMPRESSION: 1.  No acute intracranial abnormality. 2. No fracture or subluxation of the cervical spine. These results were called by telephone at the time  of interpretation on 05/07/2015 at 9:52 pm to Dr. Janann Colonel , who verbally acknowledged these results.   Electronically Signed   By: Jeb Levering M.D.   On: 05/07/2015 21:53   Mr Brain Wo Contrast  05/07/2015   CLINICAL DATA:  Initial evaluation for acute right-sided weakness, found down, slurred speech.  EXAM: MRI HEAD WITHOUT CONTRAST  TECHNIQUE: Multiplanar, multiecho pulse sequences of the brain and surrounding structures were obtained without intravenous contrast.  COMPARISON:  Prior CT from earlier the same day.  FINDINGS: The CSF containing spaces are within normal limits for patient age. No focal parenchymal signal abnormality is identified. No mass lesion, midline shift, or extra-axial fluid collection. Ventricles are normal in size without evidence of hydrocephalus.  No diffusion-weighted signal abnormality is identified to suggest acute intracranial infarct. Gray-white matter differentiation is maintained. Normal flow voids are seen  within the intracranial vasculature. No intracranial hemorrhage identified.  The craniocervical junction within normal limits. Mild degenerative changes present within the visualized upper cervical spine. Pituitary gland is within normal limits. Pituitary stalk is midline. Left ocular implant noted. Right globe within normal limits.  The bone marrow signal intensity is normal. Calvarium is intact. Visualized upper cervical spine is within normal limits.  Scalp soft tissues are unremarkable.  Mild mucosal thickening within the ethmoidal air cells. Probable remote traumatic defect at the left lamina papyracea. No mastoid effusion. Inner ear structures within normal limits.  Bone marrow signal intensity within normal limits. Scalp soft tissues are unremarkable.  IMPRESSION: Normal brain MRI with no acute intracranial infarct or other process identified.   Electronically Signed   By: Jeannine Boga M.D.   On: 05/07/2015 23:28    Medications:  Scheduled: . aspirin  325 mg Oral Daily  . enoxaparin (LOVENOX) injection  40 mg Subcutaneous Daily  . folic acid  1 mg Oral Daily  . multivitamin with minerals  1 tablet Oral Daily  . thiamine  100 mg Oral Daily   Or  . thiamine  100 mg Intravenous Daily   EEG: IMPRESSION: This is an abnormal electroencephalogram due to episodes of what appears to be FIRDA. The fact that there is a right hemispheric predominance does suggest the possibility of a more focal lesion with possible epileptogenic potential. Clinical correlation is recommended.   Assessment/Plan:  54 YO female with history substance abuse cocaine and THC positive presenting to ED with slurred speech and right sided weakness. ETOH level was 188 on arrival to ED. MRI brain showed no acute infarct and EEG showed no epileptiform activity. Exam shows inconstant right arm weakness and full strength when distracted. Echo and Carotids pending.    Recommend: 1) Echo and Carotids 2) If above are  normal no further recommendations.     Etta Quill PA-C [facility] [phone]  05/08/2015, 1:14 PM Patient seen and examined together with physician assistant and I concur with the assessment and plan.  Dorian Pod, MD

## 2015-05-08 NOTE — Progress Notes (Signed)
Nutrition Brief Note  Patient identified on the Malnutrition Screening Tool (MST) Report  Wt Readings from Last 15 Encounters:  05/07/15 180 lb (81.647 kg)  04/02/15 180 lb (81.647 kg)  03/08/15 180 lb (81.647 kg)  09/16/14 197 lb 1.5 oz (89.4 kg)  09/01/13 180 lb (81.647 kg)   Janet Ramos is a 54 y.o. female without extensive significant medical history, alcohol abuse, presents to the emergency room with the chief complaint of a possible stroke. Per her husband, she was doing well early in the evening, and he heard her falling  Pt confirms UBW of 180#. She reports she may have lost weight "because it was hot outside", however, wt hx does not reveal wt loss. Pt denies no changes to her appetite or eating habits.   Reviewed SLP note on 05/08/15; she has been advanced to a regular diet. Pt reports no chewing or swallowing difficulties currently or PTA.   Body mass index is 30.88 kg/(m^2). Patient meets criteria for obesity, class I based on current BMI.   Current diet order is regular, patient is consuming approximately n/a% of meals at this time. Labs and medications reviewed.   No nutrition interventions warranted at this time. If nutrition issues arise, please consult RD.   Tanica Gaige A. Jimmye Norman, RD, LDN, CDE Pager: [phone] After hours Pager: [phone]

## 2015-05-08 NOTE — Progress Notes (Signed)
Pt arrived to unit alert and oriented x4. Oriented to room and unit. Questions answered. VS obtained. Will continue to monitor. Bobbye Charleston, RN

## 2015-05-08 NOTE — Evaluation (Signed)
Clinical/Bedside Swallow Evaluation Patient Details  Name: ABBEGAYLE DENAULT MRN: [id number] Date of Birth: 1961-03-27  Today's Date: 05/08/2015 Time: SLP Start Time (ACUTE ONLY): 35 SLP Stop Time (ACUTE ONLY): 1040 SLP Time Calculation (min) (ACUTE ONLY): 10 min  Past Medical History:  Past Medical History  Diagnosis Date  . Trichimoniasis 2010   Past Surgical History:  Past Surgical History  Procedure Laterality Date  . Cesarean section  1990  . Tubaligation  1994  . Ruptured globe exploration and repair Left 09/16/2014    Procedure:  Ruptured Globe Repair Left Eye;  Surgeon: Lamonte Sakai, MD;  Location: [place];  Service: Ophthalmology;  Laterality: Left;   HPI:  Pt is a 54 year old female  with a syncopal episode at home. She has an elevated EtOH level, history of seizures, and has a right-sided weakness. MRi is normal. Pt reports difficulty swallowing.    Assessment / Plan / Recommendation Clinical Impression  Pt demonstrates normal swallow function. She reports difficulty swallowing as occasional phlegm in her throat. Pt may initiate a regular diet and thin liquids. No SLP f/u needed.     Aspiration Risk  Mild    Diet Recommendation Age appropriate regular solids;Thin   Medication Administration: Whole meds with liquid    Other  Recommendations     Follow Up Recommendations       Frequency and Duration        Pertinent Vitals/Pain NA    SLP Swallow Goals     Swallow Study Prior Functional Status       General Other Pertinent Information: Pt is a 54 year old female  with a syncopal episode at home. She has an elevated EtOH level, history of seizures, and has a right-sided weakness. MRi is normal. Pt reports difficulty swallowing.  Type of Study: Bedside swallow evaluation Previous Swallow Assessment: none Diet Prior to this Study: NPO Temperature Spikes Noted: No Respiratory Status: Room air History of Recent Intubation: No Behavior/Cognition:  Alert;Cooperative;Pleasant mood Oral Cavity - Dentition: Adequate natural dentition/normal for age Self-Feeding Abilities: Able to feed self Patient Positioning: Upright in bed Baseline Vocal Quality: Hoarse;Low vocal intensity Volitional Cough: Strong Volitional Swallow: Able to elicit    Oral/Motor/Sensory Function Overall Oral Motor/Sensory Function: Appears within functional limits for tasks assessed   Ice Chips     Thin Liquid Thin Liquid: Within functional limits Presentation: Cup;Straw;Self Fed    Nectar Thick Nectar Thick Liquid: Not tested   Honey Thick Honey Thick Liquid: Not tested   Puree Puree: Not tested   Solid   GO Functional Assessment Tool Used: clinical judgement Functional Limitations: Swallowing Swallow Current Status (K0254): 0 percent impaired, limited or restricted Swallow Goal Status (Y7062): 0 percent impaired, limited or restricted Swallow Discharge Status (B7628): 0 percent impaired, limited or restricted  Solid: Within functional limits       Vivian Neuwirth, [employer] 05/08/2015,10:44 AM

## 2015-05-08 NOTE — Progress Notes (Signed)
  Echocardiogram 2D Echocardiogram has been performed.  Darlina Sicilian M 05/08/2015, 3:33 PM

## 2015-05-09 ENCOUNTER — Observation Stay (HOSPITAL_BASED_OUTPATIENT_CLINIC_OR_DEPARTMENT_OTHER): Payer: Self-pay

## 2015-05-09 DIAGNOSIS — F1910 Other psychoactive substance abuse, uncomplicated: Secondary | ICD-10-CM

## 2015-05-09 DIAGNOSIS — G459 Transient cerebral ischemic attack, unspecified: Secondary | ICD-10-CM

## 2015-05-09 DIAGNOSIS — E785 Hyperlipidemia, unspecified: Secondary | ICD-10-CM

## 2015-05-09 LAB — HEMOGLOBIN A1C
Hgb A1c MFr Bld: 5.3 % (ref 4.8–5.6)
MEAN PLASMA GLUCOSE: 105 mg/dL

## 2015-05-09 MED ORDER — METOCLOPRAMIDE HCL 5 MG/ML IJ SOLN
10.0000 mg | Freq: Once | INTRAMUSCULAR | Status: AC
  Administered 2015-05-09: 10 mg via INTRAVENOUS
  Filled 2015-05-09: qty 2

## 2015-05-09 MED ORDER — DIPHENHYDRAMINE HCL 50 MG/ML IJ SOLN
25.0000 mg | Freq: Once | INTRAMUSCULAR | Status: AC
  Administered 2015-05-09: 25 mg via INTRAVENOUS
  Filled 2015-05-09: qty 1

## 2015-05-09 MED ORDER — ASPIRIN 325 MG PO TBEC: 325.0000 mg | DELAYED_RELEASE_TABLET | Freq: Every day | ORAL | Status: DC

## 2015-05-09 MED ORDER — THIAMINE HCL 100 MG PO TABS: 100.0000 mg | ORAL_TABLET | Freq: Every day | ORAL | Status: DC

## 2015-05-09 MED ORDER — ADULT MULTIVITAMIN W/MINERALS CH: 1.0000 | ORAL_TABLET | Freq: Every day | ORAL | Status: DC

## 2015-05-09 MED ORDER — FOLIC ACID 1 MG PO TABS: 1.0000 mg | ORAL_TABLET | Freq: Every day | ORAL | Status: DC

## 2015-05-09 NOTE — Progress Notes (Signed)
*  PRELIMINARY RESULTS* Vascular Ultrasound Carotid Duplex (Doppler) has been completed.  Preliminary findings: Bilateral:  1-39% ICA stenosis.  Vertebral artery flow is antegrade.      Landry Mellow, RDMS, RVT  05/09/2015, 10:36 AM

## 2015-05-09 NOTE — Progress Notes (Signed)
Discharge orders received, pt for discharge home today, IV and telemetry D/C, D/C instructions and Rx given with verbalized understanding.  Family at bedside to assist pt with discharge. Staff brought pt downstairs via wheelchair.

## 2015-05-09 NOTE — Care Management Note (Signed)
Case Management Note  Patient Details  Name: Janet Ramos MRN: [id number] Date of Birth: 1961-09-09  Subjective/Objective:                    Action/Plan: Met with patient to discuss discharge needs. Patient does not have a PCP, so a list of free/reduced clinics was provided. Per conversation with Dr Janet Ramos, patient will not be discharging home on any prescription medications, so no medication assistance is needed at this time. Patient is being followed by Janet Ramos in [employer].  Expected Discharge Date:                  Expected Discharge Plan:  Home/Self Care (Patient is from home with spouse)  In-House Referral:     Discharge planning Services  CM Consult, [facility]  Post Acute Care Choice:    Choice offered to:  Patient  DME Arranged:    DME Agency:     HH Arranged:    [place] Agency:     Status of Service:  Completed, signed off  Medicare Important Message Given:    Date Medicare IM Given:    Medicare IM give by:    Date Additional Medicare IM Given:    Additional Medicare Important Message give by:     If discussed at [place] of Stay Meetings, dates discussed:    Additional Comments:  Janet Baptise, RN 05/09/2015, 9:46 AM

## 2015-05-09 NOTE — Discharge Summary (Signed)
Physician Discharge Summary  Janet Ramos HER:[id number] DOB: [birth year] DOA: 05/07/2015  PCP: No primary care provider on file.- patient to establish with PCP- given list  Admit date: 05/07/2015 Discharge date: 05/09/2015  Time spent: 35 minutes  Recommendations for Outpatient Follow-up:  1. Stop drug and alcohol  Discharge Diagnoses:  Active Problems:   Right sided weakness   Syncope   Polysubstance abuse   Headache   Hyperlipidemia   Hypokalemia   Discharge Condition: improved  Diet recommendation: cardiac  Filed Weights   05/07/15 2141  Weight: 81.647 kg (180 lb)    History of present illness:  Janet Ramos is a 54 y.o. female without extensive significant medical history, alcohol abuse, presents to the emergency room with the chief complaint of a possible stroke. Per her husband, she was doing well early in the evening, and he heard her falling. Patient remembers being outside reading, and when she walked back inside she felt lightheaded and next thing that she knows was that people were around her. She endorses a history of "seizures" there are usually brought in by stress. Last time she had a seizure was about a year ago. She doesn't have a PCP or seeks care regular basis. She doesn't take any medications on a regular basis. She denies any fever or chills, she denies any chest pain or breathing difficulties. She denies abdominal pain, endorses mild nausea. She denies any diarrhea, denies any blood in her stool, denies any dysuria. In the emergency room, patient had right-sided weakness on presentation, she was admitted as a code stroke, neurology evaluated patient, she underwent an MRI with final read pending, and [facility] was asked for admission for further workup for syncope/CVA/seizure  Hospital Course:  Right sided weakness: Exam inconsistent. Likely functional and drug and alcohol related. MRI negative. Echocardiogram and carotid Dopplers ok.  -continue aspirin at  discharge   Syncope: EEG without definite seizure focus. Per neuro no AEDs  Polysubstance abuse: Blood alcohol level 188. Urine drug screen positive for THC and cocaine. Patient admits to marijuana use but denies regular cocaine use. She admits having gone to a party recently however. Offered social work consult but patient declines. Needs to quit using drugs and abusing alcohol.  Headache: Toradol as needed  Hyperlipidemia: Doubt patient would be compliant with statin. Unlikely to have had a stroke. Neurology not recommending statin at this time.  Procedures:  Echo/carotid  Consultations:  neuro  Discharge Exam: Filed Vitals:   05/09/15 0531  BP: 123/89  Pulse: 69  Temp: 98.2 F (36.8 C)  Resp: 18    General: A+Ox3, NAD   Discharge Instructions   Discharge Instructions    Diet - low sodium heart healthy    Complete by:  As directed      Discharge instructions    Complete by:  As directed   Stop alcohol and drug use     Increase activity slowly    Complete by:  As directed           Current Discharge Medication List    START taking these medications   Details  aspirin EC 325 MG EC tablet Take 1 tablet (325 mg total) by mouth daily. Qty: 30 tablet, Refills: 0    folic acid (FOLVITE) 1 MG tablet Take 1 tablet (1 mg total) by mouth daily. Qty: 30 tablet, Refills: 0    Multiple Vitamin (MULTIVITAMIN WITH MINERALS) TABS tablet Take 1 tablet by mouth daily. Qty: 30 tablet, Refills: 0  thiamine 100 MG tablet Take 1 tablet (100 mg total) by mouth daily. Qty: 30 tablet, Refills: 0      CONTINUE these medications which have NOT CHANGED   Details  ibuprofen (ADVIL,MOTRIN) 200 MG tablet Take 200 mg by mouth every 6 (six) hours as needed for moderate pain.      STOP taking these medications     neomycin-polymyxin b-dexamethasone (MAXITROL) 3.5-10000-0.1 OINT        No Known Allergies Follow-up Information    Please follow up.   Why:  patient to  establish with PCP       The results of significant diagnostics from this hospitalization (including imaging, microbiology, ancillary and laboratory) are listed below for reference.    Significant Diagnostic Studies: Ct Head Wo Contrast  05/07/2015   CLINICAL DATA:  Code stroke. Right-sided weakness, slurred speech. Headache. Fall.  EXAM: CT HEAD WITHOUT CONTRAST  CT CERVICAL SPINE WITHOUT CONTRAST  TECHNIQUE: Multidetector CT imaging of the head and cervical spine was performed following the standard protocol without intravenous contrast. Multiplanar CT image reconstructions of the cervical spine were also generated.  COMPARISON:  Head and cervical spine CT 10/06/2014  FINDINGS: CT HEAD FINDINGS  No intracranial hemorrhage, mass effect, or midline shift. No hydrocephalus. The basilar cisterns are patent. No evidence of territorial infarct. No intracranial fluid collection. Calvarium is intact. 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 Mastoid air cells are well aerated.  CT CERVICAL SPINE FINDINGS  No acute fracture or subluxation. Vertebral body heights are preserved. Mild endplate spurring throughout the cervical spine with minimal peripheral disc calcification at multiple levels. The dens is intact. There are no jumped or perched facets. No prevertebral soft tissue edema.  IMPRESSION: 1.  No acute intracranial abnormality. 2. No fracture or subluxation of the cervical spine. These results were called by telephone at the time of interpretation on 05/07/2015 at 9:52 pm to Dr. Janann Colonel , who verbally acknowledged these results.   Electronically Signed   By: Jeb Levering M.D.   On: 05/07/2015 21:53   Ct Cervical Spine Wo Contrast  05/07/2015   CLINICAL DATA:  Code stroke. Right-sided weakness, slurred speech. Headache. Fall.  EXAM: CT HEAD WITHOUT CONTRAST  CT CERVICAL SPINE WITHOUT CONTRAST  TECHNIQUE: Multidetector CT imaging of the head and cervical spine was  performed following the standard protocol without intravenous contrast. Multiplanar CT image reconstructions of the cervical spine were also generated.  COMPARISON:  Head and cervical spine CT 10/06/2014  FINDINGS: CT HEAD FINDINGS  No intracranial hemorrhage, mass effect, or midline shift. No hydrocephalus. The basilar cisterns are patent. No evidence of territorial infarct. No intracranial fluid collection. Calvarium is intact. 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 Mastoid air cells are well aerated.  CT CERVICAL SPINE FINDINGS  No acute fracture or subluxation. Vertebral body heights are preserved. Mild endplate spurring throughout the cervical spine with minimal peripheral disc calcification at multiple levels. The dens is intact. There are no jumped or perched facets. No prevertebral soft tissue edema.  IMPRESSION: 1.  No acute intracranial abnormality. 2. No fracture or subluxation of the cervical spine. These results were called by telephone at the time of interpretation on 05/07/2015 at 9:52 pm to Dr. Janann Colonel , who verbally acknowledged these results.   Electronically Signed   By: Jeb Levering M.D.   On: 05/07/2015 21:53   Mr Brain Wo Contrast  05/07/2015   CLINICAL DATA:  Initial evaluation for acute right-sided weakness, found down, slurred speech.  EXAM: MRI HEAD WITHOUT CONTRAST  TECHNIQUE: Multiplanar, multiecho pulse sequences of the brain and surrounding structures were obtained without intravenous contrast.  COMPARISON:  Prior CT from earlier the same day.  FINDINGS: The CSF containing spaces are within normal limits for patient age. No focal parenchymal signal abnormality is identified. No mass lesion, midline shift, or extra-axial fluid collection. Ventricles are normal in size without evidence of hydrocephalus.  No diffusion-weighted signal abnormality is identified to suggest acute intracranial infarct. Gray-white matter differentiation is  maintained. Normal flow voids are seen within the intracranial vasculature. No intracranial hemorrhage identified.  The craniocervical junction within normal limits. Mild degenerative changes present within the visualized upper cervical spine. Pituitary gland is within normal limits. Pituitary stalk is midline. Left ocular implant noted. Right globe within normal limits.  The bone marrow signal intensity is normal. Calvarium is intact. Visualized upper cervical spine is within normal limits.  Scalp soft tissues are unremarkable.  Mild mucosal thickening within the ethmoidal air cells. Probable remote traumatic defect at the left lamina papyracea. No mastoid effusion. Inner ear structures within normal limits.  Bone marrow signal intensity within normal limits. Scalp soft tissues are unremarkable.  IMPRESSION: Normal brain MRI with no acute intracranial infarct or other process identified.   Electronically Signed   By: Jeannine Boga M.D.   On: 05/07/2015 23:28    Microbiology: No results found for this or any previous visit (from the past 240 hour(s)).   Labs: Basic Metabolic Panel:  Recent Labs Lab 05/07/15 2126 05/07/15 2154 05/08/15 0414  NA 134* 144 142  K 3.2* 3.4* 3.9  CL 103 109 111  CO2 18*  --  23  GLUCOSE 129* 125* 83  BUN 7 7 8   CREATININE 0.76 1.00 0.73  CALCIUM 8.7*  --  8.9   Liver Function Tests:  Recent Labs Lab 05/07/15 2126  AST 49*  ALT 31  ALKPHOS 58  BILITOT 0.6  PROT 7.1  ALBUMIN 3.8   No results for input(s): LIPASE, AMYLASE in the last 168 hours. No results for input(s): AMMONIA in the last 168 hours. CBC:  Recent Labs Lab 05/07/15 2126 05/07/15 2154 05/08/15 0414  WBC 6.7  --  5.0  NEUTROABS 1.7  --   --   HGB 13.7 14.6 12.6  HCT 37.3 43.0 35.0*  MCV 89.7  --  89.7  PLT 150  --  133*   Cardiac Enzymes: No results for input(s): CKTOTAL, CKMB, CKMBINDEX, TROPONINI in the last 168 hours. BNP: BNP (last 3 results) No results for  input(s): BNP in the last 8760 hours.  ProBNP (last 3 results) No results for input(s): PROBNP in the last 8760 hours.  CBG: No results for input(s): GLUCAP in the last 168 hours.     SignedEulogio Bear  [facility] 05/09/2015, 10:46 AM

## 2015-06-15 HISTORY — PX: EYE SURGERY: SHX253

## 2015-08-28 ENCOUNTER — Encounter: Payer: Self-pay | Admitting: Internal Medicine

## 2015-08-28 ENCOUNTER — Ambulatory Visit (INDEPENDENT_AMBULATORY_CARE_PROVIDER_SITE_OTHER): Payer: Self-pay | Admitting: Internal Medicine

## 2015-08-28 VITALS — BP 126/78 | HR 76 | Ht 64.0 in | Wt 190.0 lb

## 2015-08-28 DIAGNOSIS — B356 Tinea cruris: Secondary | ICD-10-CM

## 2015-08-28 DIAGNOSIS — N760 Acute vaginitis: Secondary | ICD-10-CM

## 2015-08-28 DIAGNOSIS — M25562 Pain in left knee: Secondary | ICD-10-CM

## 2015-08-28 DIAGNOSIS — A499 Bacterial infection, unspecified: Secondary | ICD-10-CM

## 2015-08-28 DIAGNOSIS — B9689 Other specified bacterial agents as the cause of diseases classified elsewhere: Secondary | ICD-10-CM

## 2015-08-28 LAB — POCT WET PREP WITH KOH
KOH Prep POC: NEGATIVE
RBC Wet Prep HPF POC: NEGATIVE
TRICHOMONAS UA: NEGATIVE
Yeast Wet Prep HPF POC: NEGATIVE

## 2015-08-28 LAB — POCT URINALYSIS DIPSTICK
Bilirubin, UA: NEGATIVE
Blood, UA: NEGATIVE
GLUCOSE UA: NEGATIVE
Ketones, UA: NEGATIVE
LEUKOCYTES UA: NEGATIVE
NITRITE UA: NEGATIVE
Protein, UA: NEGATIVE
Spec Grav, UA: 1.01
Urobilinogen, UA: 0.2
pH, UA: 5

## 2015-08-28 MED ORDER — METRONIDAZOLE 500 MG PO TABS: 500.0000 mg | ORAL_TABLET | Freq: Three times a day (TID) | ORAL | Status: DC

## 2015-08-28 MED ORDER — TERBINAFINE HCL 250 MG PO TABS: 250.0000 mg | ORAL_TABLET | Freq: Every day | ORAL | Status: AC

## 2015-08-28 NOTE — Addendum Note (Signed)
Addended by: Ronn Melena on: 08/28/2015 03:52 PM   Modules accepted: Miquel Dunn

## 2015-08-28 NOTE — Addendum Note (Signed)
Addended by: Marcelino Duster on: 08/28/2015 04:11 PM   Modules accepted: Orders, SmartSet
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
## 2015-08-28 NOTE — Patient Instructions (Addendum)
Do not start Metronidazole until you have not been drinking alcohol for 2 days.  Do not drink while taking Metronidazole and do not restart after stopping to take the medication. Start the Terbinafine a day after you stop the Metronidazole. Increase Ibuprofen to 600 mg twice daily for knee pain--always take with food.

## 2015-08-30 ENCOUNTER — Other Ambulatory Visit: Payer: Self-pay | Admitting: Licensed Clinical Social Worker

## 2015-09-04 ENCOUNTER — Telehealth: Payer: Self-pay | Admitting: Internal Medicine

## 2015-09-04 NOTE — Telephone Encounter (Signed)
Patient called stating she called the pharmacy for Metronidazole but was told no medication was called in. [facility] and everything is okay with the prescription. Patient aware and will go get Rx today.  No drinking alcohol for 48 hours, then start Metronidazole for 7 days.  To remain off alcohol while taking and after finished. Patient aware she has to start Terbinafine 250 mg daily for 14 days once done with Metronidazole.

## 2015-09-18 ENCOUNTER — Telehealth: Payer: Self-pay | Admitting: Internal Medicine

## 2015-09-18 NOTE — Telephone Encounter (Signed)
Janet Ramos of [facility] program called to obtain verification and quantity of Rx: metroNIDAZOLE (FLAGYL) 500 mg tab.  Please call Janet Ramos at [phone].

## 2015-09-18 NOTE — Telephone Encounter (Signed)
Per Dr. Amil Amen it should be Metronidazole 500 mg 1 tablet twice a day for 7 days Juliann Pulse a [place] notified

## 2015-09-18 NOTE — Telephone Encounter (Signed)
Juliann Pulse from [facility] program called wanting clarification of Rx direction and quantity.  metroNIDAZOLE (FLAGYL) 500 mg. Tab.  Please call Juliann Pulse at [phone].

## 2015-09-19 ENCOUNTER — Other Ambulatory Visit: Payer: Self-pay | Admitting: Licensed Clinical Social Worker

## 2015-10-04 ENCOUNTER — Telehealth: Payer: Self-pay

## 2015-10-04 DIAGNOSIS — B356 Tinea cruris: Secondary | ICD-10-CM

## 2015-10-04 MED ORDER — TERBINAFINE HCL 250 MG PO TABS: 250.0000 mg | ORAL_TABLET | Freq: Every day | ORAL | Status: DC

## 2015-10-04 NOTE — Telephone Encounter (Signed)
Patient called because she finished her Flagyl she would like the Terbinafine sent to [facility] on [address]

## 2015-10-05 NOTE — Telephone Encounter (Signed)
Spoke with patient informed her medication was sent into [facility] per her message yesterday. Explained to her that the cost would not be four dollars and if she couldn't afford it we could change to [facility], per patient she would be able to get the prescription from [place].

## 2015-10-10 ENCOUNTER — Telehealth: Payer: Self-pay | Admitting: Internal Medicine

## 2015-10-10 DIAGNOSIS — B356 Tinea cruris: Secondary | ICD-10-CM

## 2015-10-10 NOTE — Telephone Encounter (Signed)
She was supposed to take it to [facility] where it will cost her $10 for the entire 90 days. Have her take the prescription there instead

## 2015-10-10 NOTE — Telephone Encounter (Signed)
Patient called Rx for fungus infection was too high at [facility].  Is there another pharmacy that Rx can be called into.  Patient picked up Rx that [facility].  Please call patient at [phone].

## 2015-10-11 MED ORDER — TERBINAFINE HCL 250 MG PO TABS: 250.0000 mg | ORAL_TABLET | Freq: Every day | ORAL | Status: AC

## 2015-10-11 NOTE — Telephone Encounter (Signed)
Please called Rx Terbinafine 250 mg tablet to [facility] on [address].

## 2015-12-14 ENCOUNTER — Telehealth: Payer: Self-pay | Admitting: Internal Medicine

## 2015-12-14 NOTE — Telephone Encounter (Signed)
Janet Ramos called today to let Dr. Amil Amen know she had her X-ray done for her knee and she was given a CD. Janet Ramos wants to know what she needs to do after this?  Janet Ramos contact number is [phone]. Please advise

## 2015-12-14 NOTE — Telephone Encounter (Signed)
Patient seen one time 08/2015 went for X-ray of knee would like to know next step sent to [facility]

## 2015-12-18 NOTE — Telephone Encounter (Signed)
Left message on machine for patient to call back for message

## 2015-12-18 NOTE — Telephone Encounter (Signed)
Spoke with patient given result and appointment made

## 2016-01-03 ENCOUNTER — Emergency Department (HOSPITAL_COMMUNITY)
Admission: EM | Admit: 2016-01-03 | Discharge: 2016-01-03 | Disposition: A | Discharge status: Home / Self Care | Payer: MEDICAID | Attending: Emergency Medicine | Admitting: Emergency Medicine

## 2016-01-03 ENCOUNTER — Emergency Department (HOSPITAL_COMMUNITY): Payer: MEDICAID

## 2016-01-03 ENCOUNTER — Encounter (HOSPITAL_COMMUNITY): Payer: Self-pay | Admitting: Emergency Medicine

## 2016-01-03 DIAGNOSIS — Z7982 Long term (current) use of aspirin: Secondary | ICD-10-CM | POA: Insufficient documentation

## 2016-01-03 DIAGNOSIS — J069 Acute upper respiratory infection, unspecified: Secondary | ICD-10-CM | POA: Insufficient documentation

## 2016-01-03 DIAGNOSIS — F17210 Nicotine dependence, cigarettes, uncomplicated: Secondary | ICD-10-CM | POA: Insufficient documentation

## 2016-01-03 DIAGNOSIS — Z8619 Personal history of other infectious and parasitic diseases: Secondary | ICD-10-CM | POA: Insufficient documentation

## 2016-01-03 DIAGNOSIS — R091 Pleurisy: Secondary | ICD-10-CM | POA: Insufficient documentation

## 2016-01-03 DIAGNOSIS — Z79899 Other long term (current) drug therapy: Secondary | ICD-10-CM | POA: Insufficient documentation

## 2016-01-03 DIAGNOSIS — R112 Nausea with vomiting, unspecified: Secondary | ICD-10-CM | POA: Insufficient documentation

## 2016-01-03 MED ORDER — ALBUTEROL SULFATE HFA 108 (90 BASE) MCG/ACT IN AERS
2.0000 | INHALATION_SPRAY | RESPIRATORY_TRACT | Status: DC
  Administered 2016-01-03: 2 via RESPIRATORY_TRACT
  Filled 2016-01-03: qty 6.7

## 2016-01-03 MED ORDER — PROMETHAZINE-CODEINE 6.25-10 MG/5ML PO SYRP: 10.0000 mL | ORAL_SOLUTION | ORAL | Status: DC | PRN

## 2016-01-03 MED ORDER — PROMETHAZINE-CODEINE 6.25-10 MG/5ML PO SYRP
5.0000 mL | ORAL_SOLUTION | Freq: Four times a day (QID) | ORAL | Status: DC | PRN
  Administered 2016-01-03: 5 mL via ORAL
  Filled 2016-01-03: qty 5

## 2016-01-03 MED ORDER — IBUPROFEN 600 MG PO TABS: 600.0000 mg | ORAL_TABLET | Freq: Four times a day (QID) | ORAL | Status: DC | PRN

## 2016-01-03 NOTE — Discharge Instructions (Signed)
Pleurisy Pleurisy is an inflammation and swelling of the lining of the lungs (pleura). Because of this inflammation, it hurts to breathe. It can be aggravated by coughing, laughing, or deep breathing. Pleurisy is often caused by an underlying infection or disease.  HOME CARE INSTRUCTIONS  Monitor your pleurisy for any changes. The following actions may help to alleviate any discomfort you are experiencing:  Medicine may help with pain. Only take over-the-counter or prescription medicines for pain, discomfort, or fever as directed by your health care provider.  Only take antibiotic medicine as directed. Make sure to finish it even if you start to feel better. SEEK MEDICAL CARE IF:   Your pain is not controlled with medicine or is increasing.  You have an increase in pus-like (purulent) secretions brought up with coughing. SEEK IMMEDIATE MEDICAL CARE IF:   You have blue or dark lips, fingernails, or toenails.  You are coughing up blood.  You have increased difficulty breathing.  You have continuing pain unrelieved by medicine or pain lasting more than 1 week.  You have pain that radiates into your neck, arms, or jaw.  You develop increased shortness of breath or wheezing.  You develop a fever, rash, vomiting, fainting, or other serious symptoms. MAKE SURE YOU:  Understand these instructions.   Will watch your condition.   Will get help right away if you are not doing well or get worse.    This information is not intended to replace advice given to you by your health care provider. Make sure you discuss any questions you have with your health care provider.   Document Released: 09/30/2005 Document Revised: 06/02/2013 Document Reviewed: 03/14/2013 Elsevier Interactive Patient Education 2016 Elsevier Inc. Upper Respiratory Infection, Adult Most upper respiratory infections (URIs) are a viral infection of the air passages leading to the lungs. A URI affects the nose, throat,  and upper air passages. The most common type of URI is nasopharyngitis and is typically referred to as "the common cold." URIs run their course and usually go away on their own. Most of the time, a URI does not require medical attention, but sometimes a bacterial infection in the upper airways can follow a viral infection. This is called a secondary infection. Sinus and middle ear infections are common types of secondary upper respiratory infections. Bacterial pneumonia can also complicate a URI. A URI can worsen asthma and chronic obstructive pulmonary disease (COPD). Sometimes, these complications can require emergency medical care and may be life threatening.  CAUSES Almost all URIs are caused by viruses. A virus is a type of germ and can spread from one person to another.  RISKS FACTORS You may be at risk for a URI if:   You smoke.   You have chronic heart or lung disease.  You have a weakened defense (immune) system.   You are very young or very old.   You have nasal allergies or asthma.  You work in crowded or poorly ventilated areas.  You work in health care facilities or schools. SIGNS AND SYMPTOMS  Symptoms typically develop 2-3 days after you come in contact with a cold virus. Most viral URIs last 7-10 days. However, viral URIs from the influenza virus (flu virus) can last 14-18 days and are typically more severe. Symptoms may include:   Runny or stuffy (congested) nose.   Sneezing.   Cough.   Sore throat.   Headache.   Fatigue.   Fever.   Loss of appetite.   Pain in your  forehead, behind your eyes, and over your cheekbones (sinus pain).  Muscle aches.  DIAGNOSIS  Your health care provider may diagnose a URI by:  Physical exam.  Tests to check that your symptoms are not due to another condition such as:  Strep throat.  Sinusitis.  Pneumonia.  Asthma. TREATMENT  A URI goes away on its own with time. It cannot be cured with medicines, but  medicines may be prescribed or recommended to relieve symptoms. Medicines may help:  Reduce your fever.  Reduce your cough.  Relieve nasal congestion. HOME CARE INSTRUCTIONS   Take medicines only as directed by your health care provider.   Gargle warm saltwater or take cough drops to comfort your throat as directed by your health care provider.  Use a warm mist humidifier or inhale steam from a shower to increase air moisture. This may make it easier to breathe.  Drink enough fluid to keep your urine clear or pale yellow.   Eat soups and other clear broths and maintain good nutrition.   Rest as needed.   Return to work when your temperature has returned to normal or as your health care provider advises. You may need to stay home longer to avoid infecting others. You can also use a face mask and careful hand washing to prevent spread of the virus.  Increase the usage of your inhaler if you have asthma.   Do not use any tobacco products, including cigarettes, chewing tobacco, or electronic cigarettes. If you need help quitting, ask your health care provider. PREVENTION  The best way to protect yourself from getting a cold is to practice good hygiene.   Avoid oral or hand contact with people with cold symptoms.   Wash your hands often if contact occurs.  There is no clear evidence that vitamin C, vitamin E, echinacea, or exercise reduces the chance of developing a cold. However, it is always recommended to get plenty of rest, exercise, and practice good nutrition.  SEEK MEDICAL CARE IF:   You are getting worse rather than better.   Your symptoms are not controlled by medicine.   You have chills.  You have worsening shortness of breath.  You have brown or red mucus.  You have yellow or brown nasal discharge.  You have pain in your face, especially when you bend forward.  You have a fever.  You have swollen neck glands.  You have pain while swallowing.  You  have white areas in the back of your throat. SEEK IMMEDIATE MEDICAL CARE IF:   You have severe or persistent:  Headache.  Ear pain.  Sinus pain.  Chest pain.  You have chronic lung disease and any of the following:  Wheezing.  Prolonged cough.  Coughing up blood.  A change in your usual mucus.  You have a stiff neck.  You have changes in your:  Vision.  Hearing.  Thinking.  Mood. MAKE SURE YOU:   Understand these instructions.  Will watch your condition.  Will get help right away if you are not doing well or get worse.   This information is not intended to replace advice given to you by your health care provider. Make sure you discuss any questions you have with your health care provider.   Document Released: 03/26/2001 Document Revised: 02/14/2015 Document Reviewed: 01/05/2014 Elsevier Interactive Patient Education [employer].
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
## 2016-01-03 NOTE — ED Notes (Signed)
Patient here with complaints of cough x1 week. Nyquil with no relief. Nausea, vomiting. Chest wall pain due to coughing.

## 2016-01-15 ENCOUNTER — Ambulatory Visit: Payer: Self-pay | Admitting: Internal Medicine

## 2016-06-03 ENCOUNTER — Ambulatory Visit: Payer: Self-pay | Admitting: Internal Medicine

## 2016-06-10 ENCOUNTER — Encounter: Payer: Self-pay | Admitting: Internal Medicine

## 2016-07-28 ENCOUNTER — Emergency Department (HOSPITAL_COMMUNITY): Payer: Self-pay

## 2016-07-28 ENCOUNTER — Emergency Department (HOSPITAL_COMMUNITY)
Admission: EM | Admit: 2016-07-28 | Discharge: 2016-07-28 | Disposition: A | Discharge status: Home / Self Care | Payer: Self-pay | Attending: Emergency Medicine | Admitting: Emergency Medicine

## 2016-07-28 ENCOUNTER — Encounter (HOSPITAL_COMMUNITY): Payer: Self-pay | Admitting: Emergency Medicine

## 2016-07-28 DIAGNOSIS — F10129 Alcohol abuse with intoxication, unspecified: Secondary | ICD-10-CM | POA: Insufficient documentation

## 2016-07-28 DIAGNOSIS — Z7982 Long term (current) use of aspirin: Secondary | ICD-10-CM | POA: Insufficient documentation

## 2016-07-28 DIAGNOSIS — F10929 Alcohol use, unspecified with intoxication, unspecified: Secondary | ICD-10-CM

## 2016-07-28 DIAGNOSIS — Y999 Unspecified external cause status: Secondary | ICD-10-CM | POA: Insufficient documentation

## 2016-07-28 DIAGNOSIS — F17200 Nicotine dependence, unspecified, uncomplicated: Secondary | ICD-10-CM | POA: Insufficient documentation

## 2016-07-28 DIAGNOSIS — Y939 Activity, unspecified: Secondary | ICD-10-CM | POA: Insufficient documentation

## 2016-07-28 DIAGNOSIS — S0990XA Unspecified injury of head, initial encounter: Secondary | ICD-10-CM | POA: Insufficient documentation

## 2016-07-28 DIAGNOSIS — Y9289 Other specified places as the place of occurrence of the external cause: Secondary | ICD-10-CM | POA: Insufficient documentation

## 2016-07-28 MED ORDER — ACETAMINOPHEN 500 MG PO TABS
1000.0000 mg | ORAL_TABLET | Freq: Once | ORAL | Status: AC
  Administered 2016-07-28: 1000 mg via ORAL
  Filled 2016-07-28: qty 2

## 2016-07-28 MED ORDER — METHOCARBAMOL 500 MG PO TABS
1000.0000 mg | ORAL_TABLET | Freq: Once | ORAL | Status: AC
  Administered 2016-07-28: 1000 mg via ORAL
  Filled 2016-07-28: qty 2

## 2016-07-28 MED ORDER — KETOROLAC TROMETHAMINE 60 MG/2ML IM SOLN
60.0000 mg | Freq: Once | INTRAMUSCULAR | Status: AC
  Administered 2016-07-28: 60 mg via INTRAMUSCULAR
  Filled 2016-07-28: qty 2

## 2016-07-28 MED ORDER — IBUPROFEN 600 MG PO TABS: 600.0000 mg | ORAL_TABLET | Freq: Four times a day (QID) | ORAL | 0 refills | Status: DC | PRN

## 2016-07-28 MED ORDER — METHOCARBAMOL 500 MG PO TABS: 1000.0000 mg | ORAL_TABLET | Freq: Three times a day (TID) | ORAL | 0 refills | Status: DC | PRN

## 2016-07-28 MED ORDER — SODIUM CHLORIDE 0.9 % IV BOLUS (SEPSIS): 1000.0000 mL | Freq: Once | INTRAVENOUS | Status: DC

## 2016-07-28 NOTE — ED Notes (Signed)
MD at bedside. 
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
## 2016-10-09 ENCOUNTER — Emergency Department (HOSPITAL_COMMUNITY): Payer: Self-pay

## 2016-10-09 ENCOUNTER — Encounter (HOSPITAL_COMMUNITY): Payer: Self-pay | Admitting: Emergency Medicine

## 2016-10-09 ENCOUNTER — Emergency Department (HOSPITAL_COMMUNITY)
Admission: EM | Admit: 2016-10-09 | Discharge: 2016-10-09 | Disposition: A | Discharge status: Home / Self Care | Payer: Self-pay | Attending: Emergency Medicine | Admitting: Emergency Medicine

## 2016-10-09 DIAGNOSIS — Z79899 Other long term (current) drug therapy: Secondary | ICD-10-CM | POA: Insufficient documentation

## 2016-10-09 DIAGNOSIS — Z7982 Long term (current) use of aspirin: Secondary | ICD-10-CM | POA: Insufficient documentation

## 2016-10-09 DIAGNOSIS — R0789 Other chest pain: Secondary | ICD-10-CM | POA: Insufficient documentation

## 2016-10-09 DIAGNOSIS — F17200 Nicotine dependence, unspecified, uncomplicated: Secondary | ICD-10-CM | POA: Insufficient documentation

## 2016-10-09 DIAGNOSIS — J069 Acute upper respiratory infection, unspecified: Secondary | ICD-10-CM | POA: Insufficient documentation

## 2016-10-09 LAB — I-STAT BETA HCG BLOOD, ED (MC, WL, AP ONLY): I-stat hCG, quantitative: <5 m[IU]/mL (ref <5)

## 2016-10-09 LAB — CBC
HCT: 39.1 % (ref 36.0–46.0)
HEMOGLOBIN: 14.4 g/dL (ref 12.0–15.0)
MCH: 32.4 pg (ref 26.0–34.0)
MCHC: 36.8 g/dL — AB (ref 30.0–36.0)
MCV: 87.9 fL (ref 78.0–100.0)
Platelets: 174 10*3/uL (ref 150–400)
RBC: 4.45 MIL/uL (ref 3.87–5.11)
RDW: 13.2 % (ref 11.5–15.5)
WBC: 5.7 10*3/uL (ref 4.0–10.5)

## 2016-10-09 LAB — D-DIMER, QUANTITATIVE (NOT AT ARMC): D DIMER QUANT: 0.35 ug{FEU}/mL (ref 0.00–0.50)

## 2016-10-09 LAB — BASIC METABOLIC PANEL
Anion gap: 10 (ref 5–15)
BUN: 6 mg/dL (ref 6–20)
CHLORIDE: 107 mmol/L (ref 101–111)
CO2: 21 mmol/L — AB (ref 22–32)
CREATININE: 0.69 mg/dL (ref 0.44–1.00)
Calcium: 9.8 mg/dL (ref 8.9–10.3)
GFR calc Af Amer: >60 mL/min (ref >60)
GFR calc non Af Amer: >60 mL/min (ref >60)
Glucose, Bld: 88 mg/dL (ref 65–99)
Potassium: 3.9 mmol/L (ref 3.5–5.1)
SODIUM: 138 mmol/L (ref 135–145)

## 2016-10-09 MED ORDER — ALBUTEROL SULFATE HFA 108 (90 BASE) MCG/ACT IN AERS
2.0000 | INHALATION_SPRAY | Freq: Once | RESPIRATORY_TRACT | Status: AC
  Administered 2016-10-09: 2 via RESPIRATORY_TRACT
  Filled 2016-10-09: qty 6.7

## 2016-10-09 MED ORDER — AZITHROMYCIN 250 MG PO TABS: 250.0000 mg | ORAL_TABLET | Freq: Every day | ORAL | 0 refills | Status: DC

## 2016-10-09 MED ORDER — NAPROXEN 375 MG PO TABS: 375.0000 mg | ORAL_TABLET | Freq: Two times a day (BID) | ORAL | 0 refills | Status: AC | PRN

## 2016-10-09 MED ORDER — HYDROCODONE-ACETAMINOPHEN 5-325 MG PO TABS
2.0000 | ORAL_TABLET | Freq: Once | ORAL | Status: AC
  Administered 2016-10-09: 2 via ORAL
  Filled 2016-10-09: qty 2

## 2016-10-09 MED ORDER — HYDROCODONE-HOMATROPINE 5-1.5 MG/5ML PO SYRP: 5.0000 mL | ORAL_SOLUTION | Freq: Four times a day (QID) | ORAL | 0 refills | Status: DC | PRN

## 2016-10-09 MED ORDER — CYCLOBENZAPRINE HCL 10 MG PO TABS: 10.0000 mg | ORAL_TABLET | Freq: Three times a day (TID) | ORAL | 0 refills | Status: DC | PRN
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
## 2016-10-09 NOTE — ED Triage Notes (Signed)
Pt sts SOB and back pain worse with inspiration x 3 days; pt sts some cough

## 2016-10-10 ENCOUNTER — Ambulatory Visit (INDEPENDENT_AMBULATORY_CARE_PROVIDER_SITE_OTHER): Payer: Self-pay | Admitting: Orthopaedic Surgery

## 2016-10-22 ENCOUNTER — Ambulatory Visit (INDEPENDENT_AMBULATORY_CARE_PROVIDER_SITE_OTHER): Payer: Self-pay

## 2016-10-22 ENCOUNTER — Ambulatory Visit (INDEPENDENT_AMBULATORY_CARE_PROVIDER_SITE_OTHER): Payer: Self-pay | Admitting: Orthopaedic Surgery

## 2016-10-22 ENCOUNTER — Encounter (INDEPENDENT_AMBULATORY_CARE_PROVIDER_SITE_OTHER): Payer: Self-pay | Admitting: Orthopaedic Surgery

## 2016-10-22 DIAGNOSIS — M25562 Pain in left knee: Secondary | ICD-10-CM

## 2016-10-22 DIAGNOSIS — G8929 Other chronic pain: Secondary | ICD-10-CM

## 2016-10-22 MED ORDER — METHYLPREDNISOLONE ACETATE 40 MG/ML IJ SUSP
40.0000 mg | INTRAMUSCULAR | Status: AC | PRN
  Administered 2016-10-22: 40 mg via INTRA_ARTICULAR

## 2016-10-22 MED ORDER — LIDOCAINE HCL 1 % IJ SOLN
3.0000 mL | INTRAMUSCULAR | Status: AC | PRN
  Administered 2016-10-22: 3 mL
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
## 2016-11-18 ENCOUNTER — Ambulatory Visit (INDEPENDENT_AMBULATORY_CARE_PROVIDER_SITE_OTHER): Payer: Self-pay | Admitting: Orthopaedic Surgery

## 2016-11-18 ENCOUNTER — Encounter (INDEPENDENT_AMBULATORY_CARE_PROVIDER_SITE_OTHER): Payer: Self-pay | Admitting: Orthopaedic Surgery

## 2016-11-18 DIAGNOSIS — M25562 Pain in left knee: Secondary | ICD-10-CM

## 2016-11-18 NOTE — Progress Notes (Signed)
Patient is continuing to follow-up for her left knee. She has had pain in that knee with locking catching and is mainly mechanical. It hurts with activities. It does not hurt at rest. On my original exam I feel that she potentially a meniscal tear and I did provide an intra-articular steroid injection. She said the steroid injection helped ease her pain but she still having the same mechanical symptoms of locking catching with that knee. We'll obtain x-rays are showed no significant arthritic changes.  On examination of her left knee today she has medial joint line tenderness and she still has a positive Murray sign to the medial side. I can get her fully extendable when I go to flex position past 90 with rotation of the femur on the tibia hurts her significantly in the knee does lock actively.  Given my physical exam of her knee and her continued complaints combined with the failure conservative treatment and MRI is warranted to rule out a meniscal tear. She'll continue to limit activities with that left knee while we await the MRI results. I showed her a knee model and explained the reasoning behind the MRI and what were looking for this type of exam.

## 2016-11-18 NOTE — Addendum Note (Signed)
Addended by: Precious Bard on: 11/18/2016 03:25 PM   Modules accepted: Orders

## 2016-12-02 ENCOUNTER — Ambulatory Visit (INDEPENDENT_AMBULATORY_CARE_PROVIDER_SITE_OTHER): Payer: Self-pay | Admitting: Orthopaedic Surgery

## 2017-01-02 ENCOUNTER — Encounter (INDEPENDENT_AMBULATORY_CARE_PROVIDER_SITE_OTHER): Payer: Self-pay | Admitting: Physician Assistant

## 2017-01-02 ENCOUNTER — Other Ambulatory Visit (HOSPITAL_COMMUNITY)
Admission: RE | Admit: 2017-01-02 | Discharge: 2017-01-02 | Disposition: A | Discharge status: Home / Self Care | Payer: Self-pay | Source: Ambulatory Visit | Attending: Physician Assistant | Admitting: Physician Assistant

## 2017-01-02 ENCOUNTER — Ambulatory Visit (INDEPENDENT_AMBULATORY_CARE_PROVIDER_SITE_OTHER): Payer: Self-pay | Admitting: Physician Assistant

## 2017-01-02 VITALS — BP 142/96 | HR 72 | Temp 97.5°F | Ht 64.0 in | Wt 196.2 lb

## 2017-01-02 DIAGNOSIS — B372 Candidiasis of skin and nail: Secondary | ICD-10-CM

## 2017-01-02 DIAGNOSIS — Z113 Encounter for screening for infections with a predominantly sexual mode of transmission: Secondary | ICD-10-CM

## 2017-01-02 DIAGNOSIS — F418 Other specified anxiety disorders: Secondary | ICD-10-CM

## 2017-01-02 DIAGNOSIS — R102 Pelvic and perineal pain: Secondary | ICD-10-CM

## 2017-01-02 DIAGNOSIS — F1020 Alcohol dependence, uncomplicated: Secondary | ICD-10-CM

## 2017-01-02 LAB — POCT URINALYSIS DIPSTICK
Bilirubin, UA: NEGATIVE
GLUCOSE UA: NEGATIVE
KETONES UA: NEGATIVE
LEUKOCYTES UA: NEGATIVE
Nitrite, UA: NEGATIVE
Protein, UA: NEGATIVE
SPEC GRAV UA: 1.01 (ref 1.030–1.035)
Urobilinogen, UA: 0.2 (ref ?–2.0)
pH, UA: 5.5 (ref 5.0–8.0)

## 2017-01-02 MED ORDER — IBUPROFEN 600 MG PO TABS: 600.0000 mg | ORAL_TABLET | Freq: Three times a day (TID) | ORAL | 0 refills | Status: DC | PRN

## 2017-01-02 MED ORDER — SERTRALINE HCL 50 MG PO TABS: 50.0000 mg | ORAL_TABLET | Freq: Every day | ORAL | 3 refills | Status: DC

## 2017-01-02 MED ORDER — CLOTRIMAZOLE 1 % EX CREA: 1.0000 "application " | TOPICAL_CREAM | Freq: Two times a day (BID) | CUTANEOUS | 0 refills | Status: DC

## 2017-01-02 NOTE — Patient Instructions (Addendum)
Pelvic Pain, Female Pelvic pain is pain in your lower belly (abdomen), below your belly button and between your hips. The pain may start suddenly (acute), keep coming back (recurring), or last a long time (chronic). Pelvic pain that lasts longer than six months is considered chronic. There are many causes of pelvic pain. Sometimes the cause of your pelvic pain is not known. Follow these instructions at home:  Take over-the-counter and prescription medicines only as told by your doctor.  Rest as told by your doctor.  Do not have sex it if hurts.  Keep a journal of your pelvic pain. Write down:  When the pain started.  Where the pain is located.  What seems to make the pain better or worse, such as food or your menstrual cycle.  Any symptoms you have along with the pain.  Keep all follow-up visits as told by your doctor. This is important. Contact a doctor if:  Medicine does not help your pain.  Your pain comes back.  You have new symptoms.  You have unusual vaginal discharge or bleeding.  You have a fever or chills.  You are having a hard time pooping (constipation).  You have blood in your pee (urine) or poop (stool).  Your pee smells bad.  You feel weak or lightheaded. Get help right away if:  You have sudden pain that is very bad.  Your pain continues to get worse.  You have very bad pain and also have any of the following symptoms:  A fever.  Feeling stick to your stomach (nausea).  Throwing up (vomiting).  Being very sweaty.  You pass out (lose consciousness). This information is not intended to replace advice given to you by your health care provider. Make sure you discuss any questions you have with your health care provider. Document Released: 03/18/2008 Document Revised: 10/25/2015 Document Reviewed: 07/21/2015 Elsevier Interactive Patient Education  2017 [place].  Generalized Anxiety Disorder, Adult Generalized anxiety disorder (GAD) is a  mental health disorder. People with this condition constantly worry about everyday events. Unlike normal anxiety, worry related to GAD is not triggered by a specific event. These worries also do not fade or get better with time. GAD interferes with life functions, including relationships, work, and school. GAD can vary from mild to severe. People with severe GAD can have intense waves of anxiety with physical symptoms (panic attacks). What are the causes? The exact cause of GAD is not known. What increases the risk? This condition is more likely to develop in:  Women.  People who have a family history of anxiety disorders.  People who are very shy.  People who experience very stressful life events, such as the death of a loved one.  People who have a very stressful family environment. What are the signs or symptoms? People with GAD often worry excessively about many things in their lives, such as their health and family. They may also be overly concerned about:  Doing well at work.  Being on time.  Natural disasters.  Friendships. Physical symptoms of GAD include:  Fatigue.  Muscle tension or having muscle twitches.  Trembling or feeling shaky.  Being easily startled.  Feeling like your heart is pounding or racing.  Feeling out of breath or like you cannot take a deep breath.  Having trouble falling asleep or staying asleep.  Sweating.  Nausea, diarrhea, or irritable bowel syndrome (IBS).  Headaches.  Trouble concentrating or remembering facts.  Restlessness.  Irritability. How is this diagnosed?  Your health care provider can diagnose GAD based on your symptoms and medical history. You will also have a physical exam. The health care provider will ask specific questions about your symptoms, including how severe they are, when they started, and if they come and go. Your health care provider may ask you about your use of alcohol or drugs, including prescription  medicines. Your health care provider may refer you to a mental health specialist for further evaluation. Your health care provider will do a thorough examination and may perform additional tests to rule out other possible causes of your symptoms. To be diagnosed with GAD, a person must have anxiety that:  Is out of his or her control.  Affects several different aspects of his or her life, such as work and relationships.  Causes distress that makes him or her unable to take part in normal activities.  Includes at least three physical symptoms of GAD, such as restlessness, fatigue, trouble concentrating, irritability, muscle tension, or sleep problems. Before your health care provider can confirm a diagnosis of GAD, these symptoms must be present more days than they are not, and they must last for six months or longer. How is this treated? The following therapies are usually used to treat GAD:  Medicine. Antidepressant medicine is usually prescribed for long-term daily control. Antianxiety medicines may be added in severe cases, especially when panic attacks occur.  Talk therapy (psychotherapy). Certain types of talk therapy can be helpful in treating GAD by providing support, education, and guidance. Options include:  Cognitive behavioral therapy (CBT). People learn coping skills and techniques to ease their anxiety. They learn to identify unrealistic or negative thoughts and behaviors and to replace them with positive ones.  Acceptance and commitment therapy (ACT). This treatment teaches people how to be mindful as a way to cope with unwanted thoughts and feelings.  Biofeedback. This process trains you to manage your body's response (physiological response) through breathing techniques and relaxation methods. You will work with a therapist while machines are used to monitor your physical symptoms.  Stress management techniques. These include yoga, meditation, and exercise. A mental health  specialist can help determine which treatment is best for you. Some people see improvement with one type of therapy. However, other people require a combination of therapies. Follow these instructions at home:  Take over-the-counter and prescription medicines only as told by your health care provider.  Try to maintain a normal routine.  Try to anticipate stressful situations and allow extra time to manage them.  Practice any stress management or self-calming techniques as taught by your health care provider.  Do not punish yourself for setbacks or for not making progress.  Try to recognize your accomplishments, even if they are small.  Keep all follow-up visits as told by your health care provider. This is important. Contact a health care provider if:  Your symptoms do not get better.  Your symptoms get worse.  You have signs of depression, such as:  A persistently sad, cranky, or irritable mood.  Loss of enjoyment in activities that used to bring you joy.  Change in weight or eating.  Changes in sleeping habits.  Avoiding friends or family members.  Loss of energy for normal tasks.  Feelings of guilt or worthlessness. Get help right away if:  You have serious thoughts about hurting yourself or others. If you ever feel like you may hurt yourself or others, or have thoughts about taking your own life, get help right away.  You can go to your nearest emergency department or call:  Your local emergency services (911 in the [place]  A suicide crisis helpline, such as the [place] at 380-231-4523. This is open 24 hours a day. Summary  Generalized anxiety disorder (GAD) is a mental health disorder that involves worry that is not triggered by a specific event.  People with GAD often worry excessively about many things in their lives, such as their health and family.  GAD may cause physical symptoms such as restlessness, trouble concentrating,  sleep problems, frequent sweating, nausea, diarrhea, headaches, and trembling or muscle twitching.  A mental health specialist can help determine which treatment is best for you. Some people see improvement with one type of therapy. However, other people require a combination of therapies. This information is not intended to replace advice given to you by your health care provider. Make sure you discuss any questions you have with your health care provider. Document Released: 01/25/2013 Document Revised: 08/20/2016 Document Reviewed: 08/20/2016 Elsevier Interactive Patient Education  2017 [place].  Major Depressive Disorder, Adult Major depressive disorder (MDD) is a mental health condition. It may also be called clinical depression or unipolar depression. MDD usually causes feelings of sadness, hopelessness, or helplessness. MDD can also cause physical symptoms. It can interfere with work, school, relationships, and other everyday activities. MDD may be mild, moderate, or severe. It may occur once (single episode major depressive disorder) or it may occur multiple times (recurrent major depressive disorder). What are the causes? The exact cause of this condition is not known. MDD is most likely caused by a combination of things, which may include:  Genetic factors. These are traits that are passed along from parent to child.  Individual factors. Your personality, your behavior, and the way you handle your thoughts and feelings may contribute to MDD. This includes personality traits and behaviors learned from others.  Physical factors, such as:  Differences in the part of your brain that controls emotion. This part of your brain may be different than it is in people who do not have MDD.  Long-term (chronic) medical or psychiatric illnesses.  Social factors. Traumatic experiences or major life changes may play a role in the development of MDD. What increases the risk? This condition is  more likely to develop in women. The following factors may also make you more likely to develop MDD:  A family history of depression.  Troubled family relationships.  Abnormally low levels of certain brain chemicals.  Traumatic events in childhood, especially abuse or the loss of a parent.  Being under a lot of stress, or long-term stress, especially from upsetting life experiences or losses.  A history of:  Chronic physical illness.  Other mental health disorders.  Substance abuse.  Poor living conditions.  Experiencing social exclusion or discrimination on a regular basis. What are the signs or symptoms? The main symptoms of MDD typically include:  Constant depressed or irritable mood.  Loss of interest in things and activities. MDD symptoms may also include:  Sleeping or eating too much or too little.  Unexplained weight change.  Fatigue or low energy.  Feelings of worthlessness or guilt.  Difficulty thinking clearly or making decisions.  Thoughts of suicide or of harming others.  Physical agitation or weakness.  Isolation. Severe cases of MDD may also occur with other symptoms, such as:  Delusions or hallucinations, in which you imagine things that are not real (psychotic depression).  Low-level depression that lasts  at least a year (chronic depression or persistent depressive disorder).  Extreme sadness and hopelessness (melancholic depression).  Trouble speaking and moving (catatonic depression). How is this diagnosed? This condition may be diagnosed based on:  Your symptoms.  Your medical history, including your mental health history. This may involve tests to evaluate your mental health. You may be asked questions about your lifestyle, including any drug and alcohol use, and how long you have had symptoms of MDD.  A physical exam.  Blood tests to rule out other conditions. You must have a depressed mood and at least four other MDD symptoms most  of the day, nearly every day in the same 2-week timeframe before your health care provider can confirm a diagnosis of MDD. How is this treated? This condition is usually treated by mental health professionals, such as psychologists, psychiatrists, and clinical social workers. You may need more than one type of treatment. Treatment may include:  Psychotherapy. This is also called talk therapy or counseling. Types of psychotherapy include:  Cognitive behavioral therapy (CBT). This type of therapy teaches you to recognize unhealthy feelings, thoughts, and behaviors, and replace them with positive thoughts and actions.  Interpersonal therapy (IPT). This helps you to improve the way you relate to and communicate with others.  Family therapy. This treatment includes members of your family.  Medicine to treat anxiety and depression, or to help you control certain emotions and behaviors.  Lifestyle changes, such as:  Limiting alcohol and drug use.  Exercising regularly.  Getting plenty of sleep.  Making healthy eating choices.  Spending more time outdoors. Treatments involving stimulation of the brain can be used in situations with extremely severe symptoms, or when medicine or other therapies do not work over time. These treatments include electroconvulsive therapy, transcranial magnetic stimulation, and vagal nerve stimulation. Follow these instructions at home: Activity   Return to your normal activities as told by your health care provider.  Exercise regularly and spend time outdoors as told by your health care provider. General instructions   Take over-the-counter and prescription medicines only as told by your health care provider.  Do not drink alcohol. If you drink alcohol, limit your alcohol intake to no more than 1 drink a day for nonpregnant women and 2 drinks a day for men. One drink equals 12 oz of beer, 5 oz of wine, or 1 oz of hard liquor. Alcohol can affect any  antidepressant medicines you are taking. Talk to your health care provider about your alcohol use.  Eat a healthy diet and get plenty of sleep.  Find activities that you enjoy doing, and make time to do them.  Consider joining a support group. Your health care provider may be able to recommend a support group.  Keep all follow-up visits as told by your health care provider. This is important. Where to find more information: [employer] on Mental Illness  www.nami.org [place] National Institute of Mental Health  https://carter.com/ National Suicide Prevention Lifeline  1-800-273-TALK (607) 712-4905). This is free, 24-hour help. Contact a health care provider if:  Your symptoms get worse.  You develop new symptoms. Get help right away if:  You self-harm.  You have serious thoughts about hurting yourself or others.  You see, hear, taste, smell, or feel things that are not present (hallucinate). This information is not intended to replace advice given to you by your health care provider. Make sure you discuss any questions you have with your health care provider. Document Released: 01/25/2013 Document  Revised: 06/06/2016 Document Reviewed: 04/10/2016 Elsevier Interactive Patient Education  2017 [employer].

## 2017-01-02 NOTE — Progress Notes (Signed)
Subjective:  Patient ID: Janet Ramos, female    DOB: 07/28/1961  Age: 56 y.o. MRN: [id number]  CC: pelvic pain  HPI Janet Ramos is a 56 y.o. female with a PMH of alcoholism and trichomoniasis presents with right sided pelvic pain since 3 weeks ago. Complain of inguinal itching and redness. Described as sharp and occurring occasionally. Has a thin white vaginal discharge.  Has not been sexually active for three years. Denies genital lesions, f/c/n/v, abdominal pain, rash, bleeding, chest pain, SOB, HA, dysuria, or GI sxs.   Outpatient Medications Prior to Visit  Medication Sig Dispense Refill  . Multiple Vitamin (MULTIVITAMIN WITH MINERALS) TABS tablet Take 1 tablet by mouth daily. 30 tablet 0  . Multiple Vitamins-Minerals (CENTRUM SILVER PO) Take 1 tablet by mouth daily.    Marland Kitchen HYDROcodone-homatropine (HYCODAN) 5-1.5 MG/5ML syrup Take 5 mLs by mouth every 6 (six) hours as needed for cough. 80 mL 0  . aspirin EC 325 MG EC tablet Take 1 tablet (325 mg total) by mouth daily. (Patient not taking: Reported on 01/02/2017) 30 tablet 0  . Phenyleph-Doxylamine-DM-APAP (NYQUIL SEVERE COLD/FLU PO) Take 1 tablet by mouth once.    Marland Kitchen azithromycin (ZITHROMAX) 250 MG tablet Take 1 tablet (250 mg total) by mouth daily. Take first 2 tablets together, then 1 every day until finished. (Patient not taking: Reported on 01/02/2017) 6 tablet 0  . cyclobenzaprine (FLEXERIL) 10 MG tablet Take 1 tablet (10 mg total) by mouth 3 (three) times daily as needed for muscle spasms. (Patient not taking: Reported on 01/02/2017) 20 tablet 0  . ibuprofen (ADVIL,MOTRIN) 600 MG tablet Take 1 tablet (600 mg total) by mouth every 6 (six) hours as needed. (Patient not taking: Reported on 01/02/2017) 30 tablet 0  . methocarbamol (ROBAXIN) 500 MG tablet Take 2 tablets (1,000 mg total) by mouth every 8 (eight) hours as needed for muscle spasms. (Patient not taking: Reported on 10/22/2016) 30 tablet 0  . promethazine-codeine (PHENERGAN WITH  CODEINE) 6.25-10 MG/5ML syrup Take 10 mLs by mouth every 4 (four) hours as needed for cough. (Patient not taking: Reported on 10/22/2016) 120 mL 0  . thiamine 100 MG tablet Take 1 tablet (100 mg total) by mouth daily. (Patient not taking: Reported on 10/22/2016) 30 tablet 0   No facility-administered medications prior to visit.      ROS Review of Systems  Constitutional: Negative for chills, fever and malaise/fatigue.  Eyes: Negative for blurred vision.  Respiratory: Negative for shortness of breath.   Cardiovascular: Negative for chest pain and palpitations.  Gastrointestinal: Negative for abdominal pain and nausea.  Genitourinary: Negative for dysuria, flank pain, frequency, hematuria and urgency.  Musculoskeletal: Negative for joint pain and myalgias.  Skin: Negative for rash.  Neurological: Negative for tingling and headaches.  Psychiatric/Behavioral: Positive for depression. The patient is nervous/anxious.     Objective:  BP (!) 142/96 (BP Location: Left Arm, Patient Position: Sitting, Cuff Size: Normal)   Pulse 72   Temp 97.5 F (36.4 C) (Oral)   Ht 5\' 4"  (1.626 m)   Wt 196 lb 3.2 oz (89 kg)   LMP 03/15/2011   SpO2 98%   BMI 33.68 kg/m   BP/Weight 01/02/2017 10/09/2016 54/00/8676  Systolic BP 195 093 267  Diastolic BP 96 91 59  Wt. (Lbs) 196.2 - 180  BMI 33.68 - 30.9      Physical Exam  Constitutional: She is oriented to person, place, and time.  Well developed, overweight, NAD, polite  HENT:  Head: Normocephalic and atraumatic.  Eyes: No scleral icterus.  Neck: Normal range of motion. Neck supple. No thyromegaly present.  Cardiovascular: Normal rate, regular rhythm and normal heart sounds.   Pulmonary/Chest: Effort normal and breath sounds normal.  Abdominal: Soft. Bowel sounds are normal. There is no tenderness.  Genitourinary:  Genitourinary Comments: Scant thick white discharge. Some yellowish discharge at cervical os. No cervicitis, no cervical motion  tenderness. Right adnexal pain, patient moved my hand upon feeling pain. No left adnexal or uterine pain.  Musculoskeletal: She exhibits no edema.  Neurological: She is alert and oriented to person, place, and time.  Skin: Skin is warm and dry. No rash noted. No erythema. No pallor.  Psychiatric: She has a normal mood and affect. Her behavior is normal. Thought content normal.  Vitals reviewed.    Assessment & Plan:   1. Pelvic pain in female - US Pelvis Limited; Future - US OB Transvaginal; Future - ibuprofen (ADVIL,MOTRIN) 600 MG tablet; Take 1 tablet (600 mg total) by mouth every 8 (eight) hours as needed.  Dispense: 30 tablet; Refill: 0  2. Screening examination for STD (sexually transmitted disease) - HIV antibody - RPR - Urine cytology ancillary only - Urinalysis Dipstick  3. Candidal intertrigo - clotrimazole (RA CLOTRIMAZOLE) 1 % cream; Apply 1 application topically 2 (two) times daily.  Dispense: 30 g; Refill: 0  4. Depression with anxiety - PHQ9 score 19 - GAD7 score 14 - sertraline (ZOLOFT) 50 MG tablet; Take 1 tablet (50 mg total) by mouth daily.  Dispense: 30 tablet; Refill: 3 - Ambulatory referral to Psychology - CBC With Differential - Comprehensive metabolic panel - TSH  5. Alcoholism ([place]) - Drug Screen 8 w/Conf, WB - CBC With Differential - Comprehensive metabolic panel - TSH   Meds ordered this encounter  Medications  . sertraline (ZOLOFT) 50 MG tablet    Sig: Take 1 tablet (50 mg total) by mouth daily.    Dispense:  30 tablet    Refill:  3    Order Specific Question:   Supervising Provider    Answer:   Tresa Garter [id number]  . clotrimazole (RA CLOTRIMAZOLE) 1 % cream    Sig: Apply 1 application topically 2 (two) times daily.    Dispense:  30 g    Refill:  0    Order Specific Question:   Supervising Provider    Answer:   Tresa Garter [id number]  . ibuprofen (ADVIL,MOTRIN) 600 MG tablet    Sig: Take 1 tablet (600 mg total) by  mouth every 8 (eight) hours as needed.    Dispense:  30 tablet    Refill:  0    Order Specific Question:   Supervising Provider    Answer:   Tresa Garter [[id number]]    Follow-up: Return in about 4 weeks (around 01/30/2017) for Depression with anxiety. Pelvic pain.Clent Demark PA

## 2017-01-03 LAB — URINE CYTOLOGY ANCILLARY ONLY
Chlamydia: NEGATIVE
Neisseria Gonorrhea: NEGATIVE
TRICH (WINDOWPATH): POSITIVE — AB

## 2017-01-03 LAB — HIV ANTIBODY (ROUTINE TESTING W REFLEX): HIV Screen 4th Generation wRfx: NONREACTIVE

## 2017-01-03 LAB — RPR: RPR: NONREACTIVE

## 2017-01-07 LAB — URINE CYTOLOGY ANCILLARY ONLY
BACTERIAL VAGINITIS: POSITIVE — AB
CANDIDA VAGINITIS: NEGATIVE

## 2017-01-11 ENCOUNTER — Other Ambulatory Visit (INDEPENDENT_AMBULATORY_CARE_PROVIDER_SITE_OTHER): Payer: Self-pay | Admitting: Physician Assistant

## 2017-01-11 DIAGNOSIS — N760 Acute vaginitis: Principal | ICD-10-CM

## 2017-01-11 DIAGNOSIS — B9689 Other specified bacterial agents as the cause of diseases classified elsewhere: Secondary | ICD-10-CM

## 2017-01-11 MED ORDER — METRONIDAZOLE 500 MG PO TABS: 500.0000 mg | ORAL_TABLET | Freq: Two times a day (BID) | ORAL | 0 refills | Status: AC

## 2017-01-11 NOTE — Progress Notes (Signed)
Trichomonas found on urine cytology. I called and left message for patient telling her to pick up metronidazole at her pharmacy for trichomoniasis.

## 2017-01-24 ENCOUNTER — Ambulatory Visit (INDEPENDENT_AMBULATORY_CARE_PROVIDER_SITE_OTHER): Payer: Self-pay

## 2017-02-11 ENCOUNTER — Telehealth (INDEPENDENT_AMBULATORY_CARE_PROVIDER_SITE_OTHER): Payer: Self-pay | Admitting: Physician Assistant

## 2017-02-11 NOTE — Telephone Encounter (Signed)
Patient called stated Janet Fail PA called her left her voicemail to call back regarding the infection that she had.  Patient also stated not able to get medication.  Pharmacy told her no medication was sent in for her.  Please follow up with patient

## 2017-02-14 ENCOUNTER — Ambulatory Visit (INDEPENDENT_AMBULATORY_CARE_PROVIDER_SITE_OTHER): Payer: Self-pay

## 2017-02-14 ENCOUNTER — Telehealth (INDEPENDENT_AMBULATORY_CARE_PROVIDER_SITE_OTHER): Payer: Self-pay | Admitting: Physician Assistant

## 2017-02-14 ENCOUNTER — Other Ambulatory Visit (INDEPENDENT_AMBULATORY_CARE_PROVIDER_SITE_OTHER): Payer: Self-pay | Admitting: Physician Assistant

## 2017-02-14 DIAGNOSIS — A599 Trichomoniasis, unspecified: Secondary | ICD-10-CM

## 2017-02-14 MED ORDER — METRONIDAZOLE 500 MG PO TABS: 500.0000 mg | ORAL_TABLET | Freq: Two times a day (BID) | ORAL | 0 refills | Status: AC

## 2017-02-14 NOTE — Telephone Encounter (Signed)
FWD to PCP. Tempestt S Roberts, CMA  

## 2017-02-14 NOTE — Progress Notes (Signed)
Order placed for metronidazole.

## 2017-02-14 NOTE — Telephone Encounter (Signed)
Patient notified to pick up Rx from [facility] [address]. Nat Christen, CMA

## 2017-02-14 NOTE — Telephone Encounter (Signed)
Tell pt I have put in another order for metronidazole. Please pick up at [facility].

## 2017-02-14 NOTE — Telephone Encounter (Signed)
Per patient would like all RX forwarded to [facility] stated it is to expensive at [facility] and [place].  Please follow up with patient.

## 2017-02-17 ENCOUNTER — Other Ambulatory Visit (INDEPENDENT_AMBULATORY_CARE_PROVIDER_SITE_OTHER): Payer: Self-pay | Admitting: Physician Assistant

## 2017-02-17 DIAGNOSIS — F418 Other specified anxiety disorders: Secondary | ICD-10-CM

## 2017-02-17 DIAGNOSIS — R102 Pelvic and perineal pain: Secondary | ICD-10-CM

## 2017-02-17 DIAGNOSIS — B372 Candidiasis of skin and nail: Secondary | ICD-10-CM

## 2017-02-17 DIAGNOSIS — Z760 Encounter for issue of repeat prescription: Secondary | ICD-10-CM

## 2017-02-17 MED ORDER — SERTRALINE HCL 50 MG PO TABS
50.0000 mg | ORAL_TABLET | Freq: Every day | ORAL | 3 refills | Status: DC
Start: 2017-02-17 — End: 2017-10-31

## 2017-02-17 MED ORDER — CLOTRIMAZOLE 1 % EX CREA: 1.0000 "application " | TOPICAL_CREAM | Freq: Two times a day (BID) | CUTANEOUS | 0 refills | Status: DC

## 2017-02-17 MED ORDER — IBUPROFEN 600 MG PO TABS: 600.0000 mg | ORAL_TABLET | Freq: Three times a day (TID) | ORAL | 0 refills | Status: DC | PRN

## 2017-02-17 NOTE — Telephone Encounter (Signed)
FWD to PCP. Rik Wadel S Saanvika Vazques, CMA  

## 2017-02-17 NOTE — Progress Notes (Signed)
Refills

## 2017-02-17 NOTE — Telephone Encounter (Signed)
I have sent the meds from last visit to [facility]. Please notify patient.

## 2017-02-18 MED FILL — ?SERTRALINE HCL 50 MG TABLE: 50 | 30 days supply | Qty: 30 | Fill #0

## 2017-02-18 MED FILL — IBUPROFEN 600 MG TABLET: 600 | 10 days supply | Qty: 30 | Fill #0

## 2017-02-18 NOTE — Telephone Encounter (Signed)
Patient notified. Nat Christen, CMA

## 2017-02-26 ENCOUNTER — Telehealth (INDEPENDENT_AMBULATORY_CARE_PROVIDER_SITE_OTHER): Payer: Self-pay | Admitting: Physician Assistant

## 2017-02-26 NOTE — Telephone Encounter (Signed)
PATIENT INFORMED THAT ON 5/4 SHE WAS TOLD TO PICK UP RX AT [facility]. Nat Christen, CMA

## 2017-02-26 NOTE — Telephone Encounter (Signed)
Patient called stated missing 1 prescription does not know the name but [facility] said they have not received anything and they do not see any other RX on file for her.  Patient stated it is for infection but does not know where she was supposed to pick up RX Please follow up with patient

## 2017-03-05 ENCOUNTER — Telehealth (INDEPENDENT_AMBULATORY_CARE_PROVIDER_SITE_OTHER): Payer: Self-pay | Admitting: Physician Assistant

## 2017-03-05 NOTE — Telephone Encounter (Signed)
Patient notified again that Rx was sent to [facility] [address]. Nat Christen, CMA

## 2017-03-05 NOTE — Telephone Encounter (Signed)
Patient called stated wanted to know if we can sent RX to [facility] pharm.   Stated no one has called her back.  Informed patient CMA called her back on 02/26/2017 and informed RX was sent to [facility].  Per patient needs to know which [facility].  Please call her back.

## 2017-03-20 ENCOUNTER — Ambulatory Visit (HOSPITAL_COMMUNITY): Payer: Self-pay | Admitting: Clinical

## 2017-04-21 ENCOUNTER — Encounter (HOSPITAL_COMMUNITY): Payer: Self-pay | Admitting: Clinical

## 2017-04-21 ENCOUNTER — Ambulatory Visit (INDEPENDENT_AMBULATORY_CARE_PROVIDER_SITE_OTHER): Payer: No Typology Code available for payment source | Admitting: Clinical

## 2017-04-21 DIAGNOSIS — F3130 Bipolar disorder, current episode depressed, mild or moderate severity, unspecified: Secondary | ICD-10-CM

## 2017-04-21 DIAGNOSIS — F1020 Alcohol dependence, uncomplicated: Secondary | ICD-10-CM

## 2017-04-21 DIAGNOSIS — F4310 Post-traumatic stress disorder, unspecified: Secondary | ICD-10-CM
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
## 2017-05-06 ENCOUNTER — Encounter (HOSPITAL_COMMUNITY): Payer: Self-pay | Admitting: Licensed Clinical Social Worker

## 2017-05-06 ENCOUNTER — Ambulatory Visit (INDEPENDENT_AMBULATORY_CARE_PROVIDER_SITE_OTHER): Payer: No Typology Code available for payment source | Admitting: Licensed Clinical Social Worker

## 2017-05-06 DIAGNOSIS — F4310 Post-traumatic stress disorder, unspecified: Secondary | ICD-10-CM

## 2017-05-06 DIAGNOSIS — F1020 Alcohol dependence, uncomplicated: Secondary | ICD-10-CM
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
## 2017-05-20 ENCOUNTER — Ambulatory Visit (HOSPITAL_COMMUNITY): Payer: No Typology Code available for payment source | Admitting: Licensed Clinical Social Worker

## 2017-06-17 ENCOUNTER — Ambulatory Visit (HOSPITAL_COMMUNITY): Payer: Self-pay | Admitting: Licensed Clinical Social Worker

## 2017-06-30 DIAGNOSIS — Z5321 Procedure and treatment not carried out due to patient leaving prior to being seen by health care provider: Secondary | ICD-10-CM | POA: Insufficient documentation

## 2017-06-30 DIAGNOSIS — M549 Dorsalgia, unspecified: Secondary | ICD-10-CM | POA: Insufficient documentation

## 2017-06-30 DIAGNOSIS — M79606 Pain in leg, unspecified: Secondary | ICD-10-CM | POA: Insufficient documentation

## 2017-07-01 ENCOUNTER — Emergency Department (HOSPITAL_COMMUNITY): Payer: No Typology Code available for payment source

## 2017-07-01 ENCOUNTER — Emergency Department (HOSPITAL_COMMUNITY)
Admission: EM | Admit: 2017-07-01 | Discharge: 2017-07-01 | Disposition: A | Discharge status: Home / Self Care | Payer: No Typology Code available for payment source | Attending: Emergency Medicine | Admitting: Emergency Medicine

## 2017-07-01 ENCOUNTER — Encounter (HOSPITAL_COMMUNITY): Payer: Self-pay | Admitting: *Deleted

## 2017-07-01 MED ORDER — ALBUTEROL SULFATE (2.5 MG/3ML) 0.083% IN NEBU
INHALATION_SOLUTION | RESPIRATORY_TRACT | Status: AC
  Filled 2017-07-01: qty 6

## 2017-07-01 MED ORDER — ALBUTEROL SULFATE (2.5 MG/3ML) 0.083% IN NEBU
5.0000 mg | INHALATION_SOLUTION | Freq: Once | RESPIRATORY_TRACT | Status: AC
  Administered 2017-07-01: 5 mg via RESPIRATORY_TRACT

## 2017-07-01 NOTE — ED Notes (Signed)
Pt walked up to desk to find out about wait time, and was informed wait time. Pt was upset about wait time and stated she was leaving. Pt was encouraged to stay. Pt was seen walking out the lobby.

## 2017-07-01 NOTE — ED Triage Notes (Addendum)
Pt c/o upper L sided back pain with small mount of phlegm. Also reports leg pain and "knots on my neck." Pt has taken ibuprofen and hydrocodone without relief

## 2017-07-11 ENCOUNTER — Ambulatory Visit (HOSPITAL_COMMUNITY): Payer: Self-pay | Admitting: Licensed Clinical Social Worker

## 2017-07-16 ENCOUNTER — Ambulatory Visit (INDEPENDENT_AMBULATORY_CARE_PROVIDER_SITE_OTHER): Payer: Self-pay | Admitting: Orthopaedic Surgery

## 2017-07-18 ENCOUNTER — Emergency Department (HOSPITAL_COMMUNITY)
Admission: EM | Admit: 2017-07-18 | Discharge: 2017-07-19 | Disposition: A | Discharge status: Home / Self Care | Payer: No Typology Code available for payment source | Attending: Emergency Medicine | Admitting: Emergency Medicine

## 2017-07-18 DIAGNOSIS — F17200 Nicotine dependence, unspecified, uncomplicated: Secondary | ICD-10-CM | POA: Insufficient documentation

## 2017-07-18 DIAGNOSIS — G839 Paralytic syndrome, unspecified: Secondary | ICD-10-CM | POA: Insufficient documentation

## 2017-07-18 DIAGNOSIS — Z79899 Other long term (current) drug therapy: Secondary | ICD-10-CM | POA: Insufficient documentation

## 2017-07-18 DIAGNOSIS — F10920 Alcohol use, unspecified with intoxication, uncomplicated: Secondary | ICD-10-CM

## 2017-07-18 DIAGNOSIS — R29898 Other symptoms and signs involving the musculoskeletal system: Secondary | ICD-10-CM

## 2017-07-18 DIAGNOSIS — F10929 Alcohol use, unspecified with intoxication, unspecified: Secondary | ICD-10-CM | POA: Insufficient documentation

## 2017-07-18 DIAGNOSIS — Z7982 Long term (current) use of aspirin: Secondary | ICD-10-CM | POA: Insufficient documentation

## 2017-07-18 DIAGNOSIS — R4182 Altered mental status, unspecified: Secondary | ICD-10-CM | POA: Insufficient documentation

## 2017-07-19 ENCOUNTER — Emergency Department (HOSPITAL_COMMUNITY): Payer: No Typology Code available for payment source

## 2017-07-19 ENCOUNTER — Encounter (HOSPITAL_COMMUNITY): Payer: Self-pay | Admitting: Emergency Medicine

## 2017-07-19 DIAGNOSIS — R29898 Other symptoms and signs involving the musculoskeletal system: Secondary | ICD-10-CM

## 2017-07-19 DIAGNOSIS — F10929 Alcohol use, unspecified with intoxication, unspecified: Secondary | ICD-10-CM

## 2017-07-19 DIAGNOSIS — R531 Weakness: Secondary | ICD-10-CM

## 2017-07-19 LAB — URINALYSIS, ROUTINE W REFLEX MICROSCOPIC
BILIRUBIN URINE: NEGATIVE
GLUCOSE, UA: NEGATIVE mg/dL
Hgb urine dipstick: NEGATIVE
KETONES UR: NEGATIVE mg/dL
Leukocytes, UA: NEGATIVE
NITRITE: NEGATIVE
PH: 6 (ref 5.0–8.0)
Protein, ur: NEGATIVE mg/dL
Specific Gravity, Urine: 1.005 (ref 1.005–1.030)

## 2017-07-19 LAB — COMPREHENSIVE METABOLIC PANEL
ALBUMIN: 4 g/dL (ref 3.5–5.0)
ALK PHOS: 61 U/L (ref 38–126)
ALT: 39 U/L (ref 14–54)
ANION GAP: 11 (ref 5–15)
AST: 41 U/L (ref 15–41)
BILIRUBIN TOTAL: 0.4 mg/dL (ref 0.3–1.2)
BUN: 5 mg/dL — ABNORMAL LOW (ref 6–20)
CHLORIDE: 107 mmol/L (ref 101–111)
CO2: 23 mmol/L (ref 22–32)
Calcium: 9.1 mg/dL (ref 8.9–10.3)
Creatinine, Ser: 0.84 mg/dL (ref 0.44–1.00)
GFR calc non Af Amer: >60 mL/min (ref >60)
Glucose, Bld: 109 mg/dL — ABNORMAL HIGH (ref 65–99)
POTASSIUM: 3.5 mmol/L (ref 3.5–5.1)
Sodium: 141 mmol/L (ref 135–145)
Total Protein: 7.5 g/dL (ref 6.5–8.1)

## 2017-07-19 LAB — CBC WITH DIFFERENTIAL/PLATELET
BASOS PCT: 0 %
Basophils Absolute: 0 10*3/uL (ref 0.0–0.1)
EOS PCT: 1 %
Eosinophils Absolute: 0.1 10*3/uL (ref 0.0–0.7)
HEMATOCRIT: 38.6 % (ref 36.0–46.0)
Hemoglobin: 13.7 g/dL (ref 12.0–15.0)
LYMPHS PCT: 58 %
Lymphs Abs: 3.2 10*3/uL (ref 0.7–4.0)
MCH: 32.4 pg (ref 26.0–34.0)
MCHC: 35.5 g/dL (ref 30.0–36.0)
MCV: 91.3 fL (ref 78.0–100.0)
Monocytes Absolute: 0.4 10*3/uL (ref 0.1–1.0)
Monocytes Relative: 7 %
NEUTROS ABS: 2 10*3/uL (ref 1.7–7.7)
NEUTROS PCT: 34 %
PLATELETS: 149 10*3/uL — AB (ref 150–400)
RBC: 4.23 MIL/uL (ref 3.87–5.11)
RDW: 13.6 % (ref 11.5–15.5)
WBC: 5.7 10*3/uL (ref 4.0–10.5)

## 2017-07-19 LAB — ETHANOL: ALCOHOL ETHYL (B): 277 mg/dL — AB (ref <10)

## 2017-07-19 LAB — RAPID URINE DRUG SCREEN, HOSP PERFORMED
Amphetamines: NOT DETECTED
BARBITURATES: NOT DETECTED
Benzodiazepines: NOT DETECTED
Cocaine: POSITIVE — AB
Opiates: NOT DETECTED
Tetrahydrocannabinol: NOT DETECTED

## 2017-07-19 LAB — ACETAMINOPHEN LEVEL: Acetaminophen (Tylenol), Serum: <10 ug/mL — ABNORMAL LOW (ref 10–30)

## 2017-07-19 MED ORDER — LORAZEPAM 2 MG/ML IJ SOLN
1.0000 mg | Freq: Once | INTRAMUSCULAR | Status: AC
  Administered 2017-07-19: 1 mg via INTRAVENOUS

## 2017-07-19 MED ORDER — IOPAMIDOL (ISOVUE-370) INJECTION 76%
INTRAVENOUS | Status: AC
  Administered 2017-07-19: 50 mL
  Filled 2017-07-19: qty 50

## 2017-07-19 MED ORDER — SODIUM CHLORIDE 0.9 % IV BOLUS (SEPSIS)
1000.0000 mL | Freq: Once | INTRAVENOUS | Status: AC
  Administered 2017-07-19: 1000 mL via INTRAVENOUS

## 2017-07-19 MED ORDER — LORAZEPAM 2 MG/ML IJ SOLN
INTRAMUSCULAR | Status: AC
  Filled 2017-07-19: qty 1

## 2017-07-19 MED ORDER — AMMONIA AROMATIC IN INHA
RESPIRATORY_TRACT | Status: AC
  Filled 2017-07-19: qty 10

## 2017-07-19 MED ORDER — SODIUM CHLORIDE 0.9 % IV BOLUS (SEPSIS)
500.0000 mL | Freq: Once | INTRAVENOUS | Status: AC
  Administered 2017-07-19: 500 mL via INTRAVENOUS
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
## 2017-07-19 NOTE — ED Notes (Signed)
MD at bedside. 

## 2017-07-19 NOTE — ED Notes (Signed)
To CT at this time.

## 2017-07-19 NOTE — ED Notes (Signed)
Neurology at bedside.

## 2017-07-19 NOTE — ED Notes (Signed)
Returned from MRI, sleeping comfortably at this time. RN did not disturb while reattaching cardiac monitor/BP cuff/pulse ox. SpO2 down to 90% on room air after ativan, placed on 2L [place], SpO2 up to 97%.

## 2017-07-19 NOTE — ED Notes (Addendum)
Pt c/o that MRI is too loud to complete exam. Dr. Stark Jock notified, see new orders.

## 2017-07-19 NOTE — ED Notes (Signed)
Pt is arousable with verbal stimulation. Falls asleep quickly while speaking with RN.

## 2017-07-19 NOTE — ED Notes (Signed)
Pt ambulated with 1 staff standby, states she feels ready to go home. Dr. Ellender Hose notified.
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
## 2017-07-19 NOTE — ED Provider Notes (Signed)
Assumed care from Dr. Stark Jock at 7 Am. Briefly, the patient is a 56 y.o. female with PMHx of  has a past medical history of Alcoholism ([place]) (09/2014) and Trichimoniasis (2010). here with AMS in setting of EtOH and cocaine use. Pt reportedly was c/o unilateral weakness so full stroke work-up completed, MR and CT Angio all neg for acute abnormality. Likely multifactorial and 2/2 substance use. Plan to monitor for sobriety and d/c home once back to baseline.   Labs Reviewed  ACETAMINOPHEN LEVEL - Abnormal; Notable for the following:       Result Value   Acetaminophen (Tylenol), Serum <10 (*)    All other components within normal limits  COMPREHENSIVE METABOLIC PANEL - Abnormal; Notable for the following:    Glucose, Bld 109 (*)    BUN 5 (*)    All other components within normal limits  ETHANOL - Abnormal; Notable for the following:    Alcohol, Ethyl (B) 277 (*)    All other components within normal limits  CBC WITH DIFFERENTIAL/PLATELET - Abnormal; Notable for the following:    Platelets 149 (*)    All other components within normal limits  URINALYSIS, ROUTINE W REFLEX MICROSCOPIC - Abnormal; Notable for the following:    Color, Urine STRAW (*)    All other components within normal limits  RAPID URINE DRUG SCREEN, HOSP PERFORMED - Abnormal; Notable for the following:    Cocaine POSITIVE (*)    All other components within normal limits    Course of Care: -patient now ambulating in the emergency department. She was reevaluated by neurology. Feels patient's symptoms are likely due to substance abuse. She is now clinically sober and requesting discharge. Will discharge with outpatient precautions. Patient encouraged to stop alcohol and drug use.     Duffy Bruce, MD 07/19/17 1022

## 2017-07-19 NOTE — ED Triage Notes (Signed)
BIB EMS from home, pt called 911 for "not being able to move or breathe" When EMS arrived door was locked and they had to break down door, pt found laying on floor prone with empty liquor bottle beside her. Pt is A/O at times, was able to state her name and location upon arrival to ED. VSS.

## 2017-07-19 NOTE — ED Notes (Signed)
Patient transported to MRI 

## 2017-07-19 NOTE — Discharge Instructions (Addendum)
Continue your medications as before.   Refrain from cocaine use and drinking alcohol in excess.  Follow up with your primary doctor in the next week, and return to the ER if your symptoms significantly worsen or change.

## 2017-07-19 NOTE — Progress Notes (Addendum)
Same day progress note  Subjective Patient seen and examined. Was seen a few hours ago by my colleague Dr. Leonel Ramsay. MRI of the brain has been done and is negative for an acute stroke.  On my exam: Patient was awake alert and oriented. Her speech was mildly dysarthric. Naming comprehension was intact. Poor attention and concentration. Repetition was a little impaired. Cranial nerve exam: Pupils on the right is round and reactive, left prosthetic eye, visual field in the right eye full, face symmetric, auditory acuity intact, palate elevates midline, shoulder shrug intact, tongue midline. Motor exam: Right upper and lower extremities antigravity 5/5. Right lower extremity 4+/5. Left lower extremity barely 3/5 but that exam was effort dependent as she had a positive Hoover sign. Sensory exam: Intact to light touch all over Cerebellar exam: No ataxia on finger-nose-finger Gait testing was deferred for patient's safety   Pertinent labs and imaging reviewed. EtOH 277 CT head negative MRI head negative for straight acute stroke. Urinary toxicology screen positive for cocaine  Assessment and recommendations 56 year old with possible left-sided weakness and numbness, which spares the face, and now is only in the leg and also is effort dependent and improving since her presentation. 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 I suspect a component of malingering to her exam. MRI of the brain is negative for acute stroke and a suspicion for stroke hence is low - though she has history of cocaine use and is at risk for strokes in general.  Recommendations Treatment of compensated migraine per ER I do not think she requires further neuroimaging with brain imaging with thin cuts through the brainstem or C-spine imaging at this time with her improving exam and functional overlay of the exam. Please call neurology with questions  Amie Portland, MD [facility] [phone]  If 7pm to 7am, please call on call as listed on AMION.   No charge note

## 2017-08-14 ENCOUNTER — Ambulatory Visit (HOSPITAL_COMMUNITY): Payer: Self-pay | Admitting: Licensed Clinical Social Worker

## 2017-09-09 ENCOUNTER — Ambulatory Visit (INDEPENDENT_AMBULATORY_CARE_PROVIDER_SITE_OTHER): Payer: Self-pay | Admitting: Physician Assistant

## 2017-09-16 ENCOUNTER — Ambulatory Visit (INDEPENDENT_AMBULATORY_CARE_PROVIDER_SITE_OTHER): Payer: Self-pay | Admitting: Physician Assistant

## 2017-10-30 ENCOUNTER — Ambulatory Visit (INDEPENDENT_AMBULATORY_CARE_PROVIDER_SITE_OTHER): Payer: Self-pay | Admitting: Physician Assistant

## 2017-10-31 ENCOUNTER — Emergency Department (HOSPITAL_COMMUNITY)
Admission: EM | Admit: 2017-10-31 | Discharge: 2017-10-31 | Disposition: A | Discharge status: Home / Self Care | Payer: Self-pay | Attending: Emergency Medicine | Admitting: Emergency Medicine

## 2017-10-31 ENCOUNTER — Encounter (HOSPITAL_COMMUNITY): Payer: Self-pay

## 2017-10-31 ENCOUNTER — Emergency Department (HOSPITAL_COMMUNITY): Payer: Self-pay

## 2017-10-31 ENCOUNTER — Other Ambulatory Visit: Payer: Self-pay

## 2017-10-31 DIAGNOSIS — R4689 Other symptoms and signs involving appearance and behavior: Secondary | ICD-10-CM

## 2017-10-31 DIAGNOSIS — Z79899 Other long term (current) drug therapy: Secondary | ICD-10-CM | POA: Insufficient documentation

## 2017-10-31 DIAGNOSIS — Z7982 Long term (current) use of aspirin: Secondary | ICD-10-CM | POA: Insufficient documentation

## 2017-10-31 DIAGNOSIS — F17210 Nicotine dependence, cigarettes, uncomplicated: Secondary | ICD-10-CM | POA: Insufficient documentation

## 2017-10-31 DIAGNOSIS — F1010 Alcohol abuse, uncomplicated: Secondary | ICD-10-CM

## 2017-10-31 DIAGNOSIS — F1994 Other psychoactive substance use, unspecified with psychoactive substance-induced mood disorder: Secondary | ICD-10-CM | POA: Diagnosis present

## 2017-10-31 DIAGNOSIS — F329 Major depressive disorder, single episode, unspecified: Secondary | ICD-10-CM

## 2017-10-31 DIAGNOSIS — R4182 Altered mental status, unspecified: Secondary | ICD-10-CM | POA: Insufficient documentation

## 2017-10-31 HISTORY — DX: Bipolar disorder, unspecified: F31.9

## 2017-10-31 HISTORY — DX: Unspecified convulsions: R56.9

## 2017-10-31 LAB — CBC WITH DIFFERENTIAL/PLATELET
Basophils Absolute: 0 10*3/uL (ref 0.0–0.1)
Basophils Relative: 0 %
EOS ABS: 0.1 10*3/uL (ref 0.0–0.7)
EOS PCT: 1 %
HCT: 38.1 % (ref 36.0–46.0)
Hemoglobin: 14.1 g/dL (ref 12.0–15.0)
LYMPHS ABS: 3.3 10*3/uL (ref 0.7–4.0)
LYMPHS PCT: 56 %
MCH: 33.4 pg (ref 26.0–34.0)
MCHC: 37 g/dL — AB (ref 30.0–36.0)
MCV: 90.3 fL (ref 78.0–100.0)
MONOS PCT: 9 %
Monocytes Absolute: 0.5 10*3/uL (ref 0.1–1.0)
Neutro Abs: 2 10*3/uL (ref 1.7–7.7)
Neutrophils Relative %: 34 %
PLATELETS: 165 10*3/uL (ref 150–400)
RBC: 4.22 MIL/uL (ref 3.87–5.11)
RDW: 13.5 % (ref 11.5–15.5)
WBC: 5.9 10*3/uL (ref 4.0–10.5)

## 2017-10-31 LAB — COMPREHENSIVE METABOLIC PANEL
ALK PHOS: 58 U/L (ref 38–126)
ALT: 27 U/L (ref 14–54)
ANION GAP: 9 (ref 5–15)
AST: 34 U/L (ref 15–41)
Albumin: 4 g/dL (ref 3.5–5.0)
BUN: 8 mg/dL (ref 6–20)
CALCIUM: 9.2 mg/dL (ref 8.9–10.3)
CO2: 23 mmol/L (ref 22–32)
CREATININE: 0.57 mg/dL (ref 0.44–1.00)
Chloride: 108 mmol/L (ref 101–111)
Glucose, Bld: 105 mg/dL — ABNORMAL HIGH (ref 65–99)
Potassium: 3.6 mmol/L (ref 3.5–5.1)
SODIUM: 140 mmol/L (ref 135–145)
Total Bilirubin: 0.7 mg/dL (ref 0.3–1.2)
Total Protein: 7.5 g/dL (ref 6.5–8.1)

## 2017-10-31 LAB — ETHANOL: ALCOHOL ETHYL (B): 151 mg/dL — AB (ref <10)

## 2017-10-31 LAB — RAPID URINE DRUG SCREEN, HOSP PERFORMED
Amphetamines: NOT DETECTED
Barbiturates: NOT DETECTED
Benzodiazepines: NOT DETECTED
COCAINE: POSITIVE — AB
OPIATES: NOT DETECTED
Tetrahydrocannabinol: NOT DETECTED

## 2017-10-31 MED ORDER — VITAMIN B-1 100 MG PO TABS: 100.0000 mg | ORAL_TABLET | Freq: Every day | ORAL | Status: DC

## 2017-10-31 MED ORDER — ACETAMINOPHEN 325 MG PO TABS: 650.0000 mg | ORAL_TABLET | ORAL | Status: DC | PRN

## 2017-10-31 MED ORDER — LORAZEPAM 1 MG PO TABS: 0.0000 mg | ORAL_TABLET | Freq: Four times a day (QID) | ORAL | Status: DC

## 2017-10-31 MED ORDER — BENZTROPINE MESYLATE 1 MG PO TABS
1.0000 mg | ORAL_TABLET | Freq: Four times a day (QID) | ORAL | Status: DC | PRN
  Administered 2017-10-31: 1 mg via ORAL
  Filled 2017-10-31: qty 1

## 2017-10-31 MED ORDER — ALUM & MAG HYDROXIDE-SIMETH 200-200-20 MG/5ML PO SUSP: 30.0000 mL | Freq: Four times a day (QID) | ORAL | Status: DC | PRN

## 2017-10-31 MED ORDER — LORAZEPAM 1 MG PO TABS: 0.0000 mg | ORAL_TABLET | Freq: Two times a day (BID) | ORAL | Status: DC

## 2017-10-31 MED ORDER — HALOPERIDOL 5 MG PO TABS
5.0000 mg | ORAL_TABLET | Freq: Four times a day (QID) | ORAL | Status: DC | PRN
  Administered 2017-10-31: 5 mg via ORAL
  Filled 2017-10-31: qty 1

## 2017-10-31 MED ORDER — NICOTINE 21 MG/24HR TD PT24: 21.0000 mg | MEDICATED_PATCH | Freq: Every day | TRANSDERMAL | Status: DC

## 2017-10-31 MED ORDER — THIAMINE HCL 100 MG/ML IJ SOLN: 100.0000 mg | Freq: Every day | INTRAMUSCULAR | Status: DC

## 2017-10-31 MED ORDER — LORAZEPAM 2 MG/ML IJ SOLN: 0.0000 mg | Freq: Two times a day (BID) | INTRAMUSCULAR | Status: DC

## 2017-10-31 MED ORDER — ZOLPIDEM TARTRATE 5 MG PO TABS: 5.0000 mg | ORAL_TABLET | Freq: Every evening | ORAL | Status: DC | PRN

## 2017-10-31 MED ORDER — LORAZEPAM 2 MG/ML IJ SOLN: 0.0000 mg | Freq: Four times a day (QID) | INTRAMUSCULAR | Status: DC

## 2017-10-31 MED ORDER — SERTRALINE HCL 50 MG PO TABS: 50.0000 mg | ORAL_TABLET | Freq: Every day | ORAL | Status: DC
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
## 2017-10-31 NOTE — Discharge Instructions (Signed)
To help you maintain a sober lifestyle, a substance abuse treatment program may be beneficial to you.  Contact one of the following facilities at your earliest opportunity to ask about enrolling: ° °RESIDENTIAL PROGRAMS: ° °     [facility] °     [address] °     [place], [place] [address] °     [phone] ° °     [facility] °     [address] °     [place], [place] [address] °     [phone] ° °     [facility] °     [address] °     [place], [place] [address] °     [phone] ° °OUTPATIENT PROGRAMS: ° °     [facility] °     [address] °     [place], [place] [address] °     [phone] °     New patients are seen at the walk-in clinic every Tuesday from 9:00 am - 12:00 pm °

## 2017-10-31 NOTE — Patient Outreach (Signed)
[facility] met with the patient and offered substance use recovery support. Patient stated that she did not want any assistance with help finding a substance use support group or substance use treatment. [facility] still provided [facility] contact information and encouraged the patient to contact [facility] at any time for substance use recovery support.
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
## 2017-10-31 NOTE — BH Assessment (Signed)
[facility] Assessment Progress Note  PerLaurie Nicky Pugh, FNP, this patient does not require psychiatric hospitalization at this time.Patient is to be discharged from [facility] with referral information for area substance abuse treatment providers.This has been included in patient's discharge instructions.Patient will be seen by [facility]; to assist with exploring OP resources.

## 2017-10-31 NOTE — ED Notes (Signed)
Bed: BV13 Expected date:  Expected time:  Means of arrival:  Comments: Hold for triage 5
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
## 2017-10-31 NOTE — BH Assessment (Addendum)
[facility] Assessment Progress Note  Per Ethelene Hal, FNP, this pt does not require psychiatric hospitalization at this time.  Pt is to be discharged from [facility] with referral information for area substance abuse treatment providers.  This has been included in pt's discharge instructions.  Pt would also benefit from seeing Peer Support Specialists; they will be asked to speak to pt.  Pt's nurse, Marzetta Board, has been notified.  Jalene Mullet, [place] Triage Specialist [phone]

## 2017-10-31 NOTE — ED Notes (Signed)
TTS at bedside. 

## 2017-11-07 ENCOUNTER — Ambulatory Visit (INDEPENDENT_AMBULATORY_CARE_PROVIDER_SITE_OTHER): Payer: Self-pay | Admitting: Physician Assistant

## 2017-11-19 ENCOUNTER — Encounter (INDEPENDENT_AMBULATORY_CARE_PROVIDER_SITE_OTHER): Payer: Self-pay | Admitting: Physician Assistant

## 2017-11-19 ENCOUNTER — Ambulatory Visit (INDEPENDENT_AMBULATORY_CARE_PROVIDER_SITE_OTHER): Payer: Self-pay | Admitting: Physician Assistant

## 2017-11-19 VITALS — BP 128/84 | HR 66 | Temp 97.7°F | Resp 18 | Ht 64.0 in | Wt 187.0 lb

## 2017-11-19 DIAGNOSIS — F3162 Bipolar disorder, current episode mixed, moderate: Secondary | ICD-10-CM

## 2017-11-19 DIAGNOSIS — Z23 Encounter for immunization: Secondary | ICD-10-CM

## 2017-11-19 DIAGNOSIS — F1020 Alcohol dependence, uncomplicated: Secondary | ICD-10-CM

## 2017-11-19 DIAGNOSIS — Z09 Encounter for follow-up examination after completed treatment for conditions other than malignant neoplasm: Secondary | ICD-10-CM

## 2017-11-19 MED ORDER — ACAMPROSATE CALCIUM 333 MG PO TBEC: 666.0000 mg | DELAYED_RELEASE_TABLET | Freq: Three times a day (TID) | ORAL | 0 refills | Status: DC

## 2017-11-19 MED ORDER — LORAZEPAM 2 MG PO TABS: ORAL_TABLET | ORAL | 0 refills | Status: DC

## 2017-11-19 MED ORDER — B COMPLEX-C PO TABS: 1.0000 | ORAL_TABLET | Freq: Every day | ORAL | 0 refills | Status: DC

## 2017-11-19 MED ORDER — RISPERIDONE 1 MG PO TABS: 1.0000 mg | ORAL_TABLET | Freq: Every day | ORAL | 2 refills | Status: DC

## 2017-11-19 MED ORDER — VITAMIN B-1 100 MG PO TABS: 100.0000 mg | ORAL_TABLET | Freq: Three times a day (TID) | ORAL | 0 refills | Status: DC

## 2017-11-19 MED FILL — ?RISPERIDONE 1MG TABLET: 1 | 30 days supply | Qty: 30 | Fill #0

## 2017-11-19 MED FILL — ACAMPROSATE CALC DR 333 MG: 333 | 30 days supply | Qty: 180 | Fill #0

## 2017-11-19 NOTE — Patient Instructions (Signed)
Community Resources  Advocacy/Legal Legal Aid [place]:  1-866-219-5262  /  336-272-0148  Family Justice Center:  336-641-7233  [facility] [place] 24-hr Crisis line:  336-273-7273  Women's Resource Center, [place]:  336-275-6090  Court Watch (custody):  336-275-2346  [place] Humanitarian Law Clinic:   336-279-9299    Baby & Breastfeeding Car Seat Inspection @ Various [place] Fire Depts.- call 336-373-2177  [place] Lactation  336-832-6860  [place] [facility]act[facility] 336-878-6712  WIC: 336-641-3663 ([place]);  336-641-7571 ([place])  La Leche League:  1-877-452-5321   Childcare [place] Child Development: 336-369-5097 ([place]) / 336-887-8224 ([place])  - Child Care Resources/ Referrals/ Scholarships  - Head Start/ Early Head Start (call or apply online)  [place] DHHS: [place] Pre-K :  1-800-859-0829 / 336-274-5437   Employment / Job Search [facility] [place]: 336-275-6090 / [address]  [place] [employer] Center (JobLink): 336-373-5922 ([place]) / 336-882-4141 ([place])  [facility] [employer] Community Resource/ Career Center: 336-275-9801 / 336-282-7307  [place] [facility] Job & Career Center: 336-373-3764  DHHS Work First: 336-641-3447 ([place]) / 336-641-3447 ([place])  StepUp Ministry [place]:  336-676-5871   Financial Assistance [place] [facility]:  336-553-2657  Salvation Army: 336-235-0368  [place] Network (furniture):  336-370-4002  [place] Helping Hands: 336-373-4264  Low Income Energy Assistance  336-641-3000   Food Assistance DHHS- SNAP/ Food Stamps: 336-641-4588  WIC: [place]- 336-641-3663 ;  [place] 336-641-7571  Little Green Book- Free Meals  Little Blue Book- Free Food Pantries  During the summer, text "FOOD" to 877877   General Health / Clinics (Adults) Orange Card (for Adults) through [place] [facility]: (336) 895-4900  [facility] [place] [facility]:   336-832-8035  [place] Community Health & Wellness:   336-832-4444  Health Department:  336-641-3245  [place] [facility]:  336-415-3877 / 336-641-2100  Planned Parenthood of [place]:   336-373-0678  [facility]:   336-334-4822 x 50251   Housing [place] Housing Coalition:   336-691-9521  [place] Housing Authority:  336-275-8501  Affordable Housing Managemnt:  336-273-0568   Immigrant/ Refugee Center for [place]ians (UNCG):  336-256-1065  Faith Action International House:  336-379-0037  [employer]rivals Institute:  336-937-4701  Church World Services:  336-617-0381  African Services Coalition:  336-574-2677   LGBTQ YouthSAFE  www.youthsafe[place].org  PFLAG  336-541-6754 / info@[place].org  The Trevor Project:  1-866-488-7386   Mental Health/ Substance Use [facility] [place]  336-387-6161   Health:  336-832-9700 or 1-800-711-2635  [place] Circle of Care:  336-271-5888  [facility]:  336-294-1349  [place] Care Services:  336-542-2884  [facility] (walk-ins)  336-676-6840 / [address]  [place]:  800-449-1287  Alcoholics Anonymous:  336-854-4278  Narcotics Anonymous:  800-365-1036  Quit Smoking Hotline:  800-QUIT-NOW (800-784-8669)   Parenting Children's Home Society:  800-632-1400  [place]: Education Center & Support Groups:  336-832-6682  YWCA: 336-273-3461  UNCG: Bringing Out the Best:  336-334-3120               Thriving at Three (Hispanic families): 336-256-1066  Healthy Start ([facility] [place]):  336-387-6161 x2288  Parents as Teachers:  336-691-0024  [place] Child Development- Learning Together (Immigrants): 336-369-5001   Poison Control 800-222-1222  Sports & Recreation [employer] Open Doors Application: ymcanwnc.org/join/open-doors-financial-assistance/  City of [place] Recreation Centers: http://www.[place].gov/index.aspx?page=3615   Special Needs Family Support Network:  336-832-6507  Autism Society of [place]:   336-333-0197 x1402 or x1412 /  800-785-1035  TEACCH :  336-334-5773     ARC of [place]:  336-373-1076  Children's Developmental Service Agency (CDSA):  336-334-5601  CC4C (Care Coordination for Children):  336-641-7641   Transportation Medicaid Transportation: 336-641-4848 to apply  [place] Transit Authority: 336-335-6499 (reduced-fare bus ID to Medicaid/ Medicare/ Orange Card)  SCAT Paratransit services: Eligible riders only, call 336-333-6589 for application   Tutoring/Mentoring Black Child Development Institute: 336-230-2138  Big Brothers/ Big Sisters: 336-378-9100 ([place])  336-882-4167 ([place])  ACES through child's school: 336-370-2321  [employer] Achievers: contact your local Y  SHIELD Mentor Program: 336-337-2771   

## 2017-11-19 NOTE — Progress Notes (Signed)
Subjective:  Patient ID: Janet Ramos, female    DOB: [birth year]  Age: 57 y.o. MRN: [id number]  CC: f/u   HPI Janet Ramos is a 57 y.o. female with a medical history of alcoholism, bipolar 1 disorder, seizures, left eye blindness, and trichomoniasis presents on hospital f/u. Taken to ED on 10/31/17 (19 days ago) for aggressive behavior. Admitted to drinking liquor daily, using marijuana, and using cocaine. ETOH level 151. Reported not taking her prescribed medication for three weeks. CT head with no acute findings. Diagnosed with major depressive disorder. Has not been to the psychologist in four months. Used to drink two 40 oz beers per day but has only drunk one 12 oz beer daily since discharge from the hospital. Drinks 12 oz beers to avoid DTs. Says she is willing to take medications to help her stop drinking. Does not endorse any other symptom or complaint.       Outpatient Medications Prior to Visit  Medication Sig Dispense Refill  . ibuprofen (ADVIL,MOTRIN) 200 MG tablet Take 800 mg by mouth every 6 (six) hours as needed for mild pain or moderate pain.    . Multiple Vitamin (MULTIVITAMIN WITH MINERALS) TABS tablet Take 1 tablet by mouth daily. 30 tablet 0  . aspirin EC 325 MG EC tablet Take 1 tablet (325 mg total) by mouth daily. (Patient not taking: Reported on 10/31/2017) 30 tablet 0  . clotrimazole (RA CLOTRIMAZOLE) 1 % cream Apply 1 application topically 2 (two) times daily. (Patient not taking: Reported on 04/21/2017) 30 g 0  . ibuprofen (ADVIL,MOTRIN) 600 MG tablet Take 1 tablet (600 mg total) by mouth every 8 (eight) hours as needed. (Patient not taking: Reported on 04/21/2017) 30 tablet 0   No facility-administered medications prior to visit.      ROS Review of Systems  Constitutional: Negative for chills, fever and malaise/fatigue.  Eyes: Negative for blurred vision.  Respiratory: Negative for shortness of breath.   Cardiovascular: Negative for chest pain and palpitations.   Gastrointestinal: Negative for abdominal pain and nausea.  Genitourinary: Negative for dysuria and hematuria.  Musculoskeletal: Negative for joint pain and myalgias.  Skin: Negative for rash.  Neurological: Positive for tremors (from alcohol withdrawal). Negative for tingling and headaches.  Psychiatric/Behavioral: Negative for depression. The patient is not nervous/anxious.     Objective:  BP 128/84 (BP Location: Left Arm, Patient Position: Sitting, Cuff Size: Large)   Pulse 66   Temp 97.7 F (36.5 C) (Oral)   Resp 18   Ht 5\' 4"  (1.626 m)   Wt 187 lb (84.8 kg)   LMP 03/15/2011   SpO2 100%   BMI 32.10 kg/m   BP/Weight 11/19/2017 10/31/2017 40/06/8118  Systolic BP 147 829 562  Diastolic BP 84 71 74  Wt. (Lbs) 187 190 180  BMI 32.1 32.61 30.9      Physical Exam  Constitutional: She is oriented to person, place, and time.  Well developed, overweight, NAD, polite  HENT:  Head: Normocephalic and atraumatic.  Eyes: No scleral icterus.  Neck: Normal range of motion. Neck supple. No thyromegaly present.  Cardiovascular: Normal rate, regular rhythm and normal heart sounds.  Pulmonary/Chest: Effort normal and breath sounds normal.  Abdominal: Soft. Bowel sounds are normal. She exhibits no distension and no mass. There is no tenderness.  Musculoskeletal: She exhibits no edema.  Neurological: She is alert and oriented to person, place, and time. No cranial nerve deficit. Coordination normal.  Skin: Skin is warm and dry. No  rash noted. No erythema. No pallor.  Psychiatric: She has a normal mood and affect. Her behavior is normal. Thought content normal.  Vitals reviewed.    Assessment & Plan:    1. Bipolar disorder, current episode mixed, moderate (HCC) - Begin risperiDONE (RISPERDAL) 1 MG tablet; Take 1 tablet (1 mg total) by mouth at bedtime.  Dispense: 30 tablet; Refill: 2 - I have asked patient to contact [facility] or walk in to [facility] should she feel the need to speak to a  psychologist or psychiatrist. Beverly Sessions contact info provided to patient.   2. Alcoholism ([place]) - acamprosate (CAMPRAL) 333 MG tablet; Take 2 tablets (666 mg total) by mouth 3 (three) times daily with meals.  Dispense: 180 tablet; Refill: 0 - LORazepam (ATIVAN) 2 MG tablet; Take one tablet (2 mg) every hour so long as symptoms of delirium tremens are present. Do not exceed 10 mg per day.  Dispense: 25 tablet; Refill: 0. Patient was instructed on sig, she understood and repeated back instruction. - thiamine (VITAMIN B-1) 100 MG tablet; Take 1 tablet (100 mg total) by mouth 3 (three) times daily.  Dispense: 42 tablet; Refill: 0 - B Complex-C (B-COMPLEX WITH VITAMIN C) tablet; Take 1 tablet by mouth daily.  Dispense: 30 tablet; Refill: 0  3. Need for prophylactic vaccination and inoculation against influenza - Flu Vaccine QUAD 6+ mos PF IM (Fluarix Quad PF)  4. Hospital discharge follow-up - Records reviewed   Meds ordered this encounter  Medications  . risperiDONE (RISPERDAL) 1 MG tablet    Sig: Take 1 tablet (1 mg total) by mouth at bedtime.    Dispense:  30 tablet    Refill:  2    Order Specific Question:   Supervising Provider    Answer:   Tresa Garter [id number]  . acamprosate (CAMPRAL) 333 MG tablet    Sig: Take 2 tablets (666 mg total) by mouth 3 (three) times daily with meals.    Dispense:  180 tablet    Refill:  0    Order Specific Question:   Supervising Provider    Answer:   Tresa Garter [id number]  . LORazepam (ATIVAN) 2 MG tablet    Sig: Take one tablet (2 mg) every hour so long as symptoms of delirium tremens are present. Do not exceed 10 mg per day.    Dispense:  25 tablet    Refill:  0    Order Specific Question:   Supervising Provider    Answer:   Tresa Garter [id number]  . thiamine (VITAMIN B-1) 100 MG tablet    Sig: Take 1 tablet (100 mg total) by mouth 3 (three) times daily.    Dispense:  42 tablet    Refill:  0    Order Specific Question:    Supervising Provider    Answer:   Tresa Garter [id number]  . B Complex-C (B-COMPLEX WITH VITAMIN C) tablet    Sig: Take 1 tablet by mouth daily.    Dispense:  30 tablet    Refill:  0    Order Specific Question:   Supervising Provider    Answer:   Tresa Garter [id number]    Follow-up: Return in about 2 weeks (around 12/03/2017) for alcoholism and bipolar.   Clent Demark PA

## 2017-12-04 ENCOUNTER — Ambulatory Visit (INDEPENDENT_AMBULATORY_CARE_PROVIDER_SITE_OTHER): Payer: Self-pay | Admitting: Physician Assistant

## 2017-12-12 ENCOUNTER — Other Ambulatory Visit (INDEPENDENT_AMBULATORY_CARE_PROVIDER_SITE_OTHER): Payer: Self-pay | Admitting: Physician Assistant

## 2017-12-12 ENCOUNTER — Encounter (INDEPENDENT_AMBULATORY_CARE_PROVIDER_SITE_OTHER): Payer: Self-pay | Admitting: Physician Assistant

## 2017-12-12 ENCOUNTER — Other Ambulatory Visit (INDEPENDENT_AMBULATORY_CARE_PROVIDER_SITE_OTHER): Payer: Self-pay | Admitting: Orthopaedic Surgery

## 2017-12-12 ENCOUNTER — Ambulatory Visit (INDEPENDENT_AMBULATORY_CARE_PROVIDER_SITE_OTHER): Payer: Self-pay | Admitting: Physician Assistant

## 2017-12-12 VITALS — BP 114/71 | HR 77 | Temp 97.5°F | Resp 18 | Ht 64.0 in | Wt 191.0 lb

## 2017-12-12 DIAGNOSIS — Z1231 Encounter for screening mammogram for malignant neoplasm of breast: Secondary | ICD-10-CM

## 2017-12-12 DIAGNOSIS — F3175 Bipolar disorder, in partial remission, most recent episode depressed: Secondary | ICD-10-CM

## 2017-12-12 DIAGNOSIS — Z79899 Other long term (current) drug therapy: Secondary | ICD-10-CM

## 2017-12-12 DIAGNOSIS — F1020 Alcohol dependence, uncomplicated: Secondary | ICD-10-CM

## 2017-12-12 DIAGNOSIS — D649 Anemia, unspecified: Secondary | ICD-10-CM

## 2017-12-12 DIAGNOSIS — Z1239 Encounter for other screening for malignant neoplasm of breast: Secondary | ICD-10-CM

## 2017-12-12 DIAGNOSIS — Z13220 Encounter for screening for lipoid disorders: Secondary | ICD-10-CM

## 2017-12-12 MED ORDER — ACAMPROSATE CALCIUM 333 MG PO TBEC: 666.0000 mg | DELAYED_RELEASE_TABLET | Freq: Three times a day (TID) | ORAL | 3 refills | Status: AC

## 2017-12-12 MED ORDER — FERROUS SULFATE 325 (65 FE) MG PO TBEC: 325.0000 mg | DELAYED_RELEASE_TABLET | Freq: Three times a day (TID) | ORAL | 0 refills | Status: DC

## 2017-12-12 MED ORDER — DOCUSATE SODIUM 50 MG PO CAPS: 50.0000 mg | ORAL_CAPSULE | Freq: Two times a day (BID) | ORAL | 0 refills | Status: DC

## 2017-12-12 NOTE — Patient Instructions (Signed)
Anemia Anemia is a condition in which you do not have enough red blood cells or hemoglobin. Hemoglobin is a substance in red blood cells that carries oxygen. When you do not have enough red blood cells or hemoglobin (are anemic), your body cannot get enough oxygen and your organs may not work properly. As a result, you may feel very tired or have other problems. What are the causes? Common causes of anemia include:  Excessive bleeding. Anemia can be caused by excessive bleeding inside or outside the body, including bleeding from the intestine or from periods in women.  Poor nutrition.  Long-lasting (chronic) kidney, thyroid, and liver disease.  Bone marrow disorders.  Cancer and treatments for cancer.  HIV (human immunodeficiency virus) and AIDS (acquired immunodeficiency syndrome).  Treatments for HIV and AIDS.  Spleen problems.  Blood disorders.  Infections, medicines, and autoimmune disorders that destroy red blood cells.  What are the signs or symptoms? Symptoms of this condition include:  Minor weakness.  Dizziness.  Headache.  Feeling heartbeats that are irregular or faster than normal (palpitations).  Shortness of breath, especially with exercise.  Paleness.  Cold sensitivity.  Indigestion.  Nausea.  Difficulty sleeping.  Difficulty concentrating.  Symptoms may occur suddenly or develop slowly. If your anemia is mild, you may not have symptoms. How is this diagnosed? This condition is diagnosed based on:  Blood tests.  Your medical history.  A physical exam.  Bone marrow biopsy.  Your health care provider may also check your stool (feces) for blood and may do additional testing to look for the cause of your bleeding. You may also have other tests, including:  Imaging tests, such as a CT scan or MRI.  Endoscopy.  Colonoscopy.  How is this treated? Treatment for this condition depends on the cause. If you continue to lose a lot of blood,  you may need to be treated at a hospital. Treatment may include:  Taking supplements of iron, vitamin B12, or folic acid.  Taking a hormone medicine (erythropoietin) that can help to stimulate red blood cell growth.  Having a blood transfusion. This may be needed if you lose a lot of blood.  Making changes to your diet.  Having surgery to remove your spleen.  Follow these instructions at home:  Take over-the-counter and prescription medicines only as told by your health care provider.  Take supplements only as told by your health care provider.  Follow any diet instructions that you were given.  Keep all follow-up visits as told by your health care provider. This is important. Contact a health care provider if:  You develop new bleeding anywhere in the body. Get help right away if:  You are very weak.  You are short of breath.  You have pain in your abdomen or chest.  You are dizzy or feel faint.  You have trouble concentrating.  You have bloody or black, tarry stools.  You vomit repeatedly or you vomit up blood. Summary  Anemia is a condition in which you do not have enough red blood cells or enough of a substance in your red blood cells that carries oxygen (hemoglobin).  Symptoms may occur suddenly or develop slowly.  If your anemia is mild, you may not have symptoms.  This condition is diagnosed with blood tests as well as a medical history and physical exam. Other tests may be needed.  Treatment for this condition depends on the cause of the anemia. This information is not intended to replace advice   given to you by your health care provider. Make sure you discuss any questions you have with your health care provider. Document Released: 11/07/2004 Document Revised: 11/01/2016 Document Reviewed: 11/01/2016 Elsevier Interactive Patient Education  [employer].

## 2017-12-12 NOTE — Progress Notes (Signed)
Subjective:  Patient ID: Janet Ramos, female    DOB: [birth year]  Age: 57 y.o. MRN: [id number]  CC: f/u  HPI Janet Ramos is a 57 y.o. female with a medical history of alcoholism, bipolar 1 disorder, seizures, left eye blindness, and trichomoniasis presents to f/u on bipolar disorder and alcoholism. Says the acamprosate is working on the urges to drink. Has cut down "40%" on drinking beer. Last drink was a 40 oz beer last night. Has not filled Lorazepam or thiamine yet. Has not had the money to fill them. Does not have any symptoms of DTs. Does not endorse any other symptoms or complaints.      Outpatient Medications Prior to Visit  Medication Sig Dispense Refill  . acamprosate (CAMPRAL) 333 MG tablet Take 2 tablets (666 mg total) by mouth 3 (three) times daily with meals. 180 tablet 0  . B Complex-C (B-COMPLEX WITH VITAMIN C) tablet Take 1 tablet by mouth daily. 30 tablet 0  . ibuprofen (ADVIL,MOTRIN) 200 MG tablet Take 800 mg by mouth every 6 (six) hours as needed for mild pain or moderate pain.    Marland Kitchen LORazepam (ATIVAN) 2 MG tablet Take one tablet (2 mg) every hour so long as symptoms of delirium tremens are present. Do not exceed 10 mg per day. 25 tablet 0  . risperiDONE (RISPERDAL) 1 MG tablet Take 1 tablet (1 mg total) by mouth at bedtime. 30 tablet 2  . Multiple Vitamin (MULTIVITAMIN WITH MINERALS) TABS tablet Take 1 tablet by mouth daily. (Patient not taking: Reported on 12/12/2017) 30 tablet 0  . thiamine (VITAMIN B-1) 100 MG tablet Take 1 tablet (100 mg total) by mouth 3 (three) times daily. (Patient not taking: Reported on 12/12/2017) 42 tablet 0   No facility-administered medications prior to visit.      ROS Review of Systems  Constitutional: Negative for chills, fever and malaise/fatigue.  Eyes: Negative for blurred vision.  Respiratory: Negative for shortness of breath.   Cardiovascular: Negative for chest pain and palpitations.  Gastrointestinal: Negative for abdominal  pain and nausea.  Genitourinary: Negative for dysuria and hematuria.  Musculoskeletal: Negative for joint pain and myalgias.  Skin: Negative for rash.  Neurological: Negative for tingling and headaches.  Psychiatric/Behavioral: Negative for depression. The patient is not nervous/anxious.     Objective:  BP 114/71 (BP Location: Left Arm, Patient Position: Sitting, Cuff Size: Large)   Pulse 77   Temp (!) 97.5 F (36.4 C) (Oral)   Resp 18   Ht 5\' 4"  (1.626 m)   Wt 191 lb (86.6 kg)   LMP 03/15/2011   SpO2 97%   BMI 32.79 kg/m   BP/Weight 12/12/2017 11/19/2017 0/24/0973  Systolic BP 532 992 426  Diastolic BP 71 84 71  Wt. (Lbs) 191 187 190  BMI 32.79 32.1 32.61      Physical Exam  Constitutional: She is oriented to person, place, and time.  Well developed, well nourished, NAD, polite  HENT:  Head: Normocephalic and atraumatic.  Eyes: Conjunctivae are normal. No scleral icterus.  Neck: Normal range of motion. Neck supple. No thyromegaly present.  Cardiovascular: Normal rate, regular rhythm and normal heart sounds.  Pulmonary/Chest: Effort normal and breath sounds normal.  Musculoskeletal: She exhibits no edema.  Neurological: She is alert and oriented to person, place, and time. No cranial nerve deficit. Coordination normal.  Skin: Skin is warm and dry. No rash noted. No erythema. No pallor.  Psychiatric: She has a normal mood and affect.  Her behavior is normal. Thought content normal.  Vitals reviewed.    Assessment & Plan:   1. Bipolar disorder, in partial remission, most recent episode depressed (HCC) - Risperidone reportedly effective at 1 mg dose. Continue Risperidone and will f/u on progress.  2. Alcoholism ([place]) - Refill acamprosate (CAMPRAL) 333 MG tablet; Take 2 tablets (666 mg total) by mouth 3 (three) times daily with meals.  Dispense: 180 tablet; Refill: 3 - Has not filled Lorazepam or Thiamine yet. I have asked for her to take prescriptions to be filled and  medications used when she begins to have symptoms of DTs. - Comprehensive metabolic panel - CBC with Differential  3. Screening for breast cancer - [place] DIGITAL SCREENING BILATERAL; Future  4. Screening for hypercholesterolemia - Lipid panel  5. Anemia, unspecified type - Begin Ferrous sulfate 325 mg TID x30 days  6. High risk medication use - Begin Docusate for drug induced constipation   Meds ordered this encounter  Medications  . acamprosate (CAMPRAL) 333 MG tablet    Sig: Take 2 tablets (666 mg total) by mouth 3 (three) times daily with meals.    Dispense:  180 tablet    Refill:  3    Order Specific Question:   Supervising Provider    Answer:   Tresa Garter [id number]  . ferrous sulfate 325 (65 FE) MG EC tablet    Sig: Take 1 tablet (325 mg total) by mouth 3 (three) times daily with meals.    Dispense:  90 tablet    Refill:  0    Order Specific Question:   Supervising Provider    Answer:   Tresa Garter [id number]  . docusate sodium (COLACE) 50 MG capsule    Sig: Take 1 capsule (50 mg total) by mouth 2 (two) times daily.    Dispense:  30 capsule    Refill:  0    Order Specific Question:   Supervising Provider    Answer:   Tresa Garter [[id number]]    Follow-up: Return in about 1 week (around 12/19/2017) for pap.   Clent Demark PA

## 2017-12-13 LAB — CBC WITH DIFFERENTIAL/PLATELET
BASOS ABS: 0 10*3/uL (ref 0.0–0.2)
Basos: 0 %
EOS (ABSOLUTE): 0.1 10*3/uL (ref 0.0–0.4)
EOS: 2 %
HEMATOCRIT: 37.7 % (ref 34.0–46.6)
HEMOGLOBIN: 13.2 g/dL (ref 11.1–15.9)
Immature Grans (Abs): 0 10*3/uL (ref 0.0–0.1)
Immature Granulocytes: 0 %
LYMPHS ABS: 2.4 10*3/uL (ref 0.7–3.1)
Lymphs: 48 %
MCH: 32.9 pg (ref 26.6–33.0)
MCHC: 35 g/dL (ref 31.5–35.7)
MCV: 94 fL (ref 79–97)
MONOCYTES: 10 %
Monocytes Absolute: 0.5 10*3/uL (ref 0.1–0.9)
NEUTROS ABS: 2 10*3/uL (ref 1.4–7.0)
Neutrophils: 40 %
Platelets: 177 10*3/uL (ref 150–379)
RBC: 4.01 x10E6/uL (ref 3.77–5.28)
RDW: 14.3 % (ref 12.3–15.4)
WBC: 5.1 10*3/uL (ref 3.4–10.8)

## 2017-12-13 LAB — COMPREHENSIVE METABOLIC PANEL
ALBUMIN: 3.9 g/dL (ref 3.5–5.5)
ALT: 30 IU/L (ref 0–32)
AST: 33 IU/L (ref 0–40)
Albumin/Globulin Ratio: 1.3 (ref 1.2–2.2)
Alkaline Phosphatase: 59 IU/L (ref 39–117)
BILIRUBIN TOTAL: 0.2 mg/dL (ref 0.0–1.2)
BUN / CREAT RATIO: 9 (ref 9–23)
BUN: 7 mg/dL (ref 6–24)
CALCIUM: 9.7 mg/dL (ref 8.7–10.2)
CHLORIDE: 106 mmol/L (ref 96–106)
CO2: 23 mmol/L (ref 20–29)
CREATININE: 0.77 mg/dL (ref 0.57–1.00)
GFR calc non Af Amer: 88 mL/min/{1.73_m2} (ref >59)
GFR, EST AFRICAN AMERICAN: 101 mL/min/{1.73_m2} (ref >59)
GLUCOSE: 88 mg/dL (ref 65–99)
Globulin, Total: 3.1 g/dL (ref 1.5–4.5)
Potassium: 4.6 mmol/L (ref 3.5–5.2)
Sodium: 143 mmol/L (ref 134–144)
TOTAL PROTEIN: 7 g/dL (ref 6.0–8.5)

## 2017-12-13 LAB — LIPID PANEL
CHOLESTEROL TOTAL: 231 mg/dL — AB (ref 100–199)
Chol/HDL Ratio: 3.6 ratio (ref 0.0–4.4)
HDL: 65 mg/dL (ref >39)
LDL CALC: 141 mg/dL — AB (ref 0–99)
TRIGLYCERIDES: 125 mg/dL (ref 0–149)
VLDL CHOLESTEROL CAL: 25 mg/dL (ref 5–40)

## 2017-12-16 ENCOUNTER — Other Ambulatory Visit (INDEPENDENT_AMBULATORY_CARE_PROVIDER_SITE_OTHER): Payer: Self-pay | Admitting: Physician Assistant

## 2017-12-16 ENCOUNTER — Telehealth (INDEPENDENT_AMBULATORY_CARE_PROVIDER_SITE_OTHER): Payer: Self-pay

## 2017-12-16 DIAGNOSIS — E7841 Elevated Lipoprotein(a): Secondary | ICD-10-CM

## 2017-12-16 MED ORDER — ATORVASTATIN CALCIUM 40 MG PO TABS: 40.0000 mg | ORAL_TABLET | Freq: Every day | ORAL | 3 refills | Status: DC

## 2017-12-16 NOTE — Telephone Encounter (Signed)
-----   Message from Clent Demark, PA-C sent at 12/16/2017  1:39 PM EST ----- Cholesterol elevated. I will send atorvastatin to [facility] at [address].

## 2017-12-16 NOTE — Telephone Encounter (Signed)
Patient aware of elevated cholesterol and to pick up Rx for atorvastatin from [facility] on [address]. Nat Christen, CMA

## 2017-12-23 ENCOUNTER — Other Ambulatory Visit: Payer: Self-pay

## 2017-12-23 ENCOUNTER — Encounter (INDEPENDENT_AMBULATORY_CARE_PROVIDER_SITE_OTHER): Payer: Self-pay | Admitting: Physician Assistant

## 2017-12-23 ENCOUNTER — Ambulatory Visit (INDEPENDENT_AMBULATORY_CARE_PROVIDER_SITE_OTHER): Payer: Self-pay | Admitting: Physician Assistant

## 2017-12-23 ENCOUNTER — Other Ambulatory Visit (HOSPITAL_COMMUNITY)
Admission: RE | Admit: 2017-12-23 | Discharge: 2017-12-23 | Disposition: A | Discharge status: Home / Self Care | Payer: No Typology Code available for payment source | Source: Ambulatory Visit | Attending: Physician Assistant | Admitting: Physician Assistant

## 2017-12-23 VITALS — BP 126/93 | HR 87 | Temp 98.1°F | Wt 187.4 lb

## 2017-12-23 DIAGNOSIS — Z124 Encounter for screening for malignant neoplasm of cervix: Secondary | ICD-10-CM | POA: Insufficient documentation

## 2017-12-23 DIAGNOSIS — R102 Pelvic and perineal pain: Secondary | ICD-10-CM

## 2017-12-23 NOTE — Progress Notes (Signed)
Subjective:  Patient ID: Janet Ramos, female    DOB: Sep 30, 1961  Age: 57 y.o. MRN: [id number]  CC: PAP  HPI MARILY KONCZAL a 57 y.o.femalewith a medical history of alcoholism, bipolar 1 disorder, seizures, left eye blindness, and trichomoniasis presents for pap smear collection. Endorses occasional right sided pelvic discomfort. Does not endorse vaginal drainage, dyspareunia, or  genital lesions.      Outpatient Medications Prior to Visit  Medication Sig Dispense Refill  . acamprosate (CAMPRAL) 333 MG tablet Take 2 tablets (666 mg total) by mouth 3 (three) times daily with meals. 180 tablet 3  . B Complex-C (B-COMPLEX WITH VITAMIN C) tablet Take 1 tablet by mouth daily. 30 tablet 0  . docusate sodium (COLACE) 50 MG capsule Take 1 capsule (50 mg total) by mouth 2 (two) times daily. 30 capsule 0  . ibuprofen (ADVIL,MOTRIN) 200 MG tablet Take 800 mg by mouth every 6 (six) hours as needed for mild pain or moderate pain.    Marland Kitchen LORazepam (ATIVAN) 2 MG tablet Take one tablet (2 mg) every hour so long as symptoms of delirium tremens are present. Do not exceed 10 mg per day. 25 tablet 0  . Multiple Vitamin (MULTIVITAMIN WITH MINERALS) TABS tablet Take 1 tablet by mouth daily. 30 tablet 0  . risperiDONE (RISPERDAL) 1 MG tablet Take 1 tablet (1 mg total) by mouth at bedtime. 30 tablet 2  . thiamine (VITAMIN B-1) 100 MG tablet Take 1 tablet (100 mg total) by mouth 3 (three) times daily. 42 tablet 0  . atorvastatin (LIPITOR) 40 MG tablet Take 1 tablet (40 mg total) by mouth daily. (Patient not taking: Reported on 12/23/2017) 90 tablet 3  . ferrous sulfate 325 (65 FE) MG EC tablet Take 1 tablet (325 mg total) by mouth 3 (three) times daily with meals. (Patient not taking: Reported on 12/23/2017) 90 tablet 0   No facility-administered medications prior to visit.      ROS Review of Systems  Constitutional: Negative for chills, fever and malaise/fatigue.  Eyes: Negative for blurred vision.   Respiratory: Negative for shortness of breath.   Cardiovascular: Negative for chest pain and palpitations.  Gastrointestinal: Negative for abdominal pain and nausea.  Genitourinary: Negative for dysuria and hematuria.  Musculoskeletal: Negative for joint pain and myalgias.  Skin: Negative for rash.  Neurological: Negative for tingling and headaches.  Psychiatric/Behavioral: Negative for depression. The patient is not nervous/anxious.     Objective:  BP (!) 126/93 (BP Location: Left Arm, Patient Position: Sitting, Cuff Size: Normal)   Pulse 87   Temp 98.1 F (36.7 C) (Oral)   Wt 187 lb 6.4 oz (85 kg)   LMP 03/15/2011   SpO2 97%   BMI 32.17 kg/m   BP/Weight 12/23/2017 11/20/7822 11/17/5359  Systolic BP 443 154 008  Diastolic BP 93 71 84  Wt. (Lbs) 187.4 191 187  BMI 32.17 32.79 32.1      Physical Exam  Constitutional: She is oriented to person, place, and time.  Well developed, well nourished, NAD, polite  HENT:  Head: Normocephalic and atraumatic.  Pulmonary/Chest: Effort normal.  Genitourinary:  Genitourinary Comments: Vagina with discharge. Cervix without motion tenderness. Right sided adnexal tenderness without mass felt. No left sided adnexal tenderness or mass. Uterus mildly tender to palpation.   Musculoskeletal: She exhibits no edema.  Neurological: She is alert and oriented to person, place, and time.  Skin: Skin is warm. No rash noted. No erythema. No pallor.  Psychiatric: She has  a normal mood and affect. Her behavior is normal. Thought content normal.  Vitals reviewed.    Assessment & Plan:    1. Screening for cervical cancer - Cytology - PAP([facility])  2. Adnexal pain - US Pelvis Complete; Future - US Transvaginal Non-OB; Future   Follow-up: Return if symptoms worsen or fail to improve.   Clent Demark PA

## 2017-12-23 NOTE — Patient Instructions (Signed)

## 2017-12-25 ENCOUNTER — Telehealth (INDEPENDENT_AMBULATORY_CARE_PROVIDER_SITE_OTHER): Payer: Self-pay

## 2017-12-25 ENCOUNTER — Other Ambulatory Visit (INDEPENDENT_AMBULATORY_CARE_PROVIDER_SITE_OTHER): Payer: Self-pay | Admitting: Physician Assistant

## 2017-12-25 DIAGNOSIS — N760 Acute vaginitis: Secondary | ICD-10-CM

## 2017-12-25 DIAGNOSIS — B9689 Other specified bacterial agents as the cause of diseases classified elsewhere: Secondary | ICD-10-CM

## 2017-12-25 DIAGNOSIS — A599 Trichomoniasis, unspecified: Secondary | ICD-10-CM

## 2017-12-25 LAB — CYTOLOGY - PAP
BACTERIAL VAGINITIS: POSITIVE — AB
Candida vaginitis: NEGATIVE
Chlamydia: NEGATIVE
DIAGNOSIS: NEGATIVE
NEISSERIA GONORRHEA: NEGATIVE
Trichomonas: POSITIVE — AB

## 2017-12-25 MED ORDER — METRONIDAZOLE 500 MG PO TABS: 500.0000 mg | ORAL_TABLET | Freq: Two times a day (BID) | ORAL | 0 refills | Status: AC

## 2017-12-25 MED FILL — metroNIDAZOLE 500 MG TABS: 500 | 7 days supply | Qty: 14 | Fill #0

## 2017-12-25 NOTE — Telephone Encounter (Signed)
-----   Message from Clent Demark, PA-C sent at 12/25/2017  2:03 PM EDT ----- BV and trichomonas positive. Negative for malignancy. I have sent out metronidazole to [facility]. She also should tell her partner to obtain treatment with his PCP for trichomoniasis or else he can re-infect her.

## 2017-12-25 NOTE — Telephone Encounter (Signed)
Called both numbers on file for patient and received no answer or option to leave message. Nat Christen, CMA

## 2017-12-26 ENCOUNTER — Telehealth (INDEPENDENT_AMBULATORY_CARE_PROVIDER_SITE_OTHER): Payer: Self-pay

## 2017-12-26 NOTE — Telephone Encounter (Signed)
-----   Message from Clent Demark, PA-C sent at 12/25/2017  2:03 PM EDT ----- BV and trichomonas positive. Negative for malignancy. I have sent out metronidazole to [facility]. She also should tell her partner to obtain treatment with his PCP for trichomoniasis or else he can re-infect her.

## 2017-12-26 NOTE — Telephone Encounter (Signed)
Left message asking patient to call the office. Tempestt S Roberts, CMA  

## 2017-12-26 NOTE — Telephone Encounter (Signed)
Patient is aware of BV and trich, pap negative for any malignancy. Pick up metronidazole from [facility] and inform partner(s) of trich so that they may be treated by their PCP to avoid reinfection. Nat Christen, CMA

## 2017-12-26 NOTE — Telephone Encounter (Signed)
-----   Message from Clent Demark, PA-C sent at 12/25/2017  2:03 PM EDT ----- BV and trichomonas positive. Negative for malignancy. I have sent out metronidazole to [facility]. She also should tell her partner to obtain treatment with his PCP for trichomoniasis or else he can re-infect her.

## 2017-12-30 ENCOUNTER — Ambulatory Visit (HOSPITAL_COMMUNITY): Payer: No Typology Code available for payment source

## 2018-01-05 ENCOUNTER — Ambulatory Visit (HOSPITAL_COMMUNITY)
Admission: RE | Admit: 2018-01-05 | Discharge: 2018-01-05 | Disposition: A | Discharge status: Home / Self Care | Payer: No Typology Code available for payment source | Source: Ambulatory Visit | Attending: Physician Assistant | Admitting: Physician Assistant

## 2018-01-05 ENCOUNTER — Other Ambulatory Visit (INDEPENDENT_AMBULATORY_CARE_PROVIDER_SITE_OTHER): Payer: Self-pay | Admitting: Physician Assistant

## 2018-01-05 DIAGNOSIS — R935 Abnormal findings on diagnostic imaging of other abdominal regions, including retroperitoneum: Secondary | ICD-10-CM

## 2018-01-05 DIAGNOSIS — R102 Pelvic and perineal pain: Secondary | ICD-10-CM | POA: Insufficient documentation

## 2018-01-06 ENCOUNTER — Telehealth (INDEPENDENT_AMBULATORY_CARE_PROVIDER_SITE_OTHER): Payer: Self-pay

## 2018-01-06 NOTE — Telephone Encounter (Signed)
Left patient a detailed message regarding [place] results, patient informed via voicemail that US showed abnormally thickened uterine tissue and that PCP has placed referral to [facility] for endometrial biopsy. Asked patient to call office with any questions. Nat Christen, CMA

## 2018-01-06 NOTE — Telephone Encounter (Signed)
-----   Message from Clent Demark, PA-C sent at 01/05/2018  5:53 PM EDT ----- Abnormally thickened Uterine tissue. I will send to [facility] for endometrial biopsy.

## 2018-01-12 ENCOUNTER — Encounter: Payer: Self-pay | Admitting: Obstetrics & Gynecology

## 2018-01-14 ENCOUNTER — Other Ambulatory Visit (INDEPENDENT_AMBULATORY_CARE_PROVIDER_SITE_OTHER): Payer: Self-pay | Admitting: Physician Assistant

## 2018-01-14 DIAGNOSIS — Z1231 Encounter for screening mammogram for malignant neoplasm of breast: Secondary | ICD-10-CM

## 2018-01-22 MED FILL — risperiDONE 1 MG TABS: 1 | 30 days supply | Qty: 30 | Fill #1

## 2018-02-09 ENCOUNTER — Other Ambulatory Visit (HOSPITAL_COMMUNITY)
Admission: RE | Admit: 2018-02-09 | Discharge: 2018-02-09 | Disposition: A | Discharge status: Home / Self Care | Payer: Self-pay | Source: Ambulatory Visit | Attending: Obstetrics & Gynecology | Admitting: Obstetrics & Gynecology

## 2018-02-09 ENCOUNTER — Ambulatory Visit (INDEPENDENT_AMBULATORY_CARE_PROVIDER_SITE_OTHER): Payer: Self-pay | Admitting: Obstetrics & Gynecology

## 2018-02-09 ENCOUNTER — Encounter: Payer: Self-pay | Admitting: Obstetrics & Gynecology

## 2018-02-09 ENCOUNTER — Telehealth (INDEPENDENT_AMBULATORY_CARE_PROVIDER_SITE_OTHER): Payer: Self-pay

## 2018-02-09 DIAGNOSIS — R9389 Abnormal findings on diagnostic imaging of other specified body structures: Secondary | ICD-10-CM | POA: Insufficient documentation

## 2018-02-09 DIAGNOSIS — N840 Polyp of corpus uteri: Secondary | ICD-10-CM | POA: Insufficient documentation

## 2018-02-09 DIAGNOSIS — Z3202 Encounter for pregnancy test, result negative: Secondary | ICD-10-CM

## 2018-02-09 LAB — POCT PREGNANCY, URINE: PREG TEST UR: NEGATIVE

## 2018-02-09 MED ORDER — MEDROXYPROGESTERONE ACETATE 10 MG PO TABS: 20.0000 mg | ORAL_TABLET | Freq: Every day | ORAL | 2 refills | Status: DC

## 2018-02-09 NOTE — Telephone Encounter (Signed)
Patient called stating she recently had a biopsy and is in pain. The ibuprofen does not help with her pain. Inquiring if she can get something stronger. States PCP said he would prescribe tylenol 3 after speaking with provider at [facility]. Please advise. Nat Christen, CMA

## 2018-02-09 NOTE — Telephone Encounter (Signed)
No opioid without office visit/evaluation.

## 2018-02-09 NOTE — Patient Instructions (Signed)

## 2018-02-09 NOTE — Progress Notes (Signed)
Patient ID: Janet Ramos, female   DOB: 1961-08-06, 57 y.o.   MRN: [id number]  Chief Complaint  Patient presents with  . Gynecologic Exam    HPI Janet Ramos is a 57 y.o. female.  [id number] Patient's last menstrual period was 03/15/2011. She is referred for evaluation after a pelvic US showed thickened endometrium. She has had no bleeding for 7 years.. She has brief sharp llq pains associated with activity. HPI  Past Medical History:  Diagnosis Date  . 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  STates this is when she started drinking heaviily  . Bipolar 1 disorder ([place])   . Seizures ([place])   . Trichimoniasis 2010    Past Surgical History:  Procedure Laterality Date  . CESAREAN SECTION  1990  . ENUCLEATION Left 09/2014   Damage from remote that hit her eye  . EYE SURGERY Right 06/2015   cyst removal from medial canthus  . RUPTURED GLOBE EXPLORATION AND REPAIR Left 09/16/2014   Procedure:  Ruptured Globe Repair Left Eye;  Surgeon: Lamonte Sakai, MD;  Location: [place];  Service: Ophthalmology;  Laterality: Left;  . tubaligation  1994    Family History  Problem Relation Age of Onset  . Lupus Mother   . Heart disease Mother   . Colon cancer Father 18  . Thyroid disease Daughter   . Bipolar disorder Daughter   . Hypertension Other   . Cancer Other   . CAD Other     Social History Social History   Tobacco Use  . Smoking status: Current Every Day Smoker    Packs/day: 0.50    Start date: 02/24/1976  . Smokeless tobacco: Never Used  . Tobacco comment: Working on it.  Substance Use Topics  . Alcohol use: Yes    Alcohol/week: 4.8 oz    Types: 8 Standard drinks or equivalent per week    Comment: daily use  . Drug use: Yes    Types: Marijuana    No Known Allergies  Current Outpatient Medications  Medication Sig Dispense Refill  . atorvastatin (LIPITOR) 40 MG tablet Take 1 tablet (40 mg total) by mouth daily. 90 tablet 3  .  B Complex-C (B-COMPLEX WITH VITAMIN C) tablet Take 1 tablet by mouth daily. 30 tablet 0  . docusate sodium (COLACE) 50 MG capsule Take 1 capsule (50 mg total) by mouth 2 (two) times daily. 30 capsule 0  . ferrous sulfate 325 (65 FE) MG EC tablet Take 1 tablet (325 mg total) by mouth 3 (three) times daily with meals. 90 tablet 0  . ibuprofen (ADVIL,MOTRIN) 200 MG tablet Take 800 mg by mouth every 6 (six) hours as needed for mild pain or moderate pain.    Marland Kitchen LORazepam (ATIVAN) 2 MG tablet Take one tablet (2 mg) every hour so long as symptoms of delirium tremens are present. Do not exceed 10 mg per day. 25 tablet 0  . Multiple Vitamin (MULTIVITAMIN WITH MINERALS) TABS tablet Take 1 tablet by mouth daily. 30 tablet 0  . risperiDONE (RISPERDAL) 1 MG tablet Take 1 tablet (1 mg total) by mouth at bedtime. 30 tablet 2  . thiamine (VITAMIN B-1) 100 MG tablet Take 1 tablet (100 mg total) by mouth 3 (three) times daily. 42 tablet 0  . medroxyPROGESTERone (PROVERA) 10 MG tablet Take 2 tablets (20 mg total) by mouth daily. 30 tablet 2   No current facility-administered medications for this visit.  Review of Systems Review of Systems  Constitutional: Negative.   Gastrointestinal: Negative.   Genitourinary: Positive for pelvic pain. Negative for dysuria, frequency, hematuria and vaginal bleeding.    Blood pressure (!) 122/95, pulse 77, weight 189 lb 4.8 oz (85.9 kg), last menstrual period 03/15/2011.  Physical Exam Physical Exam  Constitutional: She appears well-developed and well-nourished.  Pulmonary/Chest: Effort normal.  Abdominal: Soft. She exhibits no distension and no mass. There is no tenderness.  Genitourinary: Vagina normal and uterus normal. No vaginal discharge found.  Psychiatric: She has a normal mood and affect.  Patient given informed consent, signed copy in the chart, time out was performed. Appropriate time out taken. . The patient was placed in the lithotomy position and the cervix  brought into view with sterile speculum.  Portio of cervix cleansed x 2 with betadine swabs.  A tenaculum was placed in the anterior lip of the cervix.  The uterus was sounded for depth of 9 cm. A pipelle was introduced to into the uterus, suction created,  and an endometrial sample was obtained. All equipment was removed and accounted for.  The patient tolerated the procedure well.    Patient given post procedure instructions. The patient will return in 2 weeks for results.  Data Reviewed CLINICAL DATA:  Initial evaluation for chronic right adnexal pain.  EXAM: TRANSABDOMINAL AND TRANSVAGINAL ULTRASOUND OF PELVIS  TECHNIQUE: Both transabdominal and transvaginal ultrasound examinations of the pelvis were performed. Transabdominal technique was performed for global imaging of the pelvis including uterus, ovaries, adnexal regions, and pelvic cul-de-sac. It was necessary to proceed with endovaginal exam following the transabdominal exam to visualize the uterus, endometrium, and ovaries.  COMPARISON:  None  FINDINGS: Uterus  Measurements: 6.5 x 3.4 x 4.0 cm. No fibroids or other mass visualized.  Endometrium  Endometrium is thickened up to 16.2 mm with multiple cystic areas present, largest of which measures 1.0 x 0.7 x 1.2 cm. No associated vascularity.  Right ovary  Not visualized.  No adnexal mass.  Left ovary  Not visualized.  No adnexal mass.  Other findings  No abnormal free fluid.  IMPRESSION: 1. Abnormally thickened multi cystic endometrium measuring up to 16 mm. Endometrial thickness is considered abnormal for an asymptomatic post-menopausal female. Endometrial sampling should be considered to exclude carcinoma. 2. Nonvisualization of the ovaries.  No adnexal mass. 3. No other acute abnormality within the pelvis.   Electronically Signed   By: Jeannine Boga M.D.   On: 01/05/2018 17:48   Assessment    Thickened endometrium, no  bleeding Endometrial biopsy done today    Plan    Financial application  RTC 4 months Will call with result Provera 20 mg daily        Emeterio Reeve 02/09/2018, 10:56 AM

## 2018-02-09 NOTE — Telephone Encounter (Signed)
Patient aware. Tempestt S Roberts, CMA  

## 2018-02-10 ENCOUNTER — Encounter: Payer: Self-pay | Admitting: *Deleted

## 2018-02-17 ENCOUNTER — Telehealth: Payer: Self-pay

## 2018-02-17 NOTE — Telephone Encounter (Signed)
Notified pt that her bx results are normal.  Pt thank you with no further questions.

## 2018-02-17 NOTE — Telephone Encounter (Signed)
-----   Message from Woodroe Mode, MD sent at 02/13/2018  8:22 AM EDT ----- Benign result

## 2018-02-20 ENCOUNTER — Telehealth (INDEPENDENT_AMBULATORY_CARE_PROVIDER_SITE_OTHER): Payer: Self-pay

## 2018-02-20 NOTE — Telephone Encounter (Signed)
Patient called requesting a refill of her risperidone sent to [facility]. Nat Christen, CMA

## 2018-02-23 ENCOUNTER — Other Ambulatory Visit: Payer: Self-pay | Admitting: Nurse Practitioner

## 2018-02-23 DIAGNOSIS — F3162 Bipolar disorder, current episode mixed, moderate: Secondary | ICD-10-CM

## 2018-02-23 MED ORDER — RISPERIDONE 1 MG PO TABS: 1.0000 mg | ORAL_TABLET | Freq: Every day | ORAL | 2 refills | Status: DC

## 2018-02-23 NOTE — Telephone Encounter (Signed)
Script has been sent.

## 2018-03-11 ENCOUNTER — Encounter (HOSPITAL_COMMUNITY): Payer: Self-pay

## 2018-03-11 ENCOUNTER — Other Ambulatory Visit: Payer: Self-pay

## 2018-03-11 ENCOUNTER — Emergency Department (HOSPITAL_COMMUNITY): Payer: Self-pay

## 2018-03-11 ENCOUNTER — Emergency Department (HOSPITAL_COMMUNITY)
Admission: EM | Admit: 2018-03-11 | Discharge: 2018-03-12 | Disposition: A | Discharge status: Home / Self Care | Payer: Self-pay | Attending: Emergency Medicine | Admitting: Emergency Medicine

## 2018-03-11 DIAGNOSIS — Z79899 Other long term (current) drug therapy: Secondary | ICD-10-CM | POA: Insufficient documentation

## 2018-03-11 DIAGNOSIS — R0789 Other chest pain: Secondary | ICD-10-CM | POA: Insufficient documentation

## 2018-03-11 DIAGNOSIS — F17200 Nicotine dependence, unspecified, uncomplicated: Secondary | ICD-10-CM | POA: Insufficient documentation

## 2018-03-11 DIAGNOSIS — M542 Cervicalgia: Secondary | ICD-10-CM | POA: Insufficient documentation

## 2018-03-11 MED ORDER — DEXAMETHASONE SODIUM PHOSPHATE 10 MG/ML IJ SOLN
10.0000 mg | Freq: Once | INTRAMUSCULAR | Status: AC
  Administered 2018-03-11: 10 mg via INTRAMUSCULAR
  Filled 2018-03-11: qty 1

## 2018-03-11 MED ORDER — TRAMADOL HCL 50 MG PO TABS
50.0000 mg | ORAL_TABLET | Freq: Once | ORAL | Status: AC
  Administered 2018-03-12: 50 mg via ORAL
  Filled 2018-03-11: qty 1

## 2018-03-11 MED ORDER — KETOROLAC TROMETHAMINE 30 MG/ML IJ SOLN
15.0000 mg | Freq: Once | INTRAMUSCULAR | Status: AC
  Administered 2018-03-11: 15 mg via INTRAMUSCULAR
  Filled 2018-03-11: qty 1

## 2018-03-11 NOTE — ED Triage Notes (Signed)
Per EMS- patient c/o neck stiffness and pain yesterday when she woke and is now having pain that radiates into the upper back and upper chest wall pain. Patient  States worse with movement. Patient denies any injuries or heavy lifting.

## 2018-03-11 NOTE — ED Provider Notes (Signed)
[facility] DEPT Provider Note   CSN: [id number] Arrival date & time: 03/11/18  1741     History   Chief Complaint Chief Complaint  Patient presents with  . Neck Pain  . Back Pain  . chest wall pain    HPI Janet Ramos is a 57 y.o. female.  HPI Presents with concern of pain in her neck, upper chest, left arm. Pain began about 2 days ago, since onset is been persistent. Pain is initially in her neck, bilaterally, but began to radiate in the other areas over the past few days. There is no dyspnea, no difficulty swallowing or speaking, no fever, no chills, no cough. Patient notes the pain is worse with palpation of the neck. She is a smoker, drinks 2 x 40 ounce beers daily.  Since onset no relief with ibuprofen.   Past Medical History:  Diagnosis Date  . 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  STates this is when she started drinking heaviily  . Bipolar 1 disorder ([place])   . Seizures ([place])   . Trichimoniasis 2010    Patient Active Problem List   Diagnosis Date Noted  . Thickened endometrium 02/09/2018  . Substance induced mood disorder ([place]) 10/31/2017  . Left knee pain 08/28/2015  . Headache 05/08/2015  . Hyperlipidemia 05/08/2015  . Hypokalemia 05/08/2015  . Right sided weakness 05/07/2015  . Syncope 05/07/2015  . Polysubstance abuse ([place]) 05/07/2015  . 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 CESAREAN SECTION  1990  . ENUCLEATION Left 09/2014   Damage from remote that hit her eye  . EYE SURGERY Right 06/2015   cyst removal from medial canthus  . RUPTURED GLOBE EXPLORATION AND REPAIR Left 09/16/2014   Procedure:  Ruptured Globe Repair Left Eye;  Surgeon: Lamonte Sakai, MD;  Location: [facility];  Service: Ophthalmology;  Laterality: Left;  . tubaligation  1994     OB History    Gravida  6   Para  6   Term  6   Preterm      AB     Living  6     SAB      TAB      Ectopic      Multiple      Live Births  6            Home Medications    Prior to Admission medications   Medication Sig Start Date End Date Taking? Authorizing Provider  acamprosate (CAMPRAL) 333 MG tablet Take 666 mg by mouth 3 (three) times daily. 01/13/18  Yes [provider]  atorvastatin (LIPITOR) 40 MG tablet Take 1 tablet (40 mg total) by mouth daily. 12/16/17  Yes Clent Demark, PA-C  B Complex-C (B-COMPLEX WITH VITAMIN C) tablet Take 1 tablet by mouth daily. 11/19/17  Yes Clent Demark, PA-C  ferrous sulfate 325 (65 FE) MG EC tablet Take 1 tablet (325 mg total) by mouth 3 (three) times daily with meals. 12/12/17  Yes Clent Demark, PA-C  ibuprofen (ADVIL,MOTRIN) 200 MG tablet Take 800 mg by mouth every 6 (six) hours as needed for mild pain or moderate pain.   Yes [provider]  LORazepam (ATIVAN) 2 MG tablet Take one tablet (2 mg) every hour so long as symptoms of delirium tremens are present. Do not exceed 10 mg per day. Patient taking  differently: Take 2 mg by mouth See admin instructions. Take one tablet (2 mg) every hour so long as symptoms of delirium tremens are present. Do not exceed 10 mg per day. 11/19/17  Yes Clent Demark, PA-C  medroxyPROGESTERone (PROVERA) 10 MG tablet Take 2 tablets (20 mg total) by mouth daily. 02/09/18  Yes Woodroe Mode, MD  Multiple Vitamin (MULTIVITAMIN WITH MINERALS) TABS tablet Take 1 tablet by mouth daily. 05/09/15  Yes Vann, Jessica U, DO  risperiDONE (RISPERDAL) 1 MG tablet Take 1 tablet (1 mg total) by mouth at bedtime. 02/23/18  Yes Gildardo Pounds, NP  thiamine (VITAMIN B-1) 100 MG tablet Take 1 tablet (100 mg total) by mouth 3 (three) times daily. 11/19/17  Yes Clent Demark, PA-C  docusate sodium (COLACE) 50 MG capsule Take 1 capsule (50 mg total) by mouth 2 (two) times daily. Patient not taking: Reported on 03/11/2018 12/12/17   Clent Demark, PA-C     Family History Family History  Problem Relation Age of Onset  . Lupus Mother   . Heart disease Mother   . Colon cancer Father 79  . Thyroid disease Daughter   . Bipolar disorder Daughter   . Hypertension Other   . Cancer Other   . CAD Other     Social History Social History   Tobacco Use  . Smoking status: Current Every Day Smoker    Packs/day: 0.50    Start date: 02/24/1976  . Smokeless tobacco: Never Used  . Tobacco comment: Working on it.  Substance Use Topics  . Alcohol use: Yes    Alcohol/week: 4.8 oz    Types: 8 Standard drinks or equivalent per week    Comment: daily use  . Drug use: Yes    Types: Marijuana     Allergies   Patient has no known allergies.   Review of Systems Review of Systems   Physical Exam Updated Vital Signs BP (!) 154/110 (BP Location: Right Arm)   Pulse 81   Temp (!) 97.4 F (36.3 C) (Oral)   Resp 20   Ht 5\' 4"  (1.626 m)   Wt 86.2 kg (190 lb)   LMP 03/15/2011   SpO2 98%   BMI 32.61 kg/m   Physical Exam  Constitutional: She is oriented to person, place, and time. She appears well-developed and well-nourished. No distress.  HENT:  Head: Normocephalic and atraumatic.  Eyes: Conjunctivae and EOM are normal.  Neck:    Cardiovascular: Normal rate and regular rhythm.  Pulmonary/Chest: Effort normal and breath sounds normal. No stridor. No respiratory distress.  Abdominal: She exhibits no distension.  Musculoskeletal: She exhibits no edema.  Neurological: She is alert and oriented to person, place, and time. No cranial nerve deficit.  Skin: Skin is warm and dry.  Psychiatric: She has a normal mood and affect.  Nursing note and vitals reviewed.    ED Treatments / Results   EKG EKG Interpretation  Date/Time:  Wednesday Mar 11 2018 23:00:12 EDT Ventricular Rate:  80 PR Interval:    QRS Duration: 90 QT Interval:  392 QTC Calculation: 453 R Axis:   -18 Text Interpretation:  Sinus rhythm Borderline left axis  deviation No significant change since last tracing Abnormal ekg Confirmed by Carmin Muskrat [phone]) on 03/11/2018 11:59:53 PM   Radiology Dg Chest Port 1 View  Result Date: 03/11/2018 CLINICAL DATA:  Acute onset of generalized chest pain. EXAM: PORTABLE CHEST 1 VIEW COMPARISON:  Chest radiograph performed 07/01/2017 FINDINGS: The lungs  are well-aerated. Minimal left basilar atelectasis is noted. There is no evidence of pleural effusion or pneumothorax. The cardiomediastinal silhouette is within normal limits. No acute osseous abnormalities are seen. IMPRESSION: Minimal left basilar atelectasis noted.  Lungs otherwise clear. Electronically Signed   By: Garald Balding M.D.   On: 03/11/2018 23:37    Procedures Procedures (including critical care time)  Medications Ordered in ED Medications  traMADol (ULTRAM) tablet 50 mg (has no administration in time range)  ketorolac (TORADOL) 30 MG/ML injection 15 mg (15 mg Intramuscular Given 03/11/18 2301)  dexamethasone (DECADRON) injection 10 mg (10 mg Intramuscular Given 03/11/18 2301)     Initial Impression / Assessment and Plan / ED Course  I have reviewed the triage vital signs and the nursing notes.  Pertinent labs & imaging results that were available during my care of the patient were reviewed by me and considered in my medical decision making (see chart for details).     11:59 PM On repeat exam the patient is awake and alert, states that she is feeling somewhat better.  When she continues to have no other complaints, no evidence for respiratory compromise. Patient does have mild hypertension, but otherwise vitals are unremarkable. We discussed all findings, and given the days long symptoms, nonischemic EKG, reassuring chest x-ray, low suspicion for acute new pathology, atypical ACS. Point tenderness suggest inflammatory condition, the patient will start steroids, anti-inflammatories, follow-up with primary care.   Final Clinical  Impressions(s) / ED Diagnoses  Neck pain    Carmin Muskrat, MD 03/12/18 0005

## 2018-03-11 NOTE — ED Notes (Signed)
EKG given to EDP,Lockwood,MD., for review. 

## 2018-03-12 MED ORDER — PREDNISONE 20 MG PO TABS: 40.0000 mg | ORAL_TABLET | Freq: Every day | ORAL | 0 refills | Status: DC

## 2018-03-12 MED ORDER — TRAMADOL HCL 50 MG PO TABS: 50.0000 mg | ORAL_TABLET | Freq: Four times a day (QID) | ORAL | 0 refills | Status: DC | PRN

## 2018-03-12 NOTE — Discharge Instructions (Signed)
As discussed, your evaluation today has been largely reassuring.  But, it is important that you monitor your condition carefully, and do not hesitate to return to the ED if you develop new, or concerning changes in your condition. ? ?Otherwise, please follow-up with your physician for appropriate ongoing care. ? ?

## 2018-04-07 ENCOUNTER — Encounter (INDEPENDENT_AMBULATORY_CARE_PROVIDER_SITE_OTHER): Payer: Self-pay | Admitting: Physician Assistant

## 2018-04-07 ENCOUNTER — Other Ambulatory Visit: Payer: Self-pay

## 2018-04-07 ENCOUNTER — Ambulatory Visit (INDEPENDENT_AMBULATORY_CARE_PROVIDER_SITE_OTHER): Payer: Self-pay | Admitting: Physician Assistant

## 2018-04-07 VITALS — BP 138/99 | HR 70 | Temp 97.5°F | Ht 64.0 in | Wt 188.0 lb

## 2018-04-07 DIAGNOSIS — Z760 Encounter for issue of repeat prescription: Secondary | ICD-10-CM

## 2018-04-07 DIAGNOSIS — N898 Other specified noninflammatory disorders of vagina: Secondary | ICD-10-CM

## 2018-04-07 DIAGNOSIS — Z1211 Encounter for screening for malignant neoplasm of colon: Secondary | ICD-10-CM

## 2018-04-07 MED ORDER — ATORVASTATIN CALCIUM 40 MG PO TABS: 40.0000 mg | ORAL_TABLET | Freq: Every day | ORAL | 11 refills | Status: DC

## 2018-04-07 MED ORDER — FLUCONAZOLE 150 MG PO TABS: 150.0000 mg | ORAL_TABLET | ORAL | 0 refills | Status: DC

## 2018-04-07 MED ORDER — RISPERIDONE 1 MG PO TABS: 1.0000 mg | ORAL_TABLET | Freq: Every day | ORAL | 5 refills | Status: DC

## 2018-04-07 MED ORDER — ACAMPROSATE CALCIUM 333 MG PO TBEC: 666.0000 mg | DELAYED_RELEASE_TABLET | Freq: Three times a day (TID) | ORAL | 2 refills | Status: DC

## 2018-04-07 MED ORDER — METRONIDAZOLE 500 MG PO TABS: 500.0000 mg | ORAL_TABLET | Freq: Two times a day (BID) | ORAL | 0 refills | Status: AC

## 2018-04-07 MED ORDER — VITAMIN B-1 100 MG PO TABS: 100.0000 mg | ORAL_TABLET | Freq: Three times a day (TID) | ORAL | 0 refills | Status: DC

## 2018-04-07 MED FILL — FLUCONAZOLE 150 MG TABS: 150 | 2 days supply | Qty: 2 | Fill #0

## 2018-04-07 MED FILL — ACAMPROSATE CALC DR 333 MG: 333 | 30 days supply | Qty: 180 | Fill #0

## 2018-04-07 MED FILL — metroNIDAZOLE 500 MG TABS: 500 | 7 days supply | Qty: 14 | Fill #0

## 2018-04-07 MED FILL — risperiDONE 1 MG TABS: 1 | 30 days supply | Qty: 30 | Fill #0

## 2018-04-07 MED FILL — ATORVASTATIN CALCIUM 40 MG: 40 | 30 days supply | Qty: 30 | Fill #0

## 2018-04-07 NOTE — Progress Notes (Signed)
Subjective:  Patient ID: Janet Ramos, female    DOB: 1960-10-16  Age: 57 y.o. MRN: [id number]  CC: vaginal discharge  HPI Janet Ramos a 57 y.o.femalewith a medical history of alcoholism, bipolar 1 disorder, seizures, left eye blindness, and trichomoniasis presentswith vaginal discharge described as white and itchy. Onset approximately 2-4 weeks. Not associated to sex because, "I don't have any". No odor, no bleeding, no pelvic pain, no f/c/n/v, no dysuria.       Outpatient Medications Prior to Visit  Medication Sig Dispense Refill  . acamprosate (CAMPRAL) 333 MG tablet Take 666 mg by mouth 3 (three) times daily.  3  . atorvastatin (LIPITOR) 40 MG tablet Take 1 tablet (40 mg total) by mouth daily. (Patient not taking: Reported on 04/07/2018) 90 tablet 3  . B Complex-C (B-COMPLEX WITH VITAMIN C) tablet Take 1 tablet by mouth daily. (Patient not taking: Reported on 04/07/2018) 30 tablet 0  . docusate sodium (COLACE) 50 MG capsule Take 1 capsule (50 mg total) by mouth 2 (two) times daily. (Patient not taking: Reported on 03/11/2018) 30 capsule 0  . ferrous sulfate 325 (65 FE) MG EC tablet Take 1 tablet (325 mg total) by mouth 3 (three) times daily with meals. (Patient not taking: Reported on 04/07/2018) 90 tablet 0  . ibuprofen (ADVIL,MOTRIN) 200 MG tablet Take 800 mg by mouth every 6 (six) hours as needed for mild pain or moderate pain.    Marland Kitchen LORazepam (ATIVAN) 2 MG tablet Take one tablet (2 mg) every hour so long as symptoms of delirium tremens are present. Do not exceed 10 mg per day. (Patient not taking: Reported on 04/07/2018) 25 tablet 0  . medroxyPROGESTERone (PROVERA) 10 MG tablet Take 2 tablets (20 mg total) by mouth daily. (Patient not taking: Reported on 04/07/2018) 30 tablet 2  . Multiple Vitamin (MULTIVITAMIN WITH MINERALS) TABS tablet Take 1 tablet by mouth daily. (Patient not taking: Reported on 04/07/2018) 30 tablet 0  . predniSONE (DELTASONE) 20 MG tablet Take 2 tablets (40 mg  total) by mouth daily with breakfast. For the next four days (Patient not taking: Reported on 04/07/2018) 8 tablet 0  . risperiDONE (RISPERDAL) 1 MG tablet Take 1 tablet (1 mg total) by mouth at bedtime. (Patient not taking: Reported on 04/07/2018) 30 tablet 2  . thiamine (VITAMIN B-1) 100 MG tablet Take 1 tablet (100 mg total) by mouth 3 (three) times daily. (Patient not taking: Reported on 04/07/2018) 42 tablet 0  . traMADol (ULTRAM) 50 MG tablet Take 1 tablet (50 mg total) by mouth every 6 (six) hours as needed. (Patient not taking: Reported on 04/07/2018) 15 tablet 0   No facility-administered medications prior to visit.      ROS Review of Systems  Constitutional: Negative for chills, fever and malaise/fatigue.  Eyes: Negative for blurred vision.  Respiratory: Negative for shortness of breath.   Cardiovascular: Negative for chest pain and palpitations.  Gastrointestinal: Negative for abdominal pain and nausea.  Genitourinary: Negative for dysuria, flank pain, frequency, hematuria and urgency.       Vaginal discharge  Musculoskeletal: Negative for joint pain and myalgias.  Skin: Negative for rash.  Neurological: Negative for tingling and headaches.  Psychiatric/Behavioral: Negative for depression. The patient is not nervous/anxious.     Objective:  BP (!) 138/99 (BP Location: Left Arm, Patient Position: Sitting, Cuff Size: Normal)   Pulse 70   Temp (!) 97.5 F (36.4 C) (Oral)   Ht 5\' 4"  (1.626 m)  Wt 188 lb (85.3 kg)   LMP 03/15/2011   SpO2 97%   BMI 32.27 kg/m   BP/Weight 04/07/2018 03/12/2018 1/96/2229  Systolic BP 798 921 -  Diastolic BP 99 194 -  Wt. (Lbs) 188 - 190  BMI 32.27 - 32.61      Physical Exam  Constitutional: She is oriented to person, place, and time.  Well developed, well nourished, NAD, polite  HENT:  Head: Normocephalic and atraumatic.  Eyes: No scleral icterus.  Neck: Normal range of motion. Neck supple. No thyromegaly present.  Pulmonary/Chest:  Effort normal.  Abdominal: Soft. Bowel sounds are normal. There is no tenderness.  Genitourinary:  Genitourinary Comments: Atrophic vagina, scant thick white discharge, no bleeding.  Musculoskeletal: She exhibits no edema.  Neurological: She is alert and oriented to person, place, and time.  Skin: Skin is warm and dry. No rash noted. No erythema. No pallor.  Psychiatric: She has a normal mood and affect. Her behavior is normal. Thought content normal.  Vitals reviewed.    Assessment & Plan:   1. Vaginal discharge - metroNIDAZOLE (FLAGYL) 500 MG tablet; Take 1 tablet (500 mg total) by mouth 2 (two) times daily for 7 days.  Dispense: 14 tablet; Refill: 0 - fluconazole (DIFLUCAN) 150 MG tablet; Take 1 tablet (150 mg total) by mouth once a week.  Dispense: 2 tablet; Refill: 0  2. Medication refill - risperiDONE (RISPERDAL) 1 MG tablet; Take 1 tablet (1 mg total) by mouth at bedtime.  Dispense: 30 tablet; Refill: 5 - atorvastatin (LIPITOR) 40 MG tablet; Take 1 tablet (40 mg total) by mouth daily.  Dispense: 30 tablet; Refill: 11 - acamprosate (CAMPRAL) 333 MG tablet; Take 2 tablets (666 mg total) by mouth 3 (three) times daily.  Dispense: 180 tablet; Refill: 2 - thiamine (VITAMIN B-1) 100 MG tablet; Take 1 tablet (100 mg total) by mouth 3 (three) times daily.  Dispense: 42 tablet; Refill: 0  3. Screening for colon cancer - Fecal occult blood, imunochemical; Future   Meds ordered this encounter  Medications  . metroNIDAZOLE (FLAGYL) 500 MG tablet    Sig: Take 1 tablet (500 mg total) by mouth 2 (two) times daily for 7 days.    Dispense:  14 tablet    Refill:  0    Order Specific Question:   Supervising Provider    Answer:   Charlott Rakes [[id number]]  . fluconazole (DIFLUCAN) 150 MG tablet    Sig: Take 1 tablet (150 mg total) by mouth once a week.    Dispense:  2 tablet    Refill:  0    Order Specific Question:   Supervising Provider    Answer:   Charlott Rakes [[id number]]  . risperiDONE  (RISPERDAL) 1 MG tablet    Sig: Take 1 tablet (1 mg total) by mouth at bedtime.    Dispense:  30 tablet    Refill:  5    Order Specific Question:   Supervising Provider    Answer:   Charlott Rakes [[id number]]  . atorvastatin (LIPITOR) 40 MG tablet    Sig: Take 1 tablet (40 mg total) by mouth daily.    Dispense:  30 tablet    Refill:  11    Order Specific Question:   Supervising Provider    Answer:   Charlott Rakes [[id number]]  . acamprosate (CAMPRAL) 333 MG tablet    Sig: Take 2 tablets (666 mg total) by mouth 3 (three) times daily.    Dispense:  180 tablet  Refill:  2    Order Specific Question:   Supervising Provider    Answer:   Charlott Rakes [[id number]]  . thiamine (VITAMIN B-1) 100 MG tablet    Sig: Take 1 tablet (100 mg total) by mouth 3 (three) times daily.    Dispense:  42 tablet    Refill:  0    Order Specific Question:   Supervising Provider    Answer:   Charlott Rakes [[id number]]    Follow-up: Return in about 1 month (around 05/05/2018) for alcoholism.   Clent Demark PA

## 2018-04-07 NOTE — Patient Instructions (Addendum)
Vaginitis Vaginitis is an inflammation of the vagina. It can happen when the normal bacteria and yeast in the vagina grow too much. There are different types. Treatment will depend on the type you have. Follow these instructions at home:  Take all medicines as told by your doctor.  Keep your vagina area clean and dry. Avoid soap. Rinse the area with water.  Avoid washing and cleaning out the vagina (douching).  Do not use tampons or have sex (intercourse) until your treatment is done.  Wipe from front to back after going to the restroom.  Wear cotton underwear.  Avoid wearing underwear while you sleep until your vaginitis is gone.  Avoid tight pants. Avoid underwear or nylons without a cotton panel.  Take off wet clothing (such as a bathing suit) as soon as you can.  Use mild, unscented products. Avoid fabric softeners and scented: ? Feminine sprays. ? Laundry detergents. ? Tampons. ? Soaps or bubble baths.  Practice safe sex and use condoms. Get help right away if:  You have belly (abdominal) pain.  You have a fever or lasting symptoms for more than 2-3 days.  You have a fever and your symptoms suddenly get worse. This information is not intended to replace advice given to you by your health care provider. Make sure you discuss any questions you have with your health care provider. Document Released: 12/27/2008 Document Revised: 03/07/2016 Document Reviewed: 03/12/2012 Elsevier Interactive Patient Education  2017 [employer].   [employer]  Advocacy/Legal Legal Aid [place]:  [phone]  /  [phone]  [place]:  [phone]  Family Service of the [facility] 24-hr Crisis line:  [phone]  [facility], [place]:  [phone]  [place] (custody):  [phone]  [facility]:   [phone]    Baby & Breastfeeding Car Seat Inspection @ [place].- call [place] Lactation  [phone]    [place] Lactation [phone]  [place]: [phone] ([place]);  [phone] ([place])  [place] League:  [phone]   [place] Child Development: [phone] [facility]) / [phone] (HP)  - Child Care Resources/ Referrals/ Scholarships  - Head Start/ Early Head Start (call or apply online)  [place] DHHS: [place] Pre-K :  [phone] / [phone]   Employment / [place]: [phone] / [place]  [place] ([place]): [phone] ([place]) / [phone] ([place])  [place]: [phone] / [phone]  [facility]: [phone]  DHHS Work First: [phone] ([place]) / [phone] ([place])  [place]:  [place]:  [phone]  [employer]: [place] (furniture):  [place] Helping Hands: [phone]  [place]  [place]- SNAP/ Food Stamps: [phone]  [place]: Letta [phone] ;  [place] [phone]  [place]  During the summer, text "FOOD" to [place] / Clinics (Adults) [place] (for Adults) through [facility] [place]: [phone]  [place]:   [place]:   [phone]  Health Department:  [place]:  [phone] / [phone]  Planned Parenthood of [place]:   [phone]  [facility]:   [phone] x [place]:   [place]:  [phone]  Affordable Housing Managemnt:  [phone]   [place] for [facility] [place]):  [phone]  [employer] [facility]:  [place]:   [place]:  [place]:  [place]  www.youthsafegso.org  PFLAG  660-600-4599 / info@[place] .org  [place]:  [phone]   Mental Health/ Substance Use Family Service of the [place]  [place]:  [phone] or [phone]  [facility] of Care:  [phone]  Journeys Counseling:  [place]:  [phone]  [phone] (walk-ins)  [phone] / [place]:  [address]  Alcoholics Anonymous:  530-051-1021  Narcotics Anonymous:  507-379-1572  Quit Smoking Hotline:  800-QUIT-NOW 4144123926)   Parenting [place]:  [place]:  [phone]  [employer]WCA: [phone]  [employer]: Bringing Out the Best:  [phone]               Thriving at Three (Hispanic families): [phone]  Healthy Start ([place]):  [phone]  Parents as Teachers:  [place] Together (Immigrants): [phone]   Poison Control 7607162876  [place] Open Doors Application: [address]  [place] of [place]: http://www.[place].gov/index.aspx?page=3615   Special Needs Family Support Network:  (786) [phone]  [place] of [place]:   [place] or [phone] /  [place]:  [phone]  [place]:  [place] (CDSA):  [phone]  [place] (Care Coordination for Children):  [phone]   Transportation Medicaid Transportation: 639-344-0492 to apply  [place]: [phone] (reduced-fare bus ID to [place])  SCAT Paratransit services: Eligible riders only, call [address] for application   Tutoring/Mentoring Cashiers: [place]: [phone] ([place])  ACES through child's school: [place]: contact your local [place] Program: [phone]

## 2018-04-20 ENCOUNTER — Emergency Department (HOSPITAL_COMMUNITY)
Admission: EM | Admit: 2018-04-20 | Discharge: 2018-04-21 | Disposition: A | Discharge status: Home / Self Care | Payer: Self-pay | Attending: Emergency Medicine | Admitting: Emergency Medicine

## 2018-04-20 ENCOUNTER — Other Ambulatory Visit: Payer: Self-pay

## 2018-04-20 ENCOUNTER — Emergency Department (HOSPITAL_COMMUNITY): Payer: Self-pay

## 2018-04-20 ENCOUNTER — Encounter (HOSPITAL_COMMUNITY): Payer: Self-pay | Admitting: Emergency Medicine

## 2018-04-20 DIAGNOSIS — F17200 Nicotine dependence, unspecified, uncomplicated: Secondary | ICD-10-CM | POA: Insufficient documentation

## 2018-04-20 DIAGNOSIS — N730 Acute parametritis and pelvic cellulitis: Secondary | ICD-10-CM | POA: Insufficient documentation

## 2018-04-20 DIAGNOSIS — R109 Unspecified abdominal pain: Secondary | ICD-10-CM | POA: Insufficient documentation

## 2018-04-20 DIAGNOSIS — Z79899 Other long term (current) drug therapy: Secondary | ICD-10-CM | POA: Insufficient documentation

## 2018-04-20 DIAGNOSIS — M545 Low back pain: Secondary | ICD-10-CM | POA: Insufficient documentation

## 2018-04-20 DIAGNOSIS — M79606 Pain in leg, unspecified: Secondary | ICD-10-CM | POA: Insufficient documentation

## 2018-04-20 LAB — COMPREHENSIVE METABOLIC PANEL
ALBUMIN: 3.7 g/dL (ref 3.5–5.0)
ALT: 21 U/L (ref 0–44)
AST: 23 U/L (ref 15–41)
Alkaline Phosphatase: 68 U/L (ref 38–126)
Anion gap: 9 (ref 5–15)
BUN: 9 mg/dL (ref 6–20)
CHLORIDE: 107 mmol/L (ref 98–111)
CO2: 24 mmol/L (ref 22–32)
CREATININE: 0.7 mg/dL (ref 0.44–1.00)
Calcium: 9.6 mg/dL (ref 8.9–10.3)
GFR calc Af Amer: >60 mL/min (ref >60)
GLUCOSE: 92 mg/dL (ref 70–99)
POTASSIUM: 3.8 mmol/L (ref 3.5–5.1)
Sodium: 140 mmol/L (ref 135–145)
Total Bilirubin: 0.9 mg/dL (ref 0.3–1.2)
Total Protein: 7.4 g/dL (ref 6.5–8.1)

## 2018-04-20 LAB — CBC WITH DIFFERENTIAL/PLATELET
Basophils Absolute: 0 10*3/uL (ref 0.0–0.1)
Basophils Relative: 0 %
EOS PCT: 2 %
Eosinophils Absolute: 0.1 10*3/uL (ref 0.0–0.7)
HCT: 38.5 % (ref 36.0–46.0)
Hemoglobin: 14 g/dL (ref 12.0–15.0)
LYMPHS ABS: 3.3 10*3/uL (ref 0.7–4.0)
LYMPHS PCT: 51 %
MCH: 33.2 pg (ref 26.0–34.0)
MCHC: 36.4 g/dL — AB (ref 30.0–36.0)
MCV: 91.2 fL (ref 78.0–100.0)
MONO ABS: 0.5 10*3/uL (ref 0.1–1.0)
MONOS PCT: 7 %
Neutro Abs: 2.7 10*3/uL (ref 1.7–7.7)
Neutrophils Relative %: 40 %
PLATELETS: 150 10*3/uL (ref 150–400)
RBC: 4.22 MIL/uL (ref 3.87–5.11)
RDW: 13.7 % (ref 11.5–15.5)
WBC: 6.6 10*3/uL (ref 4.0–10.5)

## 2018-04-20 LAB — URINALYSIS, ROUTINE W REFLEX MICROSCOPIC
Bilirubin Urine: NEGATIVE
GLUCOSE, UA: NEGATIVE mg/dL
Ketones, ur: NEGATIVE mg/dL
Leukocytes, UA: NEGATIVE
Nitrite: NEGATIVE
PH: 5 (ref 5.0–8.0)
PROTEIN: NEGATIVE mg/dL
Specific Gravity, Urine: 1.004 — ABNORMAL LOW (ref 1.005–1.030)

## 2018-04-20 LAB — WET PREP, GENITAL
Clue Cells Wet Prep HPF POC: NONE SEEN
Sperm: NONE SEEN
Trich, Wet Prep: NONE SEEN
YEAST WET PREP: NONE SEEN

## 2018-04-20 MED ORDER — OXYCODONE-ACETAMINOPHEN 5-325 MG PO TABS
1.0000 | ORAL_TABLET | Freq: Once | ORAL | Status: AC
  Administered 2018-04-20: 1 via ORAL
  Filled 2018-04-20: qty 1

## 2018-04-20 MED ORDER — SODIUM CHLORIDE 0.9 % IV BOLUS
1000.0000 mL | Freq: Once | INTRAVENOUS | Status: AC
  Administered 2018-04-20: 1000 mL via INTRAVENOUS

## 2018-04-20 MED ORDER — SODIUM CHLORIDE 0.9 % IV SOLN
INTRAVENOUS | Status: DC
  Administered 2018-04-20: 17:00:00 via INTRAVENOUS

## 2018-04-20 MED ORDER — DOXYCYCLINE HYCLATE 100 MG PO CAPS: 100.0000 mg | ORAL_CAPSULE | Freq: Two times a day (BID) | ORAL | 0 refills | Status: DC

## 2018-04-20 MED ORDER — HYDROMORPHONE HCL 1 MG/ML IJ SOLN
1.0000 mg | Freq: Once | INTRAMUSCULAR | Status: AC
  Administered 2018-04-20: 1 mg via INTRAVENOUS
  Filled 2018-04-20: qty 1

## 2018-04-20 MED ORDER — ONDANSETRON HCL 4 MG/2ML IJ SOLN
4.0000 mg | Freq: Once | INTRAMUSCULAR | Status: AC
  Administered 2018-04-20: 4 mg via INTRAVENOUS
  Filled 2018-04-20: qty 2

## 2018-04-20 MED ORDER — SODIUM CHLORIDE 0.9 % IV SOLN
1.0000 g | Freq: Once | INTRAVENOUS | Status: AC
  Administered 2018-04-20: 1 g via INTRAVENOUS
  Filled 2018-04-20: qty 10

## 2018-04-20 MED ORDER — IOPAMIDOL (ISOVUE-300) INJECTION 61%
100.0000 mL | Freq: Once | INTRAVENOUS | Status: AC | PRN
  Administered 2018-04-20: 100 mL via INTRAVENOUS

## 2018-04-20 MED ORDER — METRONIDAZOLE 500 MG PO TABS: 500.0000 mg | ORAL_TABLET | Freq: Two times a day (BID) | ORAL | 0 refills | Status: DC

## 2018-04-20 MED ORDER — METRONIDAZOLE 500 MG PO TABS
500.0000 mg | ORAL_TABLET | Freq: Once | ORAL | Status: AC
  Administered 2018-04-20: 500 mg via ORAL
  Filled 2018-04-20: qty 1

## 2018-04-20 MED ORDER — DIPHENHYDRAMINE HCL 50 MG/ML IJ SOLN
12.5000 mg | Freq: Once | INTRAMUSCULAR | Status: AC
Start: 2018-04-20 — End: 2018-04-20
  Administered 2018-04-20: 12.5 mg via INTRAVENOUS
  Filled 2018-04-20: qty 1

## 2018-04-20 MED ORDER — IOPAMIDOL (ISOVUE-300) INJECTION 61%
INTRAVENOUS | Status: AC
  Filled 2018-04-20: qty 100

## 2018-04-20 MED ORDER — HYDROCODONE-ACETAMINOPHEN 5-325 MG PO TABS: 2.0000 | ORAL_TABLET | ORAL | 0 refills | Status: DC | PRN

## 2018-04-20 MED ORDER — DOXYCYCLINE HYCLATE 100 MG PO TABS
100.0000 mg | ORAL_TABLET | Freq: Once | ORAL | Status: AC
  Administered 2018-04-20: 100 mg via ORAL
  Filled 2018-04-20: qty 1

## 2018-04-20 NOTE — ED Triage Notes (Signed)
Pt verbalizes "crotch" pain going down legs and urine is dark; onset 2 days ago.

## 2018-04-20 NOTE — ED Notes (Signed)
Pt c/o itching after her dose of Dilaudid. Dr. Eulis Foster was notified. Gave verbal order for 12.5mg  benadryl.

## 2018-04-20 NOTE — Discharge Instructions (Signed)
The test today indicate that you have a pelvic infection, requiring treatment with antibiotics.  Avoid sexual intercourse, until your treatment is completed.  Follow-up with your primary care doctor for further evaluation and treatment in a week or so.  You should inform any sexual contacts of a possibility of a contagious infection.

## 2018-04-20 NOTE — ED Provider Notes (Signed)
[facility] DEPT Provider Note   CSN: [id number] Arrival date & time: 04/20/18  1346     History   Chief Complaint Chief Complaint  Patient presents with  . Body Pain  . Pelvic Pain    HPI Janet Ramos is a 57 y.o. female.  HPI   She presents for evaluation of pain in her vagina, both legs, lower back, and abdomen present for 1 week.  She saw her PCP 1 week ago at that time was prescribed a pill for "yeast in the vagina."  She denies fever, chills, nausea, vomiting.  She states she is not sexually active.  She denies change in habits.  She denies dysuria, urinary frequency or hematuria.  No prior similar problems.  There are no other known modifying factors.  Past Medical History:  Diagnosis Date  . 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  STates this is when she started drinking heaviily  . Bipolar 1 disorder ([place])   . Seizures ([place])   . Trichimoniasis 2010    Patient Active Problem List   Diagnosis Date Noted  . Thickened endometrium 02/09/2018  . Substance induced mood disorder ([place]) 10/31/2017  . Left knee pain 08/28/2015  . Headache 05/08/2015  . Hyperlipidemia 05/08/2015  . Hypokalemia 05/08/2015  . Right sided weakness 05/07/2015  . Syncope 05/07/2015  . Polysubstance abuse ([place]) 05/07/2015  . 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 CESAREAN SECTION  1990  . ENUCLEATION Left 09/2014   Damage from remote that hit her eye  . EYE SURGERY Right 06/2015   cyst removal from medial canthus  . RUPTURED GLOBE EXPLORATION AND REPAIR Left 09/16/2014   Procedure:  Ruptured Globe Repair Left Eye;  Surgeon: Lamonte Sakai, MD;  Location: [facility];  Service: Ophthalmology;  Laterality: Left;  . tubaligation  1994     OB History    Gravida  6   Para  6   Term  6   Preterm      AB      Living  6     SAB      TAB      Ectopic      Multiple      Live Births  6            Home Medications    Prior to Admission medications   Medication Sig Start Date End Date Taking? Authorizing Provider  fluconazole (DIFLUCAN) 150 MG tablet Take 1 tablet (150 mg total) by mouth once a week. 04/07/18  Yes Clent Demark, PA-C  ibuprofen (ADVIL,MOTRIN) 200 MG tablet Take 800 mg by mouth every 6 (six) hours as needed for mild pain or moderate pain.   Yes [provider]  acamprosate (CAMPRAL) 333 MG tablet Take 2 tablets (666 mg total) by mouth 3 (three) times daily. 04/07/18   Clent Demark, PA-C  atorvastatin (LIPITOR) 40 MG tablet Take 1 tablet (40 mg total) by mouth daily. 04/07/18   Clent Demark, PA-C  doxycycline (VIBRAMYCIN) 100 MG capsule Take 1 capsule (100 mg total) by mouth 2 (two) times daily. One po bid x 7 days 04/20/18   Daleen Bo, MD  HYDROcodone-acetaminophen [facility]) 5-325 MG tablet Take 2 tablets by mouth every 4 (four) hours as needed. 04/20/18   Daleen Bo, MD  metroNIDAZOLE (FLAGYL) 500 MG tablet Take 1 tablet (500  mg total) by mouth 2 (two) times daily. One po bid x 7 days 04/20/18   Daleen Bo, MD  risperiDONE (RISPERDAL) 1 MG tablet Take 1 tablet (1 mg total) by mouth at bedtime. 04/07/18   Clent Demark, PA-C  thiamine (VITAMIN B-1) 100 MG tablet Take 1 tablet (100 mg total) by mouth 3 (three) times daily. 04/07/18   Clent Demark, PA-C    Family History Family History  Problem Relation Age of Onset  . Lupus Mother   . Heart disease Mother   . Colon cancer Father 48  . Thyroid disease Daughter   . Bipolar disorder Daughter   . Hypertension Other   . Cancer Other   . CAD Other     Social History Social History   Tobacco Use  . Smoking status: Current Every Day Smoker    Packs/day: 0.50    Start date: 02/24/1976  . Smokeless tobacco: Never Used  . Tobacco comment: Working on it.  Substance Use Topics  . Alcohol use: Yes    Alcohol/week: 4.8 oz    Types: 8  Standard drinks or equivalent per week    Comment: daily use  . Drug use: Yes    Types: Marijuana     Allergies   Patient has no known allergies.   Review of Systems Review of Systems  All other systems reviewed and are negative.    Physical Exam Updated Vital Signs BP 124/79 (BP Location: Right Arm)   Pulse 77   Temp 97.7 F (36.5 C) (Oral)   Resp 15   Ht 5\' 4"  (1.626 m)   Wt 85.3 kg (188 lb)   LMP 03/15/2011   SpO2 99%   BMI 32.27 kg/m   Physical Exam  Constitutional: She is oriented to person, place, and time. She appears well-developed and well-nourished.  HENT:  Head: Normocephalic and atraumatic.  Eyes: Pupils are equal, round, and reactive to light. Conjunctivae and EOM are normal.  Neck: Normal range of motion and phonation normal. Neck supple.  Cardiovascular: Normal rate and regular rhythm.  Pulmonary/Chest: Effort normal and breath sounds normal. She exhibits no tenderness.  Abdominal: Soft. She exhibits no distension. There is tenderness (Left lower quadrant, mild). There is no guarding.  Genitourinary:  Genitourinary Comments: Normal external female genitalia.  Opaque green-colored vaginal discharge present.  On bimanual examination there is moderate left pelvic tenderness without palpable pelvic mass.  Unable to appreciate uterus or ovaries on clinical exam.  Musculoskeletal: Normal range of motion.  Neurological: She is alert and oriented to person, place, and time. She exhibits normal muscle tone.  Skin: Skin is warm and dry.  Psychiatric: She has a normal mood and affect. Her behavior is normal. Judgment and thought content normal.  Nursing note and vitals reviewed.    ED Treatments / Results  Labs (all labs ordered are listed, but only abnormal results are displayed) Labs Reviewed  WET PREP, GENITAL - Abnormal; Notable for the following components:      Result Value   WBC, Wet Prep HPF POC MANY (*)    All other components within normal limits   URINALYSIS, ROUTINE W REFLEX MICROSCOPIC - Abnormal; Notable for the following components:   Color, Urine STRAW (*)    Specific Gravity, Urine 1.004 (*)    Hgb urine dipstick SMALL (*)    Bacteria, UA RARE (*)    All other components within normal limits  CBC WITH DIFFERENTIAL/PLATELET - Abnormal; Notable for the following components:  MCHC 36.4 (*)    All other components within normal limits  COMPREHENSIVE METABOLIC PANEL  RPR  HIV ANTIBODY (ROUTINE TESTING)  GC/CHLAMYDIA PROBE AMP ([place]) NOT AT [facility]    EKG None  Radiology Ct Abdomen Pelvis W Contrast  Result Date: 04/20/2018 CLINICAL DATA:  Acute abdominal pain. EXAM: CT ABDOMEN AND PELVIS WITH CONTRAST TECHNIQUE: Multidetector CT imaging of the abdomen and pelvis was performed using the standard protocol following bolus administration of intravenous contrast. CONTRAST:  176mL ISOVUE-300 IOPAMIDOL (ISOVUE-300) INJECTION 61% COMPARISON:  Pelvic ultrasound 01/05/2018. Remote abdominopelvic CT 11/14/2005 FINDINGS: Lower chest: No consolidation or pleural fluid. Calcified subcarinal and right infrahilar nodes consistent prior granulomatous disease. Heart is normal in size. Hepatobiliary: Ill-defined subcentimeter hypodensity in the subcapsular right lobe of the liver is too small to characterize but likely small cyst or hemangioma. Mild generalized decreased hepatic density consistent with steatosis. Gallbladder is partially distended without calcified gallstone. There is a Phrygian cap. No biliary dilatation. Pancreas: No ductal dilatation or inflammation. Spleen: Normal in size without focal abnormality. Adrenals/Urinary Tract: No adrenal nodule. No hydronephrosis or perinephric edema. Homogeneous renal enhancement with symmetric excretion on delayed phase imaging. Urinary bladder is distended without wall thickening. Stomach/Bowel: Stomach is nondistended. Incidental duodenum diverticulum without inflammation. No bowel wall  thickening, inflammatory change or obstruction. Normal appendix. Small volume of colonic stool. Diverticulosis of the descending and sigmoid colon without diverticulitis. Vascular/Lymphatic: Normal caliber abdominal aorta. No acute vascular findings. Small retroperitoneal, right external iliac and inguinal nodes not enlarged by size criteria, largest 7 mm short axis. Reproductive: Endometrial fluid or thickening of 10 mm. Ovaries tentatively identified and normal. No adnexal mass. Other: Fat within both inguinal canals without bowel involvement. No free air, free fluid, or intra-abdominal fluid collection. Musculoskeletal: Degenerative change in the lower lumbar spine with facet hypertrophy and degenerative disc disease. There are no acute or suspicious osseous abnormalities. IMPRESSION: 1. No acute findings in the abdomen/pelvis. 2. Colonic diverticulosis without diverticulitis. 3. Endometrial fluid or thickening of 10 mm, previously characterized on ultrasound 4 months ago. 4. Hepatic steatosis. Electronically Signed   By: Jeb Levering M.D.   On: 04/20/2018 22:09    Procedures Procedures (including critical care time)  Medications Ordered in ED Medications  0.9 %  sodium chloride infusion ( Intravenous New Bag/Given 04/20/18 1717)  iopamidol (ISOVUE-300) 61 % injection (has no administration in time range)  oxyCODONE-acetaminophen (PERCOCET/ROXICET) 5-325 MG per tablet 1 tablet (1 tablet Oral Given 04/20/18 1650)  sodium chloride 0.9 % bolus 1,000 mL (0 mLs Intravenous Stopped 04/20/18 2118)  cefTRIAXone (ROCEPHIN) 1 g in sodium chloride 0.9 % 100 mL IVPB (0 g Intravenous Stopped 04/20/18 2010)  metroNIDAZOLE (FLAGYL) tablet 500 mg (500 mg Oral Given 04/20/18 1930)  doxycycline (VIBRA-TABS) tablet 100 mg (100 mg Oral Given 04/20/18 1932)  HYDROmorphone (DILAUDID) injection 1 mg (1 mg Intravenous Given 04/20/18 2155)  ondansetron (ZOFRAN) injection 4 mg (4 mg Intravenous Given 04/20/18 2153)  iopamidol  (ISOVUE-300) 61 % injection 100 mL (100 mLs Intravenous Contrast Given 04/20/18 2139)  diphenhydrAMINE (BENADRYL) injection 12.5 mg (12.5 mg Intravenous Given 04/20/18 2230)     Initial Impression / Assessment and Plan / ED Course  I have reviewed the triage vital signs and the nursing notes.  Pertinent labs & imaging results that were available during my care of the patient were reviewed by me and considered in my medical decision making (see chart for details).  Clinical Course as of Apr 20 2257  [facility]  Apr 20, 2018  1746 CT abdomen pelvis ordered for evaluation of multiple complaints, back, abdomen, pelvis, legs.  Clinically patient has PID.   [EW]    Clinical Course User Index [EW] Daleen Bo, MD     Patient Vitals for the past 24 hrs:  BP Temp Temp src Pulse Resp SpO2 Height Weight  04/20/18 1939 124/79 - - 77 15 99 % - -  04/20/18 1730 (!) 151/75 - - 67 15 97 % - -  04/20/18 1602 - - - - - - 5\' 4"  (1.626 m) 85.3 kg (188 lb)  04/20/18 1400 (!) 131/104 97.7 F (36.5 C) Oral 79 16 96 % - -    10:54 PM Reevaluation with update and discussion. After initial assessment and treatment, an updated evaluation reveals she is comfortable now, findings discussed and questions answered. Daleen Bo   Medical Decision Making: Evaluation consistent with uncomplicated PID.  Doubt peritonitis.  Doubt TOA.  Doubt metabolic instability.  CRITICAL CARE-no Performed by: Daleen Bo   Nursing Notes Reviewed/ Care Coordinated Applicable Imaging Reviewed Interpretation of Laboratory Data incorporated into ED treatment  The patient appears reasonably screened and/or stabilized for discharge and I doubt any other medical condition or other [facility] requiring further screening, evaluation, or treatment in the ED at this time prior to discharge.  Plan: Home Medications-OTC analgesia if needed; Home Treatments-rest, fluids, advance diet; return here if the recommended treatment, does not improve the  symptoms; Recommended follow up-PCP follow-up 1 week and as needed     Final Clinical Impressions(s) / ED Diagnoses   Final diagnoses:  PID (acute pelvic inflammatory disease)    ED Discharge Orders        Ordered    doxycycline (VIBRAMYCIN) 100 MG capsule  2 times daily     04/20/18 2251    metroNIDAZOLE (FLAGYL) 500 MG tablet  2 times daily     04/20/18 2251    HYDROcodone-acetaminophen (NORCO) 5-325 MG tablet  Every 4 hours PRN     04/20/18 2253       Daleen Bo, MD 04/20/18 2259

## 2018-04-21 LAB — GC/CHLAMYDIA PROBE AMP (~~LOC~~) NOT AT ARMC
CHLAMYDIA, DNA PROBE: NEGATIVE
Neisseria Gonorrhea: NEGATIVE

## 2018-04-21 LAB — RPR: RPR Ser Ql: NONREACTIVE

## 2018-04-21 LAB — HIV ANTIBODY (ROUTINE TESTING W REFLEX): HIV SCREEN 4TH GENERATION: NONREACTIVE

## 2018-04-24 IMAGING — DX DG CHEST 2V
2 series · 2 of 2 positions shown · non-contrast
Comparison: Chest radiograph performed 01/03/2016

CLINICAL DATA: Acute onset of shortness of breath and bilateral
posterior rib pain. Mildly productive cough. Initial encounter.

EXAM:
CHEST  2 VIEW

[w chest pa]
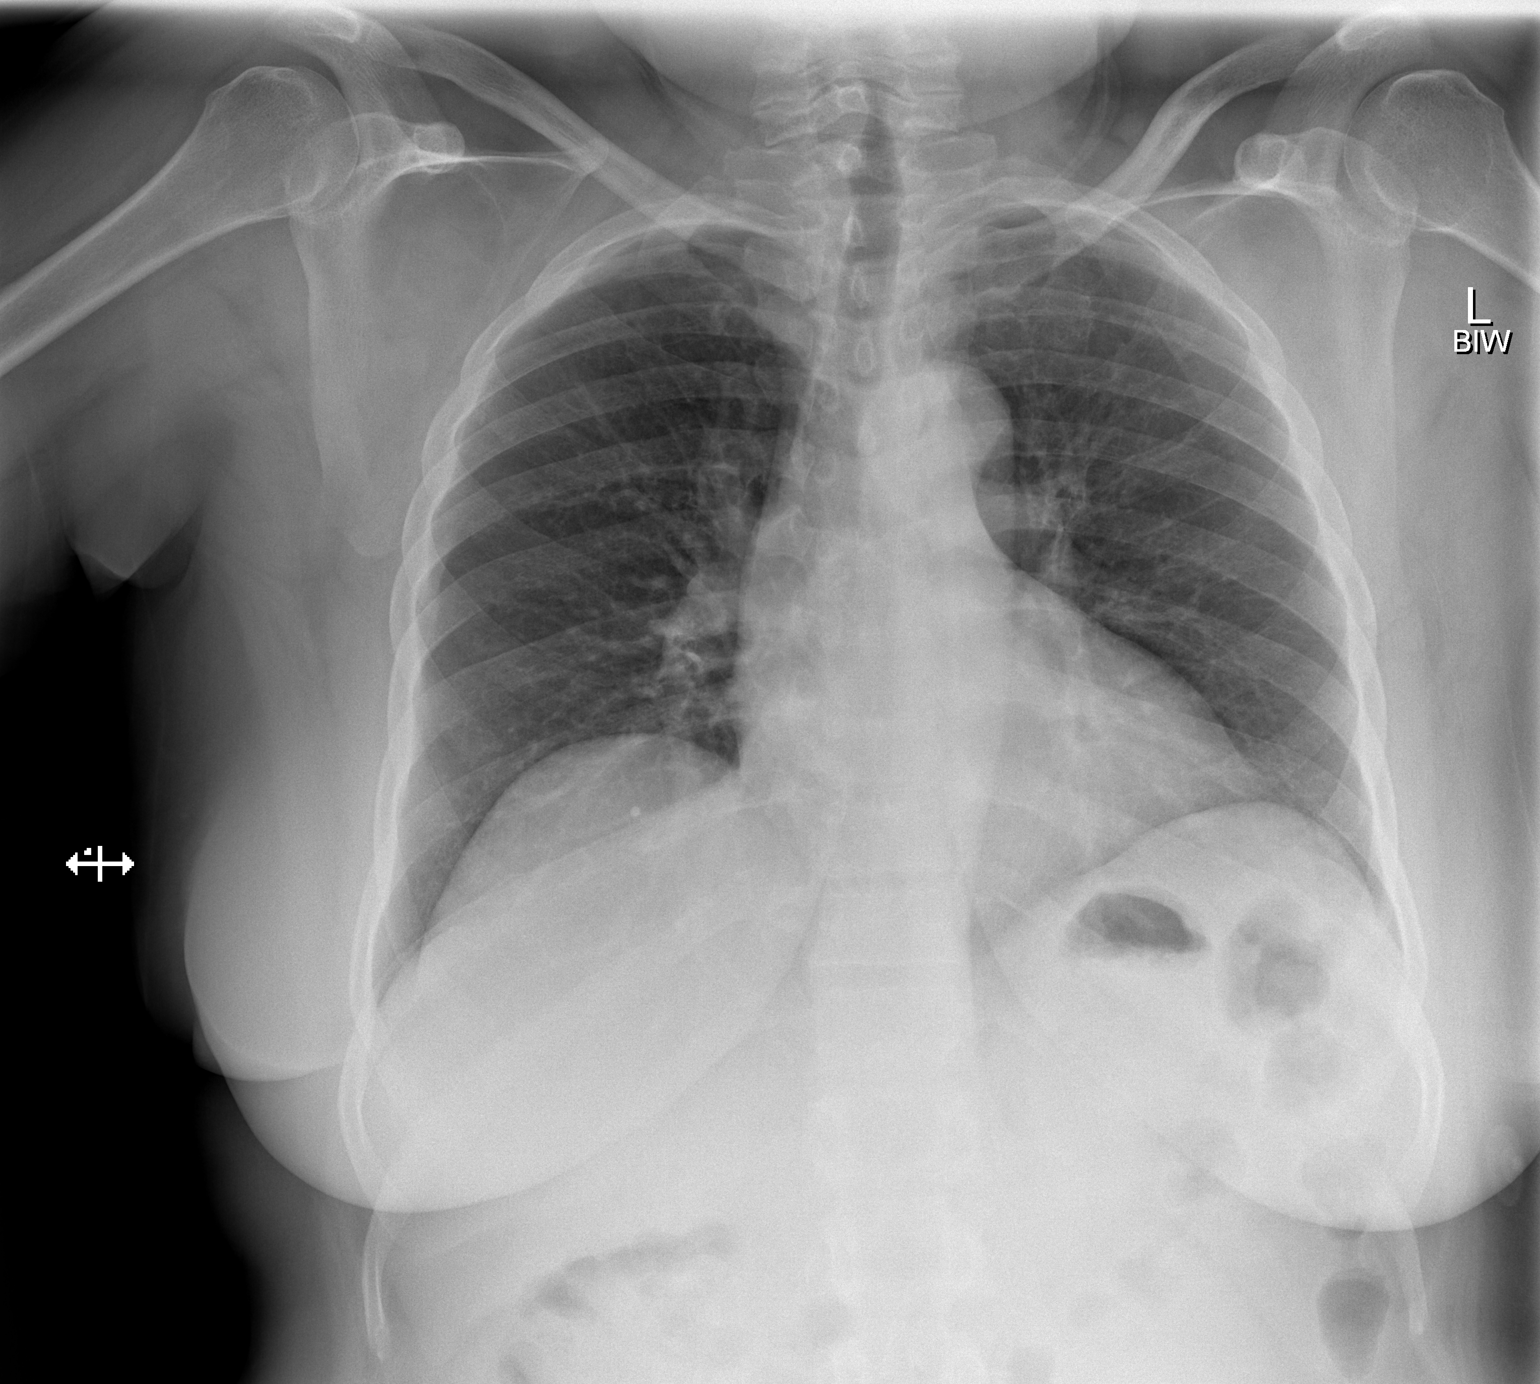

[w chest lat]
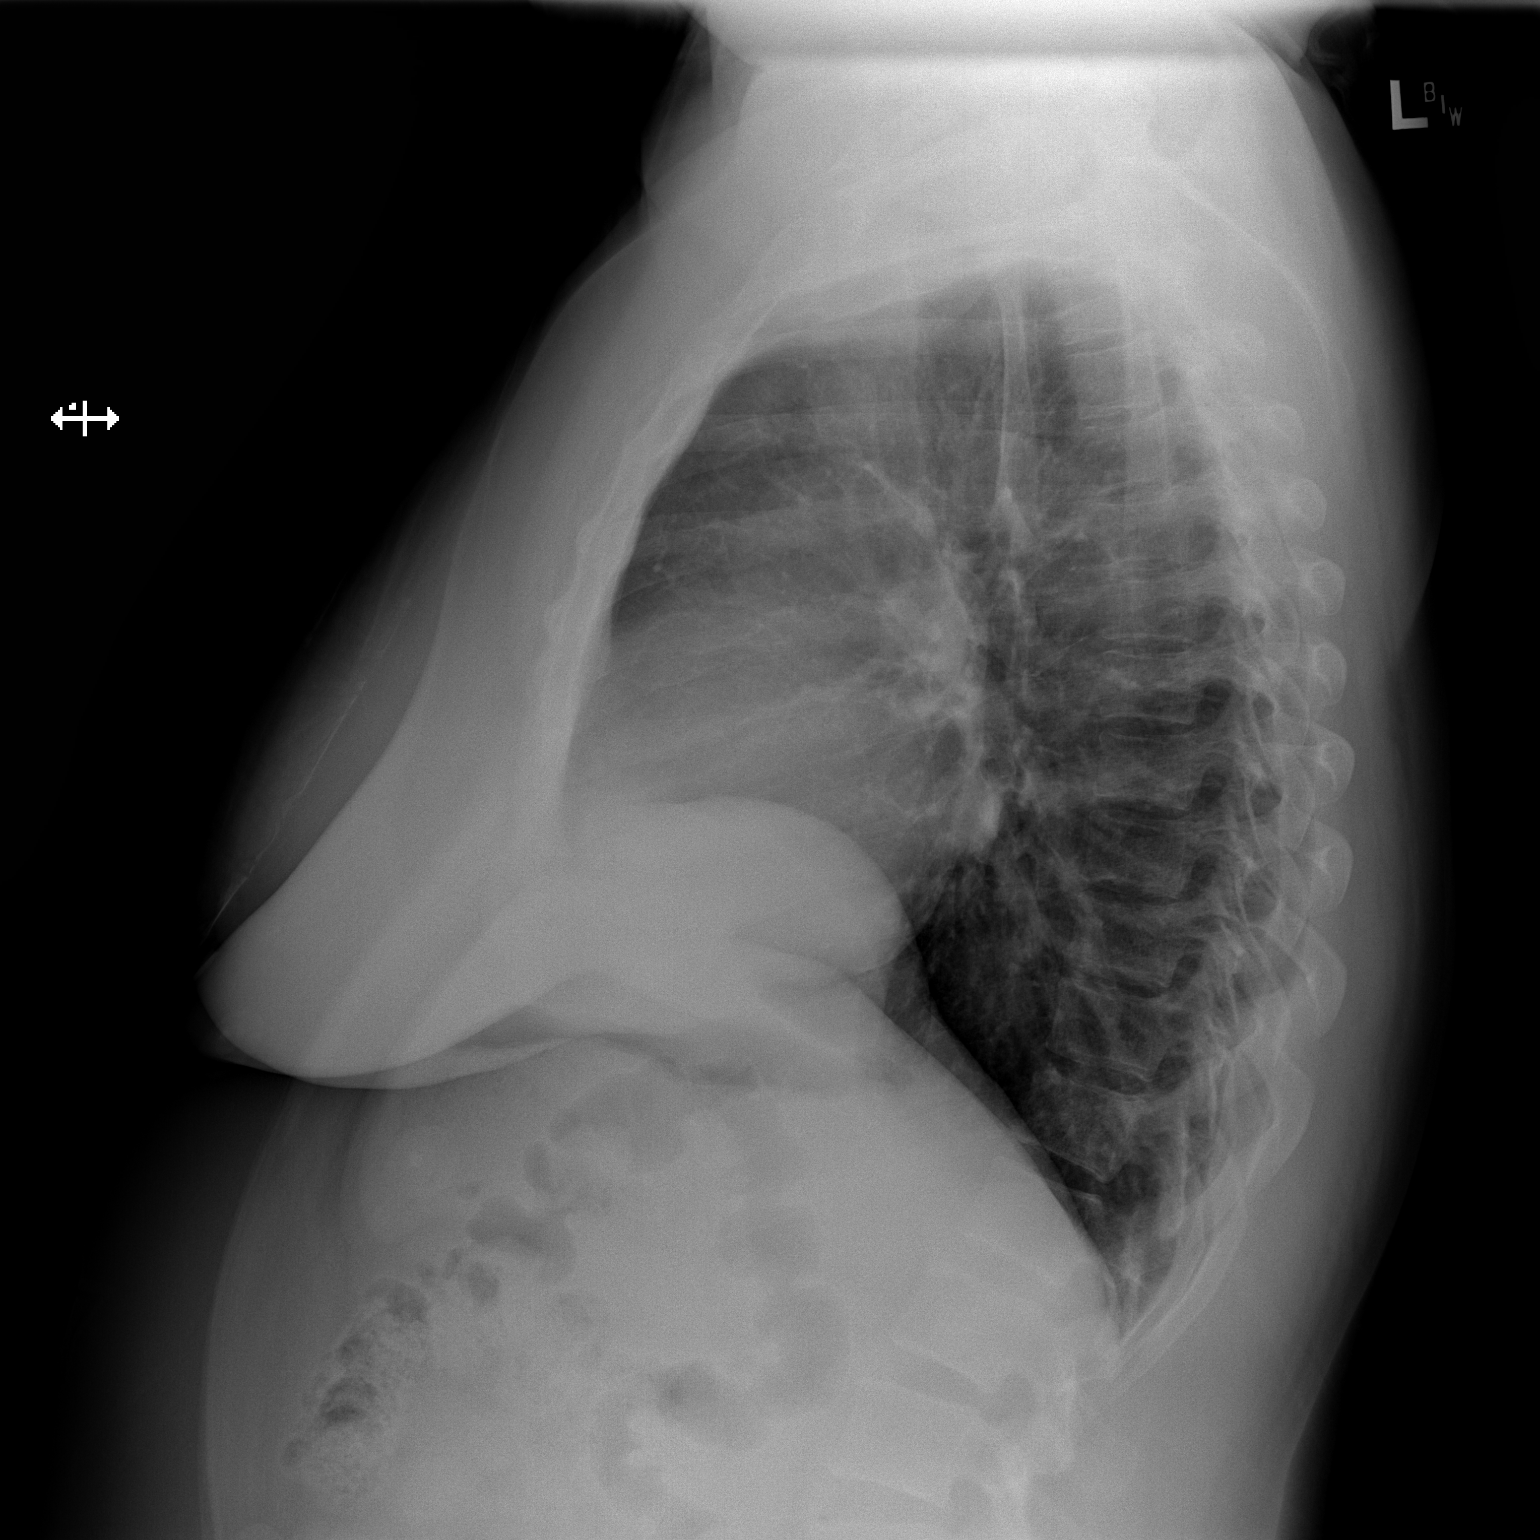

[2 of 2 positions shown; findings below may reference images not displayed]

FINDINGS: The lungs are well-aerated and clear. There is no evidence of focal
opacification, pleural effusion or pneumothorax.

The heart is normal in size; the mediastinal contour is within
normal limits. No acute osseous abnormalities are seen.
IMPRESSION: No acute cardiopulmonary process seen.

## 2018-04-24 MED FILL — metroNIDAZOLE 500 MG TABS: 500 | 7 days supply | Qty: 14 | Fill #0

## 2018-04-24 MED FILL — DOXYCYCLINE HYCLATE 100 MG: 100 | 7 days supply | Qty: 14 | Fill #0

## 2018-05-05 ENCOUNTER — Ambulatory Visit (INDEPENDENT_AMBULATORY_CARE_PROVIDER_SITE_OTHER): Payer: Self-pay | Admitting: Physician Assistant

## 2018-05-11 ENCOUNTER — Ambulatory Visit (HOSPITAL_COMMUNITY)
Admission: RE | Admit: 2018-05-11 | Discharge: 2018-05-11 | Disposition: A | Discharge status: Home / Self Care | Payer: Self-pay | Source: Ambulatory Visit | Attending: Obstetrics & Gynecology | Admitting: Obstetrics & Gynecology

## 2018-05-11 ENCOUNTER — Other Ambulatory Visit: Payer: Self-pay | Admitting: Obstetrics & Gynecology

## 2018-05-11 DIAGNOSIS — R9389 Abnormal findings on diagnostic imaging of other specified body structures: Secondary | ICD-10-CM

## 2018-05-11 DIAGNOSIS — N858 Other specified noninflammatory disorders of uterus: Secondary | ICD-10-CM | POA: Insufficient documentation

## 2018-05-19 ENCOUNTER — Telehealth: Payer: Self-pay | Admitting: General Practice

## 2018-05-19 NOTE — Telephone Encounter (Signed)
Called patient & informed her of results and provided appt reminder. Patient verbalized understanding & had no questions.

## 2018-05-19 NOTE — Telephone Encounter (Signed)
-----   Message from Woodroe Mode, MD sent at 05/13/2018  2:46 PM EDT ----- [place] result was reviewed and was stable. Please keep your scheduled appointment

## 2018-05-25 MED FILL — risperiDONE 1 MG TABS: 1 | 30 days supply | Qty: 30 | Fill #1

## 2018-06-04 ENCOUNTER — Other Ambulatory Visit: Payer: Self-pay

## 2018-06-04 ENCOUNTER — Ambulatory Visit (INDEPENDENT_AMBULATORY_CARE_PROVIDER_SITE_OTHER): Payer: Self-pay | Admitting: Obstetrics & Gynecology

## 2018-06-04 ENCOUNTER — Ambulatory Visit (INDEPENDENT_AMBULATORY_CARE_PROVIDER_SITE_OTHER): Payer: Self-pay | Admitting: Physician Assistant

## 2018-06-04 ENCOUNTER — Encounter (INDEPENDENT_AMBULATORY_CARE_PROVIDER_SITE_OTHER): Payer: Self-pay | Admitting: Physician Assistant

## 2018-06-04 ENCOUNTER — Encounter: Payer: Self-pay | Admitting: Obstetrics & Gynecology

## 2018-06-04 VITALS — BP 126/93 | HR 73 | Ht 64.0 in | Wt 190.5 lb

## 2018-06-04 VITALS — BP 125/86 | HR 75 | Temp 97.6°F | Ht 64.0 in | Wt 191.4 lb

## 2018-06-04 DIAGNOSIS — R9389 Abnormal findings on diagnostic imaging of other specified body structures: Secondary | ICD-10-CM

## 2018-06-04 DIAGNOSIS — Z1239 Encounter for other screening for malignant neoplasm of breast: Secondary | ICD-10-CM

## 2018-06-04 DIAGNOSIS — F1020 Alcohol dependence, uncomplicated: Secondary | ICD-10-CM

## 2018-06-04 DIAGNOSIS — Z1231 Encounter for screening mammogram for malignant neoplasm of breast: Secondary | ICD-10-CM

## 2018-06-04 DIAGNOSIS — E7841 Elevated Lipoprotein(a): Secondary | ICD-10-CM

## 2018-06-04 DIAGNOSIS — M25551 Pain in right hip: Secondary | ICD-10-CM

## 2018-06-04 MED ORDER — VITAMIN B-1 100 MG PO TABS: 100.0000 mg | ORAL_TABLET | Freq: Three times a day (TID) | ORAL | 1 refills | Status: DC

## 2018-06-04 MED ORDER — MEDROXYPROGESTERONE ACETATE 10 MG PO TABS: 20.0000 mg | ORAL_TABLET | Freq: Every day | ORAL | 4 refills | Status: DC

## 2018-06-04 MED ORDER — NAPROXEN 500 MG PO TABS: 500.0000 mg | ORAL_TABLET | Freq: Two times a day (BID) | ORAL | 0 refills | Status: DC

## 2018-06-04 MED ORDER — ATORVASTATIN CALCIUM 40 MG PO TABS: 40.0000 mg | ORAL_TABLET | Freq: Every day | ORAL | 5 refills | Status: DC

## 2018-06-04 NOTE — Progress Notes (Signed)
Chief Complaint  Patient presents with  . F/u after biopsy    HPI Janet Ramos is a 57 y.o. female.  [id number] Patient's last menstrual period was 03/15/2011. She returns for f/u after endometrial biopsy was done to evaluate thickened endometrium. She has had no bleeding for 7 years.. She has brief sharp llq pains associated with activity. HPI      Past Medical History:  Diagnosis Date  . 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  STates this is when she started drinking heaviily  . Bipolar 1 disorder ([place])   . Seizures ([place])   . Trichimoniasis 2010         Past Surgical History:  Procedure Laterality Date  . CESAREAN SECTION  1990  . ENUCLEATION Left 09/2014   Damage from remote that hit her eye  . EYE SURGERY Right 06/2015   cyst removal from medial canthus  . RUPTURED GLOBE EXPLORATION AND REPAIR Left 09/16/2014   Procedure:  Ruptured Globe Repair Left Eye;  Surgeon: Lamonte Sakai, MD;  Location: [facility];  Service: Ophthalmology;  Laterality: Left;  . tubaligation  1994         Family History  Problem Relation Age of Onset  . Lupus Mother   . Heart disease Mother   . Colon cancer Father 28  . Thyroid disease Daughter   . Bipolar disorder Daughter   . Hypertension Other   . Cancer Other   . CAD Other     Social History Social History        Tobacco Use  . Smoking status: Current Every Day Smoker    Packs/day: 0.50    Start date: 02/24/1976  . Smokeless tobacco: Never Used  . Tobacco comment: Working on it.  Substance Use Topics  . Alcohol use: Yes    Alcohol/week: 4.8 oz    Types: 8 Standard drinks or equivalent per week    Comment: daily use  . Drug use: Yes    Types: Marijuana    No Known Allergies        Current Outpatient Medications  Medication Sig Dispense Refill  . atorvastatin (LIPITOR) 40 MG tablet Take 1 tablet (40 mg total) by mouth daily. 90 tablet 3  .  B Complex-C (B-COMPLEX WITH VITAMIN C) tablet Take 1 tablet by mouth daily. 30 tablet 0  . docusate sodium (COLACE) 50 MG capsule Take 1 capsule (50 mg total) by mouth 2 (two) times daily. 30 capsule 0  . ferrous sulfate 325 (65 FE) MG EC tablet Take 1 tablet (325 mg total) by mouth 3 (three) times daily with meals. 90 tablet 0  . ibuprofen (ADVIL,MOTRIN) 200 MG tablet Take 800 mg by mouth every 6 (six) hours as needed for mild pain or moderate pain.    Marland Kitchen LORazepam (ATIVAN) 2 MG tablet Take one tablet (2 mg) every hour so long as symptoms of delirium tremens are present. Do not exceed 10 mg per day. 25 tablet 0  . Multiple Vitamin (MULTIVITAMIN WITH MINERALS) TABS tablet Take 1 tablet by mouth daily. 30 tablet 0  . risperiDONE (RISPERDAL) 1 MG tablet Take 1 tablet (1 mg total) by mouth at bedtime. 30 tablet 2  . thiamine (VITAMIN B-1) 100 MG tablet Take 1 tablet (100 mg total) by mouth 3 (three) times daily. 42 tablet 0  . medroxyPROGESTERone (PROVERA) 10 MG tablet Take 2 tablets (20 mg total) by mouth daily. 30 tablet  2   No current facility-administered medications for this visit.     Review of Systems Review of Systems  Constitutional: Negative.   Gastrointestinal: Negative.   Genitourinary: Positive for pelvic pain. Negative for dysuria, frequency, hematuria and vaginal bleeding.    Blood pressure (!) 126/93, pulse 73, height 5\' 4"  (1.626 m), weight 190 lb 8 oz (86.4 kg), last menstrual period 03/15/2011.   Physical Exam Physical Exam  Constitutional: She appears well-developed and well-nourished.  Pulmonary/Chest: Effort normal.   Psychiatric: She has a normal mood and affect.      Data Reviewed CLINICAL DATA: Initial evaluation for chronic right adnexal pain.  EXAM: TRANSABDOMINAL AND TRANSVAGINAL ULTRASOUND OF PELVIS  TECHNIQUE: Both transabdominal and transvaginal ultrasound examinations of the pelvis were performed. Transabdominal technique was performed  for global imaging of the pelvis including uterus, ovaries, adnexal regions, and pelvic cul-de-sac. It was necessary to proceed with endovaginal exam following the transabdominal exam to visualize the uterus, endometrium, and ovaries.  COMPARISON: None  FINDINGS: Uterus  Measurements: 6.5 x 3.4 x 4.0 cm. No fibroids or other mass visualized.  Endometrium  Endometrium is thickened up to 16.2 mm with multiple cystic areas present, largest of which measures 1.0 x 0.7 x 1.2 cm. No associated vascularity.  Right ovary  Not visualized. No adnexal mass.  Left ovary  Not visualized. No adnexal mass.  Other findings  No abnormal free fluid.  IMPRESSION: 1. Abnormally thickened multi cystic endometrium measuring up to 16 mm. Endometrial thickness is considered abnormal for an asymptomatic post-menopausal female. Endometrial sampling should be considered to exclude carcinoma. 2. Nonvisualization of the ovaries. No adnexal mass. 3. No other acute abnormality within the pelvis.   Electronically Signed By: Jeannine Boga M.D. On: 01/05/2018 17:48   Assessment    Thickened endometrium, no bleeding. She was unable to fill prescription for Provera but will be able to get it at [facility] Endometrial biopsy was normal  Plan    Financial application  RTC 4 months Provera 20 mg daily. Sent to [facility]        Emeterio Reeve 02/09/2018, 10:56 AM

## 2018-06-04 NOTE — Progress Notes (Signed)
Subjective:  Patient ID: Janet Ramos, female    DOB: 02-Feb-1961  Age: 57 y.o. MRN: [id number]  CC: f/u alcoholism  HPI  Janet Ramos a 57 y.o.femalewith a medical history of alcoholism, bipolar 1 disorder, seizures, left eye blindness, and trichomoniasis presentsto f/u on alcoholism. Last seen here for alcoholism nearly six months ago. At the time, pt reported acamprosate was "40%" effective and had not picked up her Lorazepam or Thiamine. She was advised to return in one week but did not f/u for alcoholism until now. Has run out of acamprosate. Does not remember if she took thiamine or lorazepam. Continues to drink alcohol, approximately "at least two 40s a day". Last drink one hour ago. Says she is not interested into going to rehab because she can quit on her own. Does not want to speak to [employer]. Endorses mild RUQ when drinking. Does not endorse CP, palpitations, SOB, HA, current abdominal pain, f/c/n/v, jaundice, swelling, rash, or GI/GU sxs.     Outpatient Medications Prior to Visit  Medication Sig Dispense Refill  . acamprosate (CAMPRAL) 333 MG tablet Take 2 tablets (666 mg total) by mouth 3 (three) times daily. (Patient not taking: Reported on 06/04/2018) 180 tablet 2  . atorvastatin (LIPITOR) 40 MG tablet Take 1 tablet (40 mg total) by mouth daily. (Patient not taking: Reported on 06/04/2018) 30 tablet 11  . doxycycline (VIBRAMYCIN) 100 MG capsule Take 1 capsule (100 mg total) by mouth 2 (two) times daily. One po bid x 7 days 14 capsule 0  . fluconazole (DIFLUCAN) 150 MG tablet Take 1 tablet (150 mg total) by mouth once a week. (Patient not taking: Reported on 06/04/2018) 2 tablet 0  . HYDROcodone-acetaminophen (NORCO) 5-325 MG tablet Take 2 tablets by mouth every 4 (four) hours as needed. (Patient not taking: Reported on 06/04/2018) 20 tablet 0  . ibuprofen (ADVIL,MOTRIN) 200 MG tablet Take 800 mg by mouth every 6 (six) hours as needed for mild pain or moderate  pain.    . medroxyPROGESTERone (PROVERA) 10 MG tablet Take 2 tablets (20 mg total) by mouth daily. 60 tablet 4  . risperiDONE (RISPERDAL) 1 MG tablet Take 1 tablet (1 mg total) by mouth at bedtime. 30 tablet 5  . thiamine (VITAMIN B-1) 100 MG tablet Take 1 tablet (100 mg total) by mouth 3 (three) times daily. (Patient not taking: Reported on 06/04/2018) 42 tablet 0   No facility-administered medications prior to visit.      ROS Review of Systems  Constitutional: Negative for chills, fever and malaise/fatigue.  Eyes: Negative for blurred vision.  Respiratory: Negative for shortness of breath.   Cardiovascular: Negative for chest pain and palpitations.  Gastrointestinal: Positive for abdominal pain. Negative for nausea.  Genitourinary: Negative for dysuria and hematuria.  Musculoskeletal: Negative for joint pain and myalgias.  Skin: Negative for rash.  Neurological: Negative for tingling and headaches.  Psychiatric/Behavioral: Negative for depression. The patient is not nervous/anxious.     Objective:  Ht 5\' 4"  (1.626 m)   LMP 03/15/2011   BMI 32.70 kg/m   Vitals:   06/04/18 1337  BP: 125/86  Pulse: 75  Temp: 97.6 F (36.4 C)  TempSrc: Oral  SpO2: 91%  Weight: 191 lb 6.4 oz (86.8 kg)  Height: 5\' 4"  (1.626 m)     Physical Exam  Constitutional: She is oriented to person, place, and time.  Well developed, well nourished, NAD, polite  HENT:  Head: Normocephalic and atraumatic.  Eyes: No scleral  icterus.  Neck: Normal range of motion. Neck supple. No thyromegaly present.  Cardiovascular: Normal rate, regular rhythm and normal heart sounds.  No LE edema bilaterally  Pulmonary/Chest: Effort normal and breath sounds normal.  Abdominal: Soft. Bowel sounds are normal. There is no tenderness.  Musculoskeletal: She exhibits no edema.  Neurological: She is alert and oriented to person, place, and time. No cranial nerve deficit.  Skin: Skin is warm and dry. No rash noted. No  erythema. No pallor.  Psychiatric: She has a normal mood and affect. Her behavior is normal. Thought content normal.  Vitals reviewed.    Assessment & Plan:    1. Alcoholism ([place]) - Pt does not want to quit at the moment. Declines rehab or counseling. - Comprehensive metabolic panel - CBC with Differential - Begin thiamine (VITAMIN B-1) 100 MG tablet; Take 1 tablet (100 mg total) by mouth 3 (three) times daily.  Dispense: 42 tablet; Refill: 1 - Says she has enough acamprosate at home from previous prescription and does not want refill.  2. Elevated lipoprotein(a) - Lipid panel - Begin atorvastatin (LIPITOR) 40 MG tablet; Take 1 tablet (40 mg total) by mouth daily.  Dispense: 30 tablet; Refill: 5  3. Screening for breast cancer - I messaged Ms. Rolena Infante to have pt enrolled into the [facility] program  4. Pain in joint involving right pelvic region and thigh - Begin naproxen (NAPROSYN) 500 MG tablet; Take 1 tablet (500 mg total) by mouth 2 (two) times daily with a meal.  Dispense: 30 tablet; Refill: 0 - Advised to continue work up with her GYN.    Meds ordered this encounter  Medications  . naproxen (NAPROSYN) 500 MG tablet    Sig: Take 1 tablet (500 mg total) by mouth 2 (two) times daily with a meal.    Dispense:  30 tablet    Refill:  0    Order Specific Question:   Supervising Provider    Answer:   Charlott Rakes [[id number]]  . atorvastatin (LIPITOR) 40 MG tablet    Sig: Take 1 tablet (40 mg total) by mouth daily.    Dispense:  30 tablet    Refill:  5    Order Specific Question:   Supervising Provider    Answer:   Charlott Rakes [[id number]]  . thiamine (VITAMIN B-1) 100 MG tablet    Sig: Take 1 tablet (100 mg total) by mouth 3 (three) times daily.    Dispense:  42 tablet    Refill:  1    Order Specific Question:   Supervising Provider    Answer:   Charlott Rakes [[id number]]    Follow-up: Return in about 6 months (around 12/05/2018), or if symptoms worsen or fail to improve,  for cholesterol.   Clent Demark PA

## 2018-06-04 NOTE — Patient Instructions (Signed)

## 2018-06-05 ENCOUNTER — Other Ambulatory Visit (INDEPENDENT_AMBULATORY_CARE_PROVIDER_SITE_OTHER): Payer: Self-pay | Admitting: Physician Assistant

## 2018-06-05 ENCOUNTER — Telehealth (INDEPENDENT_AMBULATORY_CARE_PROVIDER_SITE_OTHER): Payer: Self-pay

## 2018-06-05 LAB — CBC WITH DIFFERENTIAL/PLATELET
BASOS: 1 %
Basophils Absolute: 0 10*3/uL (ref 0.0–0.2)
EOS (ABSOLUTE): 0.1 10*3/uL (ref 0.0–0.4)
Eos: 2 %
HEMATOCRIT: 38.7 % (ref 34.0–46.6)
HEMOGLOBIN: 13.6 g/dL (ref 11.1–15.9)
IMMATURE GRANS (ABS): 0 10*3/uL (ref 0.0–0.1)
Immature Granulocytes: 0 %
LYMPHS: 48 %
Lymphocytes Absolute: 2.7 10*3/uL (ref 0.7–3.1)
MCH: 33.3 pg — AB (ref 26.6–33.0)
MCHC: 35.1 g/dL (ref 31.5–35.7)
MCV: 95 fL (ref 79–97)
MONOCYTES: 11 %
Monocytes Absolute: 0.6 10*3/uL (ref 0.1–0.9)
NEUTROS ABS: 2.1 10*3/uL (ref 1.4–7.0)
Neutrophils: 38 %
Platelets: 145 10*3/uL — ABNORMAL LOW (ref 150–450)
RBC: 4.09 x10E6/uL (ref 3.77–5.28)
RDW: 15.3 % (ref 12.3–15.4)
WBC: 5.5 10*3/uL (ref 3.4–10.8)

## 2018-06-05 LAB — COMPREHENSIVE METABOLIC PANEL
ALK PHOS: 68 IU/L (ref 39–117)
ALT: 23 IU/L (ref 0–32)
AST: 27 IU/L (ref 0–40)
Albumin/Globulin Ratio: 1.4 (ref 1.2–2.2)
Albumin: 4.3 g/dL (ref 3.5–5.5)
BUN/Creatinine Ratio: 11 (ref 9–23)
BUN: 9 mg/dL (ref 6–24)
Bilirubin Total: 0.2 mg/dL (ref 0.0–1.2)
CALCIUM: 9.8 mg/dL (ref 8.7–10.2)
CO2: 20 mmol/L (ref 20–29)
CREATININE: 0.79 mg/dL (ref 0.57–1.00)
Chloride: 103 mmol/L (ref 96–106)
GFR calc Af Amer: 97 mL/min/{1.73_m2} (ref >59)
GFR, EST NON AFRICAN AMERICAN: 85 mL/min/{1.73_m2} (ref >59)
GLOBULIN, TOTAL: 3.1 g/dL (ref 1.5–4.5)
Glucose: 105 mg/dL — ABNORMAL HIGH (ref 65–99)
Potassium: 4.4 mmol/L (ref 3.5–5.2)
SODIUM: 140 mmol/L (ref 134–144)
Total Protein: 7.4 g/dL (ref 6.0–8.5)

## 2018-06-05 LAB — LIPID PANEL
CHOLESTEROL TOTAL: 243 mg/dL — AB (ref 100–199)
Chol/HDL Ratio: 3.2 ratio (ref 0.0–4.4)
HDL: 77 mg/dL (ref >39)
LDL Calculated: 118 mg/dL — ABNORMAL HIGH (ref 0–99)
TRIGLYCERIDES: 240 mg/dL — AB (ref 0–149)
VLDL Cholesterol Cal: 48 mg/dL — ABNORMAL HIGH (ref 5–40)

## 2018-06-05 NOTE — Telephone Encounter (Signed)
-----   Message from Clent Demark, PA-C sent at 06/05/2018 12:35 PM EDT ----- Labs are normal/unremarkable except for her cholesterol. I have already prescribed atorvastatin for her. She should take atorvastatin as directed.

## 2018-06-05 NOTE — Telephone Encounter (Signed)
Patient is aware that all labs are normal other than he/r cholesterol. Atorvastatin already prescribed at [facility] on 8/22 take as directed. Nat Christen, CMA

## 2018-06-06 LAB — FECAL OCCULT BLOOD, IMMUNOCHEMICAL: FECAL OCCULT BLD: NEGATIVE

## 2018-06-08 ENCOUNTER — Telehealth (INDEPENDENT_AMBULATORY_CARE_PROVIDER_SITE_OTHER): Payer: Self-pay

## 2018-06-08 NOTE — Telephone Encounter (Signed)
-----   Message from Clent Demark, PA-C sent at 06/08/2018  8:37 AM EDT ----- FIT negative.

## 2018-06-08 NOTE — Telephone Encounter (Signed)
Patient is aware of negative FIT test. Nat Christen, CMA

## 2018-06-22 ENCOUNTER — Telehealth (HOSPITAL_COMMUNITY): Payer: Self-pay | Admitting: *Deleted

## 2018-06-22 NOTE — Telephone Encounter (Signed)
Telephoned patient at home number and left message to return call to [facility] 

## 2018-06-25 ENCOUNTER — Other Ambulatory Visit: Payer: Self-pay | Admitting: Obstetrics and Gynecology

## 2018-06-25 DIAGNOSIS — Z1231 Encounter for screening mammogram for malignant neoplasm of breast: Secondary | ICD-10-CM

## 2018-06-29 ENCOUNTER — Ambulatory Visit (INDEPENDENT_AMBULATORY_CARE_PROVIDER_SITE_OTHER): Payer: Self-pay | Admitting: Physician Assistant

## 2018-07-03 MED FILL — risperiDONE 1 MG TABS: 1 | 30 days supply | Qty: 30 | Fill #2

## 2018-07-03 MED FILL — ATORVASTATIN 40 MG TABLET: 40 | 30 days supply | Qty: 30 | Fill #0

## 2018-07-03 MED FILL — NAPROXEN 500 MG TABLET: 500 | 15 days supply | Qty: 30 | Fill #0

## 2018-07-03 MED FILL — MEDROXYPROGESTERONE ACETATE: 10 | 30 days supply | Qty: 60 | Fill #0

## 2018-07-13 ENCOUNTER — Ambulatory Visit (INDEPENDENT_AMBULATORY_CARE_PROVIDER_SITE_OTHER): Payer: Self-pay | Admitting: Physician Assistant

## 2018-07-13 ENCOUNTER — Encounter (INDEPENDENT_AMBULATORY_CARE_PROVIDER_SITE_OTHER): Payer: Self-pay | Admitting: Physician Assistant

## 2018-07-13 VITALS — BP 118/83 | HR 81 | Temp 97.9°F | Ht 64.0 in | Wt 190.6 lb

## 2018-07-13 DIAGNOSIS — B373 Candidiasis of vulva and vagina: Secondary | ICD-10-CM

## 2018-07-13 DIAGNOSIS — B3731 Acute candidiasis of vulva and vagina: Secondary | ICD-10-CM

## 2018-07-13 MED ORDER — FLUCONAZOLE 150 MG PO TABS: 150.0000 mg | ORAL_TABLET | ORAL | 0 refills | Status: DC

## 2018-07-13 MED FILL — FLUCONAZOLE 150 MG TABS: 150 | 6 days supply | Qty: 2 | Fill #0

## 2018-07-13 NOTE — Progress Notes (Signed)
Subjective:  Patient ID: Janet Ramos, female    DOB: [birth year]  Age: 57 y.o. MRN: [id number]  CC: vaginitis  HPI Janet Ramos a 57 y.o.femalewith a medical history of alcoholism, bipolar 1 disorder, seizures, left eye blindness, and trichomoniasis presentswith 2-3 day history of vaginal burning, itching, thick white vaginal discharge, and blood spots on tissue from scratch at labia. Took antibiotics two months ago for PID but nothing more recent. Does not endorse any other associated symptoms. Not sexually active.      Outpatient Medications Prior to Visit  Medication Sig Dispense Refill  . atorvastatin (LIPITOR) 40 MG tablet Take 1 tablet (40 mg total) by mouth daily. 30 tablet 5  . naproxen (NAPROSYN) 500 MG tablet Take 1 tablet (500 mg total) by mouth 2 (two) times daily with a meal. 30 tablet 0  . risperiDONE (RISPERDAL) 1 MG tablet Take 1 tablet (1 mg total) by mouth at bedtime. 30 tablet 5  . thiamine (VITAMIN B-1) 100 MG tablet Take 1 tablet (100 mg total) by mouth 3 (three) times daily. 42 tablet 1  . acamprosate (CAMPRAL) 333 MG tablet Take 2 tablets (666 mg total) by mouth 3 (three) times daily. (Patient not taking: Reported on 06/04/2018) 180 tablet 2   No facility-administered medications prior to visit.      ROS Review of Systems  Constitutional: Negative for chills, fever and malaise/fatigue.  Eyes: Negative for blurred vision.  Respiratory: Negative for shortness of breath.   Cardiovascular: Negative for chest pain and palpitations.  Gastrointestinal: Negative for abdominal pain and nausea.  Genitourinary: Negative for dysuria and hematuria.       Vaginal itching and burning  Musculoskeletal: Negative for joint pain and myalgias.  Skin: Negative for rash.  Neurological: Negative for tingling and headaches.  Psychiatric/Behavioral: Negative for depression. The patient is not nervous/anxious.     Objective:  BP 118/83   Pulse 81   Temp 97.9 F (36.6  C) (Oral)   Ht 5\' 4"  (1.626 m)   Wt 190 lb 9.6 oz (86.5 kg)   LMP 03/15/2011   SpO2 95%   BMI 32.72 kg/m   BP/Weight 07/13/2018 06/04/2018 5/73/2202  Systolic BP 542 706 237  Diastolic BP 83 86 93  Wt. (Lbs) 190.6 191.4 190.5  BMI 32.72 32.85 32.7      Physical Exam  Constitutional: She is oriented to person, place, and time.  Well developed, well nourished, NAD, polite  HENT:  Head: Normocephalic and atraumatic.  Eyes: No scleral icterus.  Neck: Normal range of motion. No thyromegaly present.  Pulmonary/Chest: Effort normal.  Musculoskeletal: She exhibits no edema.  Neurological: She is alert and oriented to person, place, and time.  Skin: Skin is warm and dry. No rash noted. No erythema. No pallor.  Psychiatric: She has a normal mood and affect. Her behavior is normal. Thought content normal.  Vitals reviewed.    Assessment & Plan:   1. Vaginal yeast infection - Begin Fluconazole   Meds ordered this encounter  Medications  . fluconazole (DIFLUCAN) 150 MG tablet    Sig: Take 1 tablet (150 mg total) by mouth every 3 (three) days.    Dispense:  2 tablet    Refill:  0    Order Specific Question:   Supervising Provider    Answer:   Charlott Rakes [[id number]]    Follow-up: Return if symptoms worsen or fail to improve.   Clent Demark PA

## 2018-07-13 NOTE — Patient Instructions (Addendum)
See a provider should your symptoms persist despite use of Fluconazole.    Vaginal Yeast infection, Adult Vaginal yeast infection is a condition that causes soreness, swelling, and redness (inflammation) of the vagina. It also causes vaginal discharge. This is a common condition. Some women get this infection frequently. What are the causes? This condition is caused by a change in the normal balance of the yeast (candida) and bacteria that live in the vagina. This change causes an overgrowth of yeast, which causes the inflammation. What increases the risk? This condition is more likely to develop in:  Women who take antibiotic medicines.  Women who have diabetes.  Women who take birth control pills.  Women who are pregnant.  Women who douche often.  Women who have a weak defense (immune) system.  Women who have been taking steroid medicines for a long time.  Women who frequently wear tight clothing.  What are the signs or symptoms? Symptoms of this condition include:  White, thick vaginal discharge.  Swelling, itching, redness, and irritation of the vagina. The lips of the vagina (vulva) may be affected as well.  Pain or a burning feeling while urinating.  Pain during sex.  How is this diagnosed? This condition is diagnosed with a medical history and physical exam. This will include a pelvic exam. Your health care provider will examine a sample of your vaginal discharge under a microscope. Your health care provider may send this sample for testing to confirm the diagnosis. How is this treated? This condition is treated with medicine. Medicines may be over-the-counter or prescription. You may be told to use one or more of the following:  Medicine that is taken orally.  Medicine that is applied as a cream.  Medicine that is inserted directly into the vagina (suppository).  Follow these instructions at home:  Take or apply over-the-counter and prescription medicines  only as told by your health care provider.  Do not have sex until your health care provider has approved. Tell your sex partner that you have a yeast infection. That person should go to his or her health care provider if he or she develops symptoms.  Do not wear tight clothes, such as pantyhose or tight pants.  Avoid using tampons until your health care provider approves.  Eat more yogurt. This may help to keep your yeast infection from returning.  Try taking a sitz bath to help with discomfort. This is a warm water bath that is taken while you are sitting down. The water should only come up to your hips and should cover your buttocks. Do this 3-4 times per day or as told by your health care provider.  Do not douche.  Wear breathable, cotton underwear.  If you have diabetes, keep your blood sugar levels under control. Contact a health care provider if:  You have a fever.  Your symptoms go away and then return.  Your symptoms do not get better with treatment.  Your symptoms get worse.  You have new symptoms.  You develop blisters in or around your vagina.  You have blood coming from your vagina and it is not your menstrual period.  You develop pain in your abdomen. This information is not intended to replace advice given to you by your health care provider. Make sure you discuss any questions you have with your health care provider. Document Released: 07/10/2005 Document Revised: 03/13/2016 Document Reviewed: 04/03/2015 Elsevier Interactive Patient Education  2018 [employer].

## 2018-07-24 ENCOUNTER — Ambulatory Visit (INDEPENDENT_AMBULATORY_CARE_PROVIDER_SITE_OTHER): Payer: Self-pay

## 2018-08-19 MED FILL — risperiDONE 1 MG TABS: 1 | 30 days supply | Qty: 30 | Fill #3

## 2018-08-19 MED FILL — MEDROXYPROGESTERONE 10 MG T: 10 | 30 days supply | Qty: 60 | Fill #1

## 2018-08-19 MED FILL — ATORVASTATIN 40 MG TABLET: 40 | 30 days supply | Qty: 30 | Fill #1

## 2018-09-01 ENCOUNTER — Ambulatory Visit
Admission: RE | Admit: 2018-09-01 | Discharge: 2018-09-01 | Disposition: A | Discharge status: Home / Self Care | Payer: No Typology Code available for payment source | Source: Ambulatory Visit | Attending: Obstetrics and Gynecology | Admitting: Obstetrics and Gynecology

## 2018-09-01 ENCOUNTER — Ambulatory Visit (HOSPITAL_COMMUNITY)
Admission: RE | Admit: 2018-09-01 | Discharge: 2018-09-01 | Disposition: A | Discharge status: Home / Self Care | Payer: Self-pay | Source: Ambulatory Visit | Attending: Obstetrics and Gynecology | Admitting: Obstetrics and Gynecology

## 2018-09-01 ENCOUNTER — Encounter (HOSPITAL_COMMUNITY): Payer: Self-pay

## 2018-09-01 VITALS — BP 124/86 | Wt 194.0 lb

## 2018-09-01 DIAGNOSIS — Z1231 Encounter for screening mammogram for malignant neoplasm of breast: Secondary | ICD-10-CM

## 2018-09-01 DIAGNOSIS — Z1239 Encounter for other screening for malignant neoplasm of breast: Secondary | ICD-10-CM

## 2018-09-01 NOTE — Patient Instructions (Addendum)
Explained breast self awareness with Posey Boyer. Patient did not need a Pap smear today due to last Pap smear was 12/23/2017. Let her know [facility] will cover Pap smears every 3 years unless has a history of abnormal Pap smears. Referred patient to the [facility] for a screening mammogram. Appointment scheduled for Tuesday, September 01, 2018 at 1600. Patient aware of appointment and will be there. Let patient know the [facility] will follow up with her within the next couple weeks with results of mammogram by letter or phone. Discussed smoking cessation with patient. Referred patient to the [facility] Quitline and gave resources to the free smoking cessation classes at [facility]. Posey Boyer verbalized understanding.  Zyriah Mask, Arvil Chaco, RN 2:29 PM

## 2018-09-01 NOTE — Progress Notes (Signed)
No complaints today.   Pap Smear: Pap smear not completed today. Last Pap smear was 12/23/2017 at [facility] and normal. Per patient has no history of an abnormal Pap smear. Last Pap smear result is in Epic.  Physical exam: Breasts Breasts symmetrical. No skin abnormalities bilateral breasts. No nipple retraction bilateral breasts. No nipple discharge bilateral breasts. No lymphadenopathy. No lumps palpated bilateral breasts. No complaints of pain or tenderness on exam. Referred patient to the [facility] for a screening mammogram. Appointment scheduled for Tuesday, September 01, 2018 at 1600.        Pelvic/Bimanual No Pap smear completed today since last Pap smear was 12/23/2017. Pap smear not indicated per [facility] guidelines.   Smoking History: Patient is a current smoker. Discussed smoking cessation with patient. Referred patient to the [facility] Quitline and gave resources to the free smoking cessation classes at [facility].  Patient Navigation: Patient education provided. Access to services provided for patient through [facility] program.    Colorectal Cancer Screening: Per patient has never had a colonoscopy completed. Patient completed a FIT Test 06/04/2018 that was negative. No complaints today.   Breast and Cervical Cancer Risk Assessment: Patient has no family history of breast cancer, known genetic mutations, or radiation treatment to the chest before age 71. Patient has no history of cervical dysplasia, immunocompromised, or DES exposure in-utero.  Risk Assessment    Risk Scores      09/01/2018   Last edited by: Armond Hang, LPN   5-year risk: 1.4 %   Lifetime risk: 7.8 %

## 2018-09-14 ENCOUNTER — Encounter (HOSPITAL_COMMUNITY): Payer: Self-pay | Admitting: *Deleted

## 2018-09-16 ENCOUNTER — Other Ambulatory Visit (HOSPITAL_COMMUNITY): Payer: Self-pay | Admitting: *Deleted

## 2018-09-16 DIAGNOSIS — Z0000 Encounter for general adult medical examination without abnormal findings: Secondary | ICD-10-CM

## 2018-09-18 ENCOUNTER — Other Ambulatory Visit: Payer: Self-pay

## 2018-09-18 ENCOUNTER — Ambulatory Visit: Payer: Self-pay

## 2018-09-23 MED FILL — MEDROXYPROGESTERONE ACETATE: 10 | 30 days supply | Qty: 60 | Fill #2

## 2018-09-23 MED FILL — ATORVASTATIN 40 MG TABLET: 40 | 30 days supply | Qty: 30 | Fill #2

## 2018-09-23 MED FILL — risperiDONE 1 MG TABS: 1 | 30 days supply | Qty: 30 | Fill #4

## 2018-09-24 ENCOUNTER — Ambulatory Visit (INDEPENDENT_AMBULATORY_CARE_PROVIDER_SITE_OTHER): Payer: Self-pay | Admitting: Obstetrics & Gynecology

## 2018-09-24 ENCOUNTER — Encounter: Payer: Self-pay | Admitting: Obstetrics & Gynecology

## 2018-09-24 VITALS — BP 121/82 | HR 89 | Wt 188.2 lb

## 2018-09-24 DIAGNOSIS — R9389 Abnormal findings on diagnostic imaging of other specified body structures: Secondary | ICD-10-CM

## 2018-09-24 NOTE — Progress Notes (Signed)
  Subjective:     Patient ID: Janet Ramos, female   DOB: December 05, 1960, 57 y.o.   MRN: [id number]  [id number] Patient's last menstrual period was 03/15/2011. She has light spotting while using Provera PO daily. US showed thickened endometrium and biopsy was benign.    Review of Systems  Constitutional: Negative.   Gastrointestinal: Negative.   Genitourinary: Positive for vaginal bleeding. Negative for pelvic pain and vaginal discharge.       Objective:   Physical Exam Vitals signs and nursing note reviewed.  Constitutional:      Appearance: Normal appearance.  HENT:     Head: Normocephalic.  Neurological:     Mental Status: She is alert and oriented to person, place, and time.  Psychiatric:        Mood and Affect: Mood normal.        Assessment:     Postmenopausal bleeding, needs f/u for thickened endometrium Provera for 2 months    Plan:     Continue provera Repeat US in 2 months, applying for financial assistance RTC 3 months  Woodroe Mode, MD 09/24/2018

## 2018-10-02 ENCOUNTER — Ambulatory Visit
Admission: RE | Admit: 2018-10-02 | Discharge: 2018-10-02 | Disposition: A | Discharge status: Home / Self Care | Payer: No Typology Code available for payment source | Source: Ambulatory Visit | Attending: Obstetrics and Gynecology | Admitting: Obstetrics and Gynecology

## 2018-10-20 MED FILL — risperiDONE 1 MG TABS: 1 | 30 days supply | Qty: 30 | Fill #5

## 2018-10-20 MED FILL — MEDROXYPROGESTERONE ACETATE: 10 | 30 days supply | Qty: 60 | Fill #3

## 2018-10-20 MED FILL — ATORVASTATIN CALCIUM 40 MG: 40 | 30 days supply | Qty: 30 | Fill #3

## 2018-11-10 ENCOUNTER — Ambulatory Visit (INDEPENDENT_AMBULATORY_CARE_PROVIDER_SITE_OTHER): Payer: No Typology Code available for payment source | Admitting: Family Medicine

## 2018-11-24 MED FILL — risperiDONE 1 MG TABS: 1 | 30 days supply | Qty: 30 | Fill #0

## 2018-11-24 MED FILL — MEDROXYPROGESTERONE ACETATE: 10 | 30 days supply | Qty: 60 | Fill #4

## 2018-11-24 MED FILL — ATORVASTATIN CALCIUM 40 MG: 40 | 30 days supply | Qty: 30 | Fill #4

## 2018-11-30 ENCOUNTER — Ambulatory Visit (HOSPITAL_COMMUNITY): Admission: RE | Admit: 2018-11-30 | Payer: Self-pay | Source: Ambulatory Visit

## 2018-12-04 ENCOUNTER — Ambulatory Visit (INDEPENDENT_AMBULATORY_CARE_PROVIDER_SITE_OTHER): Payer: No Typology Code available for payment source

## 2018-12-15 ENCOUNTER — Ambulatory Visit (HOSPITAL_COMMUNITY): Payer: Self-pay

## 2018-12-18 ENCOUNTER — Ambulatory Visit (INDEPENDENT_AMBULATORY_CARE_PROVIDER_SITE_OTHER): Payer: No Typology Code available for payment source | Admitting: Primary Care

## 2018-12-21 ENCOUNTER — Ambulatory Visit (HOSPITAL_COMMUNITY)
Admission: RE | Admit: 2018-12-21 | Discharge: 2018-12-21 | Disposition: A | Discharge status: Home / Self Care | Payer: Self-pay | Source: Ambulatory Visit | Attending: Obstetrics & Gynecology | Admitting: Obstetrics & Gynecology

## 2018-12-21 DIAGNOSIS — R9389 Abnormal findings on diagnostic imaging of other specified body structures: Secondary | ICD-10-CM | POA: Insufficient documentation

## 2018-12-22 ENCOUNTER — Telehealth: Payer: Self-pay

## 2018-12-22 ENCOUNTER — Telehealth: Payer: Self-pay | Admitting: Obstetrics & Gynecology

## 2018-12-22 NOTE — Telephone Encounter (Addendum)
-----   Message from Woodroe Mode, MD sent at 12/22/2018 11:34 AM EDT ----- Endometrial thickness still abnormal,recommend she return for endometrial biopsy  Notified pt of results and explained the procedure of the endo bx. I recommended that the pt take ibuprofen at least 30 min before her appt to help with cramping.  Verified pt's appt for 01/27/19 @ 1355.  Pt stated understanding with no further questions.

## 2018-12-22 NOTE — Telephone Encounter (Signed)
Called patient to inform her of next available appointment with Dr Roselie Awkward. Patient stated date was fine.

## 2018-12-25 ENCOUNTER — Other Ambulatory Visit: Payer: Self-pay | Admitting: Obstetrics & Gynecology

## 2018-12-25 DIAGNOSIS — R9389 Abnormal findings on diagnostic imaging of other specified body structures: Secondary | ICD-10-CM

## 2018-12-25 MED FILL — ATORVASTATIN CALCIUM 40 MG: 40 | 30 days supply | Qty: 30 | Fill #5

## 2018-12-25 MED FILL — risperiDONE 1 MG TABS: 1 | 30 days supply | Qty: 30 | Fill #1

## 2019-01-14 IMAGING — CR DG CHEST 2V
2 series · 2 of 2 positions shown · non-contrast
Comparison: 10/09/2016

CLINICAL DATA: Shortness of breath with productive cough

EXAM:
CHEST  2 VIEW

[chest pa]
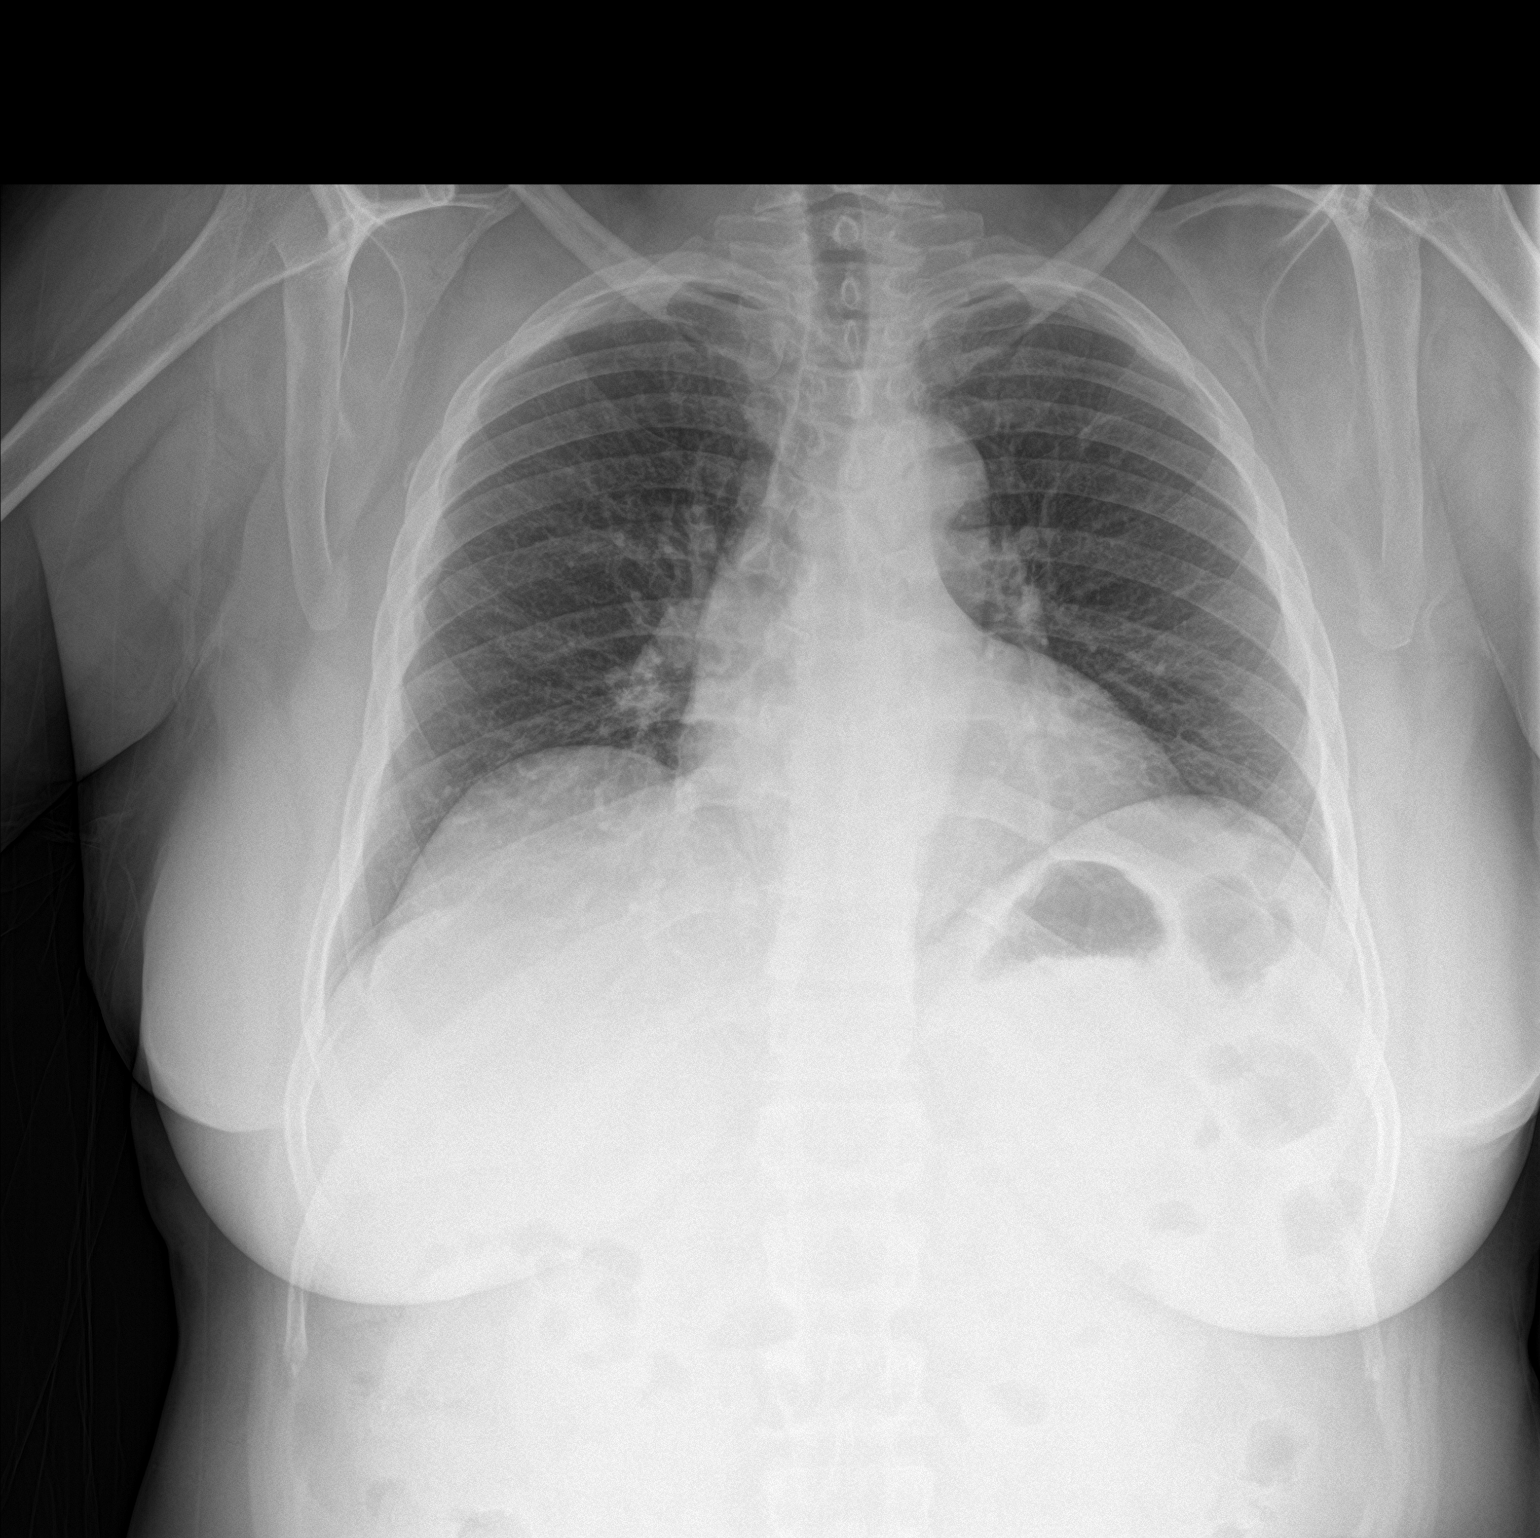

[chest lat]
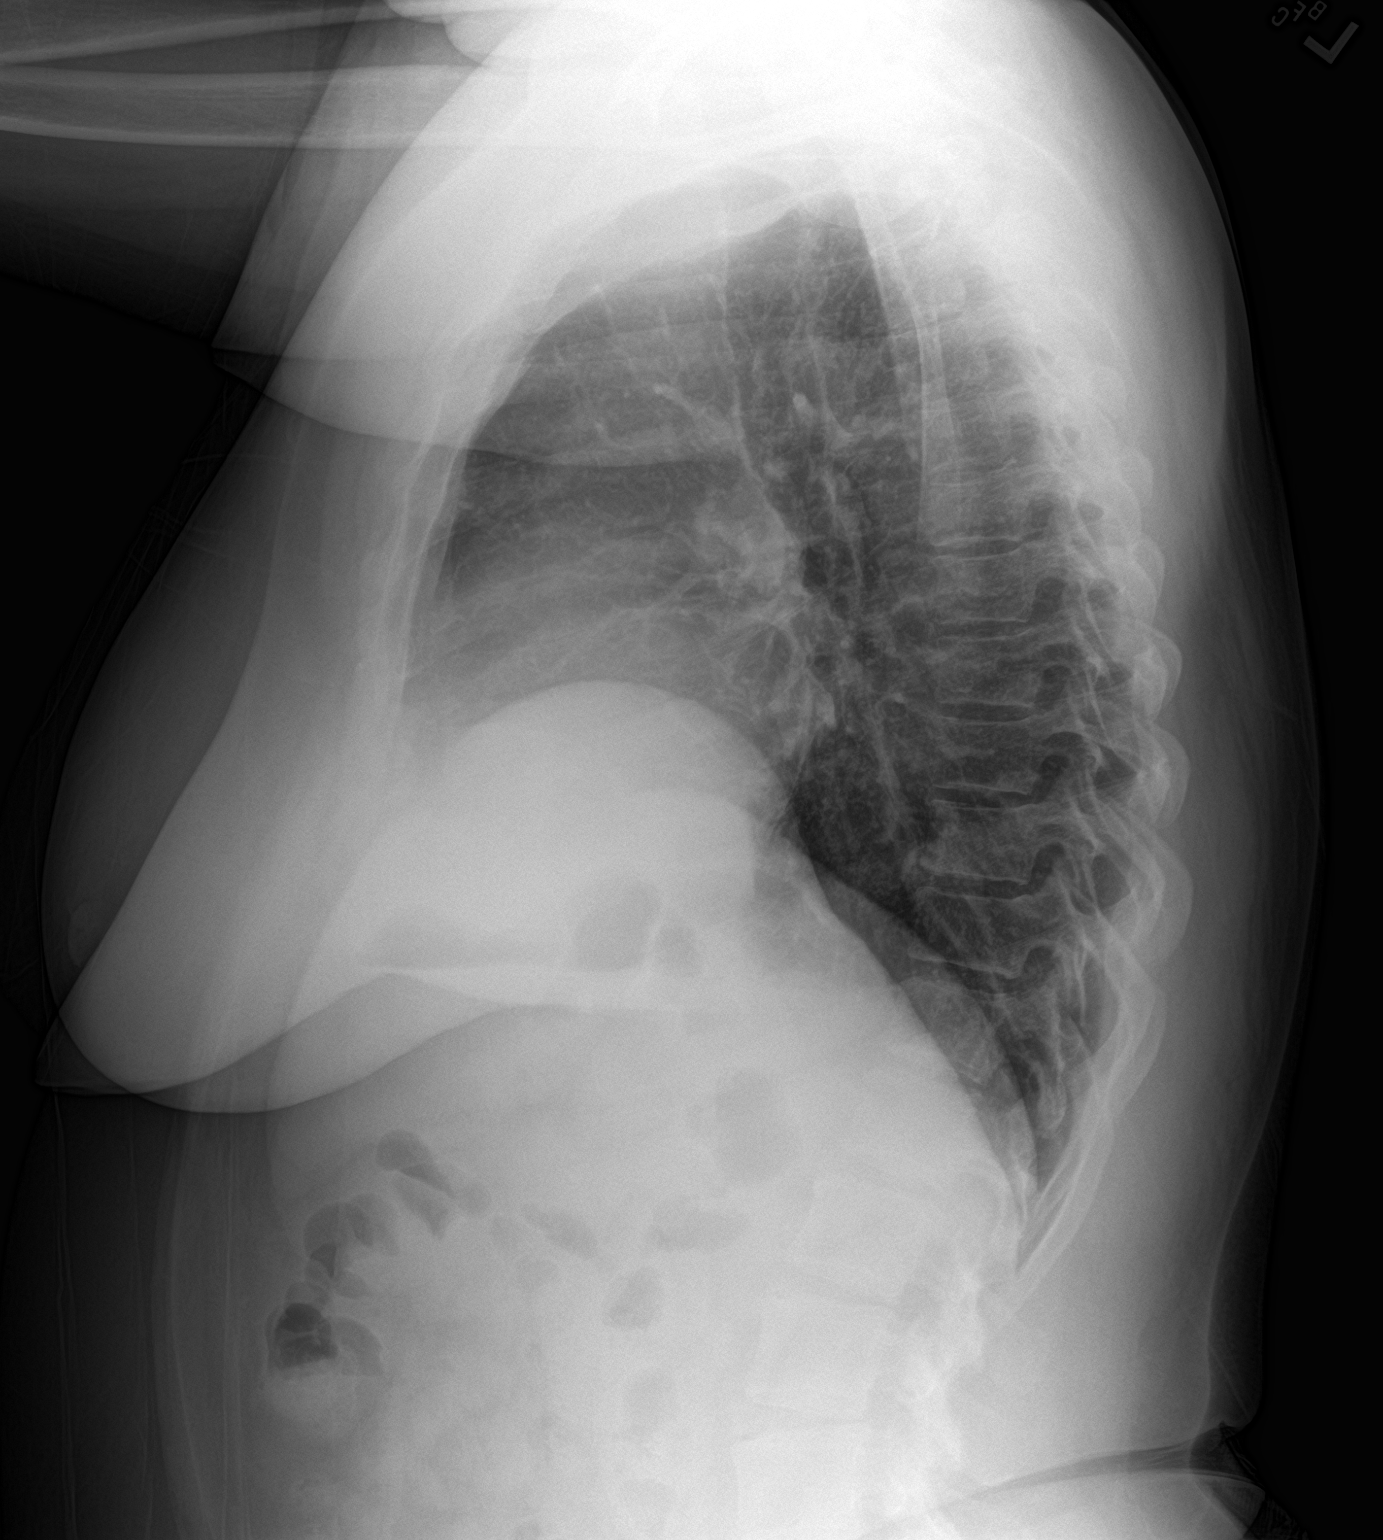

[2 of 2 positions shown; findings below may reference images not displayed]

FINDINGS: The heart size and mediastinal contours are within normal limits.
Both lungs are clear. The visualized skeletal structures are
unremarkable.
IMPRESSION: No active cardiopulmonary disease.

## 2019-01-26 ENCOUNTER — Telehealth: Payer: Self-pay | Admitting: Obstetrics & Gynecology

## 2019-01-26 NOTE — Telephone Encounter (Signed)
Called the patient to inform of new location, stated she is not near a pen. She will call tomorrow for the location when she is on the way.

## 2019-01-27 ENCOUNTER — Ambulatory Visit: Payer: No Typology Code available for payment source | Admitting: Obstetrics & Gynecology

## 2019-01-27 MED FILL — risperiDONE 1 MG TABS: 1 | 30 days supply | Qty: 30 | Fill #2

## 2019-01-27 MED FILL — MEDROXYPROGESTERONE ACETATE: 10 | 30 days supply | Qty: 60 | Fill #0

## 2019-01-27 MED FILL — ATORVASTATIN CALCIUM 40 MG: 40 | 30 days supply | Qty: 30 | Fill #1

## 2019-03-09 ENCOUNTER — Other Ambulatory Visit: Payer: Self-pay | Admitting: Nurse Practitioner

## 2019-03-09 DIAGNOSIS — Z760 Encounter for issue of repeat prescription: Secondary | ICD-10-CM

## 2019-03-09 MED FILL — MEDROXYPROGESTERONE ACETATE: 10 | 30 days supply | Qty: 60 | Fill #1

## 2019-03-09 MED FILL — ATORVASTATIN CALCIUM 40 MG: 40 | 30 days supply | Qty: 30 | Fill #2

## 2019-03-11 ENCOUNTER — Other Ambulatory Visit: Payer: Self-pay | Admitting: Nurse Practitioner

## 2019-03-11 DIAGNOSIS — Z760 Encounter for issue of repeat prescription: Secondary | ICD-10-CM

## 2019-03-22 ENCOUNTER — Ambulatory Visit (INDEPENDENT_AMBULATORY_CARE_PROVIDER_SITE_OTHER): Payer: No Typology Code available for payment source | Admitting: Primary Care

## 2019-03-26 ENCOUNTER — Telehealth: Payer: Self-pay | Admitting: Obstetrics & Gynecology

## 2019-03-26 NOTE — Telephone Encounter (Signed)
Attempted to call patient to ask her about any symptoms, and to wear her mask the whole visit covering her nose and mouth. Also, to sanitize her hands upon arriving. Message was left.

## 2019-03-29 ENCOUNTER — Ambulatory Visit: Payer: No Typology Code available for payment source | Admitting: Obstetrics & Gynecology

## 2019-03-31 ENCOUNTER — Ambulatory Visit (INDEPENDENT_AMBULATORY_CARE_PROVIDER_SITE_OTHER): Payer: No Typology Code available for payment source | Admitting: Primary Care

## 2019-04-08 MED FILL — MEDROXYPROGESTERONE ACETATE: 10 | 30 days supply | Qty: 60 | Fill #2

## 2019-04-14 ENCOUNTER — Other Ambulatory Visit: Payer: Self-pay

## 2019-04-14 ENCOUNTER — Ambulatory Visit (INDEPENDENT_AMBULATORY_CARE_PROVIDER_SITE_OTHER): Payer: Self-pay | Admitting: Primary Care

## 2019-04-14 ENCOUNTER — Encounter (INDEPENDENT_AMBULATORY_CARE_PROVIDER_SITE_OTHER): Payer: Self-pay | Admitting: Primary Care

## 2019-04-14 VITALS — BP 143/97 | HR 87 | Temp 98.3°F | Ht 64.0 in | Wt 187.4 lb

## 2019-04-14 DIAGNOSIS — Z1211 Encounter for screening for malignant neoplasm of colon: Secondary | ICD-10-CM

## 2019-04-14 DIAGNOSIS — R55 Syncope and collapse: Secondary | ICD-10-CM

## 2019-04-14 DIAGNOSIS — E7841 Elevated Lipoprotein(a): Secondary | ICD-10-CM

## 2019-04-14 DIAGNOSIS — Z760 Encounter for issue of repeat prescription: Secondary | ICD-10-CM

## 2019-04-14 DIAGNOSIS — R030 Elevated blood-pressure reading, without diagnosis of hypertension: Secondary | ICD-10-CM

## 2019-04-14 DIAGNOSIS — K5900 Constipation, unspecified: Secondary | ICD-10-CM

## 2019-04-14 MED ORDER — ATORVASTATIN CALCIUM 40 MG PO TABS
40.0000 mg | ORAL_TABLET | Freq: Every day | ORAL | 3 refills | Status: DC
Start: 2019-04-14 — End: 2019-06-30

## 2019-04-14 MED ORDER — POLYETHYLENE GLYCOL 3350 17 GM/SCOOP PO POWD: 17.0000 g | Freq: Two times a day (BID) | ORAL | 1 refills | Status: DC | PRN

## 2019-04-14 MED ORDER — RISPERIDONE 1 MG PO TABS: 1.0000 mg | ORAL_TABLET | Freq: Every day | ORAL | 3 refills | Status: DC

## 2019-04-14 MED FILL — ATORVASTATIN CALCIUM 40 MG: 40 | 30 days supply | Qty: 30 | Fill #0

## 2019-04-14 MED FILL — POLYETHYLENE GLYCOL 3350 PO: 17 | 7 days supply | Qty: 238 | Fill #0

## 2019-04-14 MED FILL — risperiDONE 1 MG TABS: 1 | 30 days supply | Qty: 30 | Fill #0

## 2019-04-14 NOTE — Patient Instructions (Signed)
Syncope °Syncope is when you pass out (faint) for a short time. It is caused by a sudden decrease in blood flow to the brain. Signs that you may be about to pass out include: °· Feeling dizzy or light-headed. °· Feeling sick to your stomach (nauseous). °· Seeing all white or all black. °· Having cold, clammy skin. °If you pass out, get help right away. Call your local emergency services (911 in the [place] Do not drive yourself to the hospital. °Follow these instructions at home: °Watch for any changes in your symptoms. Take these actions to stay safe and help with your symptoms: °Lifestyle °· Do not drive, use machinery, or play sports until your doctor says it is okay. °· Do not drink alcohol. °· Do not use any products that contain nicotine or tobacco, such as cigarettes and e-cigarettes. If you need help quitting, ask your doctor. °· Drink enough fluid to keep your pee (urine) pale yellow. °General instructions °· Take over-the-counter and prescription medicines only as told by your doctor. °· If you are taking blood pressure or heart medicine, sit up and stand up slowly. Spend a few minutes getting ready to sit and then stand. This can help you feel less dizzy. °· Have someone stay with you until you feel stable. °· If you start to feel like you might pass out, lie down right away and raise (elevate) your feet above the level of your heart. Breathe deeply and steadily. Wait until all of the symptoms are gone. °· Keep all follow-up visits as told by your doctor. This is important. °Get help right away if: °· You have a very bad headache. °· You pass out once or more than once. °· You have pain in your chest, belly, or back. °· You have a very fast or uneven heartbeat (palpitations). °· It hurts to breathe. °· You are bleeding from your mouth or your bottom (rectum). °· You have black or tarry poop (stool). °· You have jerky movements that you cannot control (seizure). °· You are confused. °· You have trouble  walking. °· You are very weak. °· You have vision problems. °These symptoms may be an emergency. Do not wait to see if the symptoms will go away. Get medical help right away. Call your local emergency services (911 in the [place] Do not drive yourself to the hospital. °Summary °· Syncope is when you pass out (faint) for a short time. It is caused by a sudden decrease in blood flow to the brain. °· Signs that you may be about to faint include feeling dizzy, light-headed, or sick to your stomach, seeing all white or all black, or having cold, clammy skin. °· If you start to feel like you might pass out, lie down right away and raise (elevate) your feet above the level of your heart. Breathe deeply and steadily. Wait until all of the symptoms are gone. °This information is not intended to replace advice given to you by your health care provider. Make sure you discuss any questions you have with your health care provider. °Document Released: 03/18/2008 Document Revised: 11/12/2017 Document Reviewed: 11/12/2017 °Elsevier Patient Education © 2020 [employer] ° °

## 2019-04-14 NOTE — Progress Notes (Signed)
Established Patient Office Visit  Subjective:  Patient ID: Janet Ramos, female    DOB: 08-Nov-1960  Age: 58 y.o. MRN: [id number]  CC:  Chief Complaint  Patient presents with  . Medication Refill    HPI Janet Ramos presents for for management of chronic disease  headaches, hyperlipidemia, insomnia and bipolar.  Requesting refills.  She is also followed by GYN Dr. Emeterio Reeve for thickened endometrium biopsy was performed and was benign.  Past Medical History:  Diagnosis Date  . 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  STates this is when she started drinking heaviily  . Bipolar 1 disorder ([place])   . Seizures ([place])   . Trichimoniasis 2010    Past Surgical History:  Procedure Laterality Date  . CERVICAL BIOPSY    . CESAREAN SECTION  1990  . ENUCLEATION Left 09/2014   Damage from remote that hit her eye  . EYE SURGERY Right 06/2015   cyst removal from medial canthus  . RUPTURED GLOBE EXPLORATION AND REPAIR Left 09/16/2014   Procedure:  Ruptured Globe Repair Left Eye;  Surgeon: Lamonte Sakai, MD;  Location: [facility];  Service: Ophthalmology;  Laterality: Left;  . TUBAL LIGATION    . tubaligation  1994    Family History  Problem Relation Age of Onset  . Lupus Mother   . Heart disease Mother   . Colon cancer Father 37  . Thyroid disease Daughter   . Bipolar disorder Daughter   . Hypertension Other   . Cancer Other   . CAD Other     Social History   Socioeconomic History  . Marital status: Single    Spouse name: Not on file  . Number of children: 6  . Years of education: Not on file  . Highest education level: Not on file  Occupational History  . Occupation: Nurses Aide in past.    Comment: unemployed since eye injury about 1 year ago.  Social Needs  . Financial resource strain: Not on file  . Food insecurity    Worry: Not on file    Inability: Not on file  . Transportation needs    Medical: Not on file     Non-medical: Not on file  Tobacco Use  . Smoking status: Current Every Day Smoker    Packs/day: 0.50    Start date: 02/24/1976  . Smokeless tobacco: Never Used  . Tobacco comment: Working on it.  Substance and Sexual Activity  . Alcohol use: Yes    Alcohol/week: 8.0 standard drinks    Types: 8 Standard drinks or equivalent per week    Comment: daily use  . Drug use: Yes    Types: Marijuana  . Sexual activity: Not Currently    Birth control/protection: None  Lifestyle  . Physical activity    Days per week: Not on file    Minutes per session: Not on file  . Stress: Not on file  Relationships  . Social Herbalist on phone: Not on file    Gets together: Not on file    Attends religious service: Not on file    Active member of club or organization: Not on file    Attends meetings of clubs or organizations: Not on file    Relationship status: Not on file  . Intimate partner violence    Fear of current or ex partner: Not on file    Emotionally abused:  Not on file    Physically abused: Not on file    Forced sexual activity: Not on file  Other Topics Concern  . Not on file  Social History Narrative   Originally from [place], but moved to [place] at age 74 yo.     Lived with mother in [place].   Younger sister killed there--the man who was thought to be the killer was murdered in front of the patient's home by some of her sister's friends.   Older sister is felt to have been kidnapped by her [place] husband and take to [place]--occurred 5 years ago.   Middle sister infected with HIV from her husband.    Pt. Lives with a female platonic friend.     Outpatient Medications Prior to Visit  Medication Sig Dispense Refill  . acamprosate (CAMPRAL) 333 MG tablet Take 2 tablets (666 mg total) by mouth 3 (three) times daily. (Patient not taking: Reported on 09/01/2018) 180 tablet 2  . atorvastatin (LIPITOR) 40 MG tablet Take 1 tablet (40 mg total) by mouth daily. (Patient  not taking: Reported on 04/14/2019) 30 tablet 5  . medroxyPROGESTERone (PROVERA) 10 MG tablet TAKE 2 TABLETS BY MOUTH DAILY. 60 tablet 4  . naproxen (NAPROSYN) 500 MG tablet Take 1 tablet (500 mg total) by mouth 2 (two) times daily with a meal. 30 tablet 0  . risperiDONE (RISPERDAL) 1 MG tablet Take 1 tablet (1 mg total) by mouth at bedtime. (Patient not taking: Reported on 04/14/2019) 30 tablet 5   No facility-administered medications prior to visit.     No Known Allergies Review of Systems  Constitutional: Negative.   Gastrointestinal: Positive for constipation.  Neurological: Positive for dizziness and weakness.      Objective:    Physical Exam  Constitutional: She is oriented to person, place, and time. She appears well-developed and well-nourished.  HENT:  Head: Normocephalic.  Eyes: EOM are normal.  Left eye blind  Neck: Neck supple.  Cardiovascular: Normal rate and regular rhythm.  Abdominal: Bowel sounds are normal. She exhibits distension.  Musculoskeletal: Normal range of motion.  Neurological: She is oriented to person, place, and time.  Skin: Skin is warm and dry.  Psychiatric: She has a normal mood and affect.    BP (!) 143/97 (BP Location: Left Arm, Patient Position: Sitting, Cuff Size: Normal)   Pulse 87   Temp 98.3 F (36.8 C) (Oral)   Ht 5\' 4"  (1.626 m)   Wt 187 lb 6.4 oz (85 kg)   LMP 03/15/2011   SpO2 93%   BMI 32.17 kg/m  Wt Readings from Last 3 Encounters:  04/14/19 187 lb 6.4 oz (85 kg)  09/24/18 188 lb 3.2 oz (85.4 kg)  09/01/18 194 lb (88 kg)     Health Maintenance Due  Topic Date Due  . Fecal DNA (Cologuard)  02/23/2013    There are no preventive care reminders to display for this patient.  No results found for: TSH Lab Results  Component Value Date   WBC 4.9 04/14/2019   HGB 14.4 04/14/2019   HCT 40.2 04/14/2019   MCV 92 04/14/2019   PLT 61 (LL) 04/14/2019   Lab Results  Component Value Date   NA 139 04/14/2019   K 4.1  04/14/2019   CO2 18 (L) 04/14/2019   GLUCOSE 81 04/14/2019   BUN 5 (L) 04/14/2019   CREATININE 0.56 (L) 04/14/2019   BILITOT 0.7 04/14/2019   ALKPHOS 69 04/14/2019   AST 185 (H) 04/14/2019  ALT 111 (H) 04/14/2019   PROT 7.7 04/14/2019   ALBUMIN 4.2 04/14/2019   CALCIUM 9.1 04/14/2019   ANIONGAP 9 04/20/2018   Lab Results  Component Value Date   CHOL 151 04/14/2019   Lab Results  Component Value Date   HDL 86 04/14/2019   Lab Results  Component Value Date   LDLCALC 52 04/14/2019   Lab Results  Component Value Date   TRIG 63 04/14/2019   Lab Results  Component Value Date   CHOLHDL 1.8 04/14/2019   Lab Results  Component Value Date   HGBA1C 5.3 05/07/2015      Assessment & Plan:   Reign was seen today for medication refill.  Diagnoses and all orders for this visit:  Syncope, unspecified syncope type She denies loss of consciousness or unresponsiveness,, can be cardiac related denies any shortness of breath or tachycardia this can also occur more with the bowel vagal respons, also with dehydration.  Encourage adequate amount of fluids daily recommend 64 ounces of water if no contraindication of congestive heart failure. Unknown etiology will rule out anemia, -     CBC with Differential  Medication refill -     risperiDONE (RISPERDAL) 1 MG tablet; Take 1 tablet (1 mg total) by mouth at bedtime.  Elevated lipoprotein(a) -     atorvastatin (LIPITOR) 40 MG tablet; Take 1 tablet (40 mg total) by mouth daily. -     Lipid Panel  Elevated blood pressure reading in office without diagnosis of hypertension Counseled on blood pressure goal of less than 130/80, low-sodium, DASH diet, medication compliance, 150 minutes of moderate intensity exercise per week. Discussed medication compliance, adverse effects. -     Comprehensive metabolic panel  Constipation, unspecified constipation type -     Fecal occult blood, imunochemical; Future -     polyethylene glycol powder  (GLYCOLAX/MIRALAX) 17 GM/SCOOP powder; Take 17 g by mouth 2 (two) times daily as needed.  Screening for colon cancer Normal colon cancer screening.  CDC recommends colorectal screening from ages 51-75. Screening can begin at 11 or earlier in some cases. This screening is used for a disease when no symptoms are present . Diagnostic test is used for symptoms examples blood in stool, colorectal polyps or coloector cancer, family history or inflammatory bowel disease - chron's or ulcerative colitis .(USPSTF)    Meds ordered this encounter  Medications  . risperiDONE (RISPERDAL) 1 MG tablet    Sig: Take 1 tablet (1 mg total) by mouth at bedtime.    Dispense:  30 tablet    Refill:  3  . atorvastatin (LIPITOR) 40 MG tablet    Sig: Take 1 tablet (40 mg total) by mouth daily.    Dispense:  30 tablet    Refill:  3  . polyethylene glycol powder (GLYCOLAX/MIRALAX) 17 GM/SCOOP powder    Sig: Take 17 g by mouth 2 (two) times daily as needed.    Dispense:  3350 g    Refill:  1    Follow-up: Return in about 3 months (around 07/15/2019) for HTN,.    Kerin Perna, NP

## 2019-04-15 LAB — CBC WITH DIFFERENTIAL/PLATELET
Basophils Absolute: 0 10*3/uL (ref 0.0–0.2)
Basos: 0 %
EOS (ABSOLUTE): 0 10*3/uL (ref 0.0–0.4)
Eos: 1 %
Hematocrit: 40.2 % (ref 34.0–46.6)
Hemoglobin: 14.4 g/dL (ref 11.1–15.9)
Immature Grans (Abs): 0 10*3/uL (ref 0.0–0.1)
Immature Granulocytes: 0 %
Lymphocytes Absolute: 1.5 10*3/uL (ref 0.7–3.1)
Lymphs: 30 %
MCH: 32.9 pg (ref 26.6–33.0)
MCHC: 35.8 g/dL — ABNORMAL HIGH (ref 31.5–35.7)
MCV: 92 fL (ref 79–97)
Monocytes Absolute: 0.5 10*3/uL (ref 0.1–0.9)
Monocytes: 10 %
Neutrophils Absolute: 2.8 10*3/uL (ref 1.4–7.0)
Neutrophils: 59 %
Platelets: 61 10*3/uL — CL (ref 150–450)
RBC: 4.38 x10E6/uL (ref 3.77–5.28)
RDW: 14.8 % (ref 11.7–15.4)
WBC: 4.9 10*3/uL (ref 3.4–10.8)

## 2019-04-15 LAB — LIPID PANEL
Chol/HDL Ratio: 1.8 ratio (ref 0.0–4.4)
Cholesterol, Total: 151 mg/dL (ref 100–199)
HDL: 86 mg/dL (ref >39)
LDL Calculated: 52 mg/dL (ref 0–99)
Triglycerides: 63 mg/dL (ref 0–149)
VLDL Cholesterol Cal: 13 mg/dL (ref 5–40)

## 2019-04-15 LAB — COMPREHENSIVE METABOLIC PANEL
ALT: 111 IU/L — ABNORMAL HIGH (ref 0–32)
AST: 185 IU/L — ABNORMAL HIGH (ref 0–40)
Albumin/Globulin Ratio: 1.2 (ref 1.2–2.2)
Albumin: 4.2 g/dL (ref 3.8–4.9)
Alkaline Phosphatase: 69 IU/L (ref 39–117)
BUN/Creatinine Ratio: 9 (ref 9–23)
BUN: 5 mg/dL — ABNORMAL LOW (ref 6–24)
Bilirubin Total: 0.7 mg/dL (ref 0.0–1.2)
CO2: 18 mmol/L — ABNORMAL LOW (ref 20–29)
Calcium: 9.1 mg/dL (ref 8.7–10.2)
Chloride: 100 mmol/L (ref 96–106)
Creatinine, Ser: 0.56 mg/dL — ABNORMAL LOW (ref 0.57–1.00)
GFR calc Af Amer: 121 mL/min/{1.73_m2} (ref >59)
GFR calc non Af Amer: 105 mL/min/{1.73_m2} (ref >59)
Globulin, Total: 3.5 g/dL (ref 1.5–4.5)
Glucose: 81 mg/dL (ref 65–99)
Potassium: 4.1 mmol/L (ref 3.5–5.2)
Sodium: 139 mmol/L (ref 134–144)
Total Protein: 7.7 g/dL (ref 6.0–8.5)

## 2019-04-21 ENCOUNTER — Telehealth (INDEPENDENT_AMBULATORY_CARE_PROVIDER_SITE_OTHER): Payer: Self-pay

## 2019-04-21 NOTE — Telephone Encounter (Signed)
Patient is aware that liver enzymes are elevated. Advised and encouraged patient to reduce or stop alcohol consumption. Patient expressed understanding and did not have any other questions. Nat Christen, CMA

## 2019-04-21 NOTE — Telephone Encounter (Signed)
-----   Message from Kerin Perna, NP sent at 04/15/2019  5:21 PM EDT ----- Elevated AST/ALT > Thrombocytopenia which is low low platelet  This may all be related to alcoholism  (chronic liver disease)

## 2019-05-12 ENCOUNTER — Other Ambulatory Visit: Payer: Self-pay

## 2019-05-12 ENCOUNTER — Encounter (INDEPENDENT_AMBULATORY_CARE_PROVIDER_SITE_OTHER): Payer: Self-pay | Admitting: Primary Care

## 2019-05-12 ENCOUNTER — Ambulatory Visit (INDEPENDENT_AMBULATORY_CARE_PROVIDER_SITE_OTHER): Payer: No Typology Code available for payment source | Admitting: Primary Care

## 2019-05-12 VITALS — Ht 64.0 in

## 2019-05-12 DIAGNOSIS — F1020 Alcohol dependence, uncomplicated: Secondary | ICD-10-CM

## 2019-05-12 DIAGNOSIS — J301 Allergic rhinitis due to pollen: Secondary | ICD-10-CM

## 2019-05-12 DIAGNOSIS — R058 Other specified cough: Secondary | ICD-10-CM

## 2019-05-12 DIAGNOSIS — R05 Cough: Secondary | ICD-10-CM

## 2019-05-12 DIAGNOSIS — K219 Gastro-esophageal reflux disease without esophagitis: Secondary | ICD-10-CM

## 2019-05-12 MED ORDER — OMEPRAZOLE 20 MG PO CPDR: 20.0000 mg | DELAYED_RELEASE_CAPSULE | Freq: Every day | ORAL | 3 refills | Status: DC

## 2019-05-12 MED ORDER — DM-GUAIFENESIN ER 30-600 MG PO TB12: 1.0000 | ORAL_TABLET | Freq: Two times a day (BID) | ORAL | 1 refills | Status: DC

## 2019-05-12 MED ORDER — LORATADINE 10 MG PO TABS: 10.0000 mg | ORAL_TABLET | Freq: Every day | ORAL | 11 refills | Status: DC

## 2019-05-12 MED FILL — ?OMEPRAZOLE 20 MG CAPSULE D: 20 | 30 days supply | Qty: 30 | Fill #0

## 2019-05-12 NOTE — Progress Notes (Signed)
Pt complains of excess phlegm in throat. She wakes daily at 6 am and is unable to swallow. She has to vomit in order to swallow.  Pt complains of edema on the right side of her neck that comes and goes. Pt complains that she has two lumps on left side of neck that have gone away.

## 2019-05-12 NOTE — Progress Notes (Signed)
Virtual Visit via Telephone Note  I connected with Janet Ramos on 05/12/19 at  2:10 PM EDT by telephone and verified that I am speaking with the correct person using two identifiers.   I discussed the limitations, risks, security and privacy concerns of performing an evaluation and management service by telephone and the availability of in person appointments. I also discussed with the patient that there may be a patient responsible charge related to this service. The patient expressed understanding and agreed to proceed.   History of Present Illness: Janet Ramos is being seen today for complaints of abdominal pain a burning sensation that 6 right under the middle of her breast the pain is worse after eating, she coughs at night and wakes up with a foul taste in her mouth.  She also has complaints of nasal congestion. Past Medical History:  Diagnosis Date  . 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  STates this is when she started drinking heaviily  . Bipolar 1 disorder ([place])   . Seizures ([place])   . Trichimoniasis 2010     Observations/Objective: Review of Systems  HENT: Positive for congestion.   Gastrointestinal: Positive for abdominal pain and heartburn.  Endo/Heme/Allergies: Positive for environmental allergies.  All other systems reviewed and are negative.   Assessment and Plan: Janet Ramos was seen today for nasal congestion.  Diagnoses and all orders for this visit:  Gastroesophageal reflux disease, esophagitis presence not specified Discussed eating small frequent meal, reduction in acidic foods, fried foods ,spicy foods, alcohol caffeine and tobacco and certain medications. Avoid laying down after eating 32mins-1hour, elevated head of the bed.  Productive cough The most common causes of chronic cough in immunocompetent adults include the following: upper airway cough syndrome (UACS), previously referred to as postnasal drip  syndrome (PNDS), which is caused by variety of rhinosinus conditions; (2) asthma; (3) GERD; (4) chronic bronchitis from cigarette smoking or other inhaled environmental irritants; (5) nonasthmatic eosinophilic bronchitis; and (6) bronchiectasis.  These conditions, singly or in combination, have accounted for up to 94% of the causes of chronic cough in prospective studies.  Other conditions have constituted no >6% of the causes in prospective studies These have included bronchogenic carcinoma, chronic interstitial pneumonia, sarcoidosis, left ventricular failure, ACEI-induced cough, and aspiration from a condition associated with pharyngeal dysfunction.   Chronic cough is often simultaneously caused by more than one condition. A single cause has been found from 38 to 82% of the time, multiple causes from 18 to 62%. Multiply caused cough has been the result of three diseases up to 42% of the time.    Alcoholism ([place])  Alcohol is empty calories that are with elevated sugar can cause elevated blood sugars and decrease desires to eat proper dietary meal plain. Increase proteins and eliminate alcohol. Alcohol can not stop abruptly witalcoholic anynomous or [facility] to manage alcohol withdrawals hout having withdrawals   Non-seasonal allergic rhinitis due to pollen  presentation of symptoms which include cough, wheeze, and shortness of breath brought on by characteristic triggers and relieved by bronchodilating medications.  This is caused by from hyper responsiveness with tendency of airways to narrow excessively in response to a stimuli.   Other orders -     dextromethorphan-guaiFENesin (MUCINEX DM) 30-600 MG 12hr tablet; Take 1 tablet by mouth 2 (two) times daily. -     loratadine (CLARITIN) 10 MG tablet; Take 1 tablet (10 mg total) by mouth daily. -  omeprazole (PRILOSEC) 20 MG capsule; Take 1 capsule (20 mg total) by mouth daily.    Follow Up Instructions:    I discussed the assessment and  treatment plan with the patient. The patient was provided an opportunity to ask questions and all were answered. The patient agreed with the plan and demonstrated an understanding of the instructions.   The patient was advised to call back or seek an in-person evaluation if the symptoms worsen or if the condition fails to improve as anticipated.  I provided 18 minutes of non-face-to-face time during this encounter.   Kerin Perna, NP

## 2019-05-13 MED FILL — MEDROXYPROGESTERONE ACETATE: 10 | 30 days supply | Qty: 60 | Fill #3

## 2019-05-13 MED FILL — ?ATORVASTATIN 40MG TABLET: 40 | 30 days supply | Qty: 30 | Fill #1

## 2019-05-13 MED FILL — ?RISPERIDONE 1MG TABS: 1 | 30 days supply | Qty: 30 | Fill #1

## 2019-06-14 MED FILL — MEDROXYPROGESTERONE ACETATE: 10 | 30 days supply | Qty: 60 | Fill #4

## 2019-06-14 MED FILL — ?RISPERIDONE 1MG TABS: 1 | 30 days supply | Qty: 30 | Fill #2

## 2019-06-14 MED FILL — ?ATORVASTATIN 40MG TABLET: 40 | 30 days supply | Qty: 30 | Fill #2

## 2019-06-30 ENCOUNTER — Encounter (INDEPENDENT_AMBULATORY_CARE_PROVIDER_SITE_OTHER): Payer: Self-pay | Admitting: Primary Care

## 2019-06-30 ENCOUNTER — Other Ambulatory Visit: Payer: Self-pay

## 2019-06-30 ENCOUNTER — Ambulatory Visit (INDEPENDENT_AMBULATORY_CARE_PROVIDER_SITE_OTHER): Payer: Self-pay | Admitting: Primary Care

## 2019-06-30 DIAGNOSIS — E7841 Elevated Lipoprotein(a): Secondary | ICD-10-CM

## 2019-06-30 DIAGNOSIS — R058 Other specified cough: Secondary | ICD-10-CM

## 2019-06-30 DIAGNOSIS — R05 Cough: Secondary | ICD-10-CM

## 2019-06-30 DIAGNOSIS — R42 Dizziness and giddiness: Secondary | ICD-10-CM

## 2019-06-30 DIAGNOSIS — Z760 Encounter for issue of repeat prescription: Secondary | ICD-10-CM

## 2019-06-30 DIAGNOSIS — R0602 Shortness of breath: Secondary | ICD-10-CM

## 2019-06-30 MED ORDER — DM-GUAIFENESIN ER 30-600 MG PO TB12: 2.0000 | ORAL_TABLET | Freq: Two times a day (BID) | ORAL | 2 refills | Status: DC

## 2019-06-30 MED ORDER — ATORVASTATIN CALCIUM 40 MG PO TABS: 40.0000 mg | ORAL_TABLET | Freq: Every day | ORAL | 3 refills | Status: DC

## 2019-06-30 MED ORDER — RISPERIDONE 1 MG PO TABS: 1.0000 mg | ORAL_TABLET | Freq: Every day | ORAL | 3 refills | Status: DC

## 2019-06-30 NOTE — Progress Notes (Signed)
Pt states she has recently been losing her balance Pt has not had shivers/shaking in about three days.

## 2019-06-30 NOTE — Progress Notes (Signed)
Virtual Visit via Telephone Note  I connected with Janet Ramos on 06/30/19 at  1:30 PM EDT by telephone and verified that I am speaking with the correct person using two identifiers.   I discussed the limitations, risks, security and privacy concerns of performing an evaluation and management service by telephone and the availability of in person appointments. I also discussed with the patient that there may be a patient responsible charge related to this service. The patient expressed understanding and agreed to proceed.   History of Present Illness: [name] is having an acute visit for complaints of cold, cough , congestion,dizziness  and shortness of breath.   Past Medical History:  Diagnosis Date  . 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  STates this is when she started drinking heaviily  . Bipolar 1 disorder ([place])   . Seizures ([place])   . Trichimoniasis 2010   Observations/Objective: Review of Systems  HENT: Positive for congestion.   Respiratory: Positive for sputum production and shortness of breath.   Musculoskeletal: Positive for falls.  Neurological: Positive for dizziness.  Psychiatric/Behavioral: Positive for memory loss.    Assessment and Plan.  Diagnoses and all orders for this visit: Janet Ramos was seen today for shivers.  Diagnoses and all orders for this visit:  Productive cough The most common causes of chronic cough in immunocompetent adults include the following: upper airway cough syndrome (UACS), previously referred to as postnasal drip syndrome (PNDS), which is caused by variety of rhinosinus conditions; (2) asthma; (3) GERD; (4) chronic bronchitis from cigarette smoking or other inhaled environmental irritants; (5) nonasthmatic eosinophilic bronchitis; and (6) bronchiectasis.   These conditions, singly or in combination, have accounted for up to 94% of the causes of chronic cough in prospective  studies.   Other conditions have constituted no >6% of the causes in prospective studies These have included bronchogenic carcinoma, chronic interstitial pneumonia, sarcoidosis, left ventricular failure, ACEI-induced cough, and aspiration from a condition associated with pharyngeal dysfunction.    Chronic cough is often simultaneously caused by more than one condition. A single cause has been found from 38 to 82% of the time, multiple causes from 18 to 62%. Multiply caused cough has been the result of three diseases up to 42% of the time.   Medication refill -     risperiDONE (RISPERDAL) 1 MG tablet; Take 1 tablet (1 mg total) by mouth at bedtime.  Elevated lipoprotein(a)  Encourage to eat foods low in cholesterol or liver, fatty meats,cheese, butter avocados, nuts and seeds, chocolate and fried foods. -     atorvastatin (LIPITOR) 40 MG tablet; Take 1 tablet (40 mg total) by mouth daily. (Patient taking differently: Take 40 mg by mouth at bedtime. )  Other orders -     dextromethorphan-guaiFENesin (MUCINEX DM) 30-600 MG 12hr tablet; Take 2 tablets by mouth 2 (two) times daily.  Follow Up Instructions:    I discussed the assessment and treatment plan with the patient. The patient was provided an opportunity to ask questions and all were answered. The patient agreed with the plan and demonstrated an understanding of the instructions.   The patient was advised to call back or seek an in-person evaluation if the symptoms worsen or if the condition fails to improve as anticipated.  I provided 16 minutes of non-face-to-face time during this encounter.   Kerin Perna, NP

## 2019-07-04 ENCOUNTER — Other Ambulatory Visit: Payer: Self-pay

## 2019-07-04 ENCOUNTER — Encounter (HOSPITAL_COMMUNITY): Payer: Self-pay

## 2019-07-04 ENCOUNTER — Emergency Department (HOSPITAL_COMMUNITY): Payer: Self-pay

## 2019-07-04 ENCOUNTER — Inpatient Hospital Stay (HOSPITAL_COMMUNITY)
Admission: EM | Admit: 2019-07-04 | Discharge: 2019-07-09 | DRG: 897 | Disposition: A | Discharge status: Home / Self Care | Payer: Self-pay | Attending: Internal Medicine | Admitting: Internal Medicine

## 2019-07-04 DIAGNOSIS — F10231 Alcohol dependence with withdrawal delirium: Principal | ICD-10-CM | POA: Diagnosis present

## 2019-07-04 DIAGNOSIS — E876 Hypokalemia: Secondary | ICD-10-CM | POA: Diagnosis present

## 2019-07-04 DIAGNOSIS — E7800 Pure hypercholesterolemia, unspecified: Secondary | ICD-10-CM | POA: Diagnosis present

## 2019-07-04 DIAGNOSIS — Z79899 Other long term (current) drug therapy: Secondary | ICD-10-CM

## 2019-07-04 DIAGNOSIS — H5462 Unqualified visual loss, left eye, normal vision right eye: Secondary | ICD-10-CM | POA: Diagnosis present

## 2019-07-04 DIAGNOSIS — E538 Deficiency of other specified B group vitamins: Secondary | ICD-10-CM | POA: Diagnosis present

## 2019-07-04 DIAGNOSIS — Z6830 Body mass index (BMI) 30.0-30.9, adult: Secondary | ICD-10-CM

## 2019-07-04 DIAGNOSIS — D6959 Other secondary thrombocytopenia: Secondary | ICD-10-CM | POA: Diagnosis present

## 2019-07-04 DIAGNOSIS — K7030 Alcoholic cirrhosis of liver without ascites: Secondary | ICD-10-CM | POA: Diagnosis present

## 2019-07-04 DIAGNOSIS — Z791 Long term (current) use of non-steroidal anti-inflammatories (NSAID): Secondary | ICD-10-CM

## 2019-07-04 DIAGNOSIS — J449 Chronic obstructive pulmonary disease, unspecified: Secondary | ICD-10-CM | POA: Diagnosis present

## 2019-07-04 DIAGNOSIS — R55 Syncope and collapse: Secondary | ICD-10-CM | POA: Diagnosis present

## 2019-07-04 DIAGNOSIS — Z20828 Contact with and (suspected) exposure to other viral communicable diseases: Secondary | ICD-10-CM | POA: Diagnosis present

## 2019-07-04 DIAGNOSIS — Z8249 Family history of ischemic heart disease and other diseases of the circulatory system: Secondary | ICD-10-CM

## 2019-07-04 DIAGNOSIS — F10939 Alcohol use, unspecified with withdrawal, unspecified: Secondary | ICD-10-CM | POA: Diagnosis present

## 2019-07-04 DIAGNOSIS — G629 Polyneuropathy, unspecified: Secondary | ICD-10-CM | POA: Diagnosis present

## 2019-07-04 DIAGNOSIS — Y906 Blood alcohol level of 120-199 mg/100 ml: Secondary | ICD-10-CM | POA: Diagnosis present

## 2019-07-04 DIAGNOSIS — W19XXXA Unspecified fall, initial encounter: Secondary | ICD-10-CM | POA: Diagnosis present

## 2019-07-04 DIAGNOSIS — F10239 Alcohol dependence with withdrawal, unspecified: Secondary | ICD-10-CM | POA: Diagnosis present

## 2019-07-04 DIAGNOSIS — Z800 Family history of malignant neoplasm of digestive organs: Secondary | ICD-10-CM

## 2019-07-04 DIAGNOSIS — K7460 Unspecified cirrhosis of liver: Secondary | ICD-10-CM

## 2019-07-04 DIAGNOSIS — R443 Hallucinations, unspecified: Secondary | ICD-10-CM | POA: Diagnosis not present

## 2019-07-04 DIAGNOSIS — R296 Repeated falls: Secondary | ICD-10-CM | POA: Diagnosis present

## 2019-07-04 DIAGNOSIS — I2510 Atherosclerotic heart disease of native coronary artery without angina pectoris: Secondary | ICD-10-CM | POA: Diagnosis present

## 2019-07-04 DIAGNOSIS — G40909 Epilepsy, unspecified, not intractable, without status epilepticus: Secondary | ICD-10-CM | POA: Diagnosis present

## 2019-07-04 DIAGNOSIS — E785 Hyperlipidemia, unspecified: Secondary | ICD-10-CM | POA: Diagnosis present

## 2019-07-04 DIAGNOSIS — F319 Bipolar disorder, unspecified: Secondary | ICD-10-CM | POA: Diagnosis present

## 2019-07-04 DIAGNOSIS — F1910 Other psychoactive substance abuse, uncomplicated: Secondary | ICD-10-CM | POA: Diagnosis present

## 2019-07-04 DIAGNOSIS — F17210 Nicotine dependence, cigarettes, uncomplicated: Secondary | ICD-10-CM | POA: Diagnosis present

## 2019-07-04 DIAGNOSIS — R030 Elevated blood-pressure reading, without diagnosis of hypertension: Secondary | ICD-10-CM | POA: Diagnosis present

## 2019-07-04 DIAGNOSIS — R32 Unspecified urinary incontinence: Secondary | ICD-10-CM | POA: Diagnosis present

## 2019-07-04 DIAGNOSIS — Z832 Family history of diseases of the blood and blood-forming organs and certain disorders involving the immune mechanism: Secondary | ICD-10-CM

## 2019-07-04 DIAGNOSIS — E669 Obesity, unspecified: Secondary | ICD-10-CM | POA: Diagnosis present

## 2019-07-04 LAB — RAPID URINE DRUG SCREEN, HOSP PERFORMED
Amphetamines: NOT DETECTED
Barbiturates: NOT DETECTED
Benzodiazepines: NOT DETECTED
Cocaine: NOT DETECTED
Opiates: NOT DETECTED
Tetrahydrocannabinol: POSITIVE — AB

## 2019-07-04 LAB — CBC WITH DIFFERENTIAL/PLATELET
Abs Immature Granulocytes: 0.02 10*3/uL (ref 0.00–0.07)
Basophils Absolute: 0 10*3/uL (ref 0.0–0.1)
Basophils Relative: 1 %
Eosinophils Absolute: 0.1 10*3/uL (ref 0.0–0.5)
Eosinophils Relative: 2 %
HCT: 39.6 % (ref 36.0–46.0)
Hemoglobin: 14.8 g/dL (ref 12.0–15.0)
Immature Granulocytes: 0 %
Lymphocytes Relative: 28 %
Lymphs Abs: 1.4 10*3/uL (ref 0.7–4.0)
MCH: 35.1 pg — ABNORMAL HIGH (ref 26.0–34.0)
MCHC: 37.4 g/dL — ABNORMAL HIGH (ref 30.0–36.0)
MCV: 93.8 fL (ref 80.0–100.0)
Monocytes Absolute: 0.5 10*3/uL (ref 0.1–1.0)
Monocytes Relative: 11 %
Neutro Abs: 2.8 10*3/uL (ref 1.7–7.7)
Neutrophils Relative %: 58 %
Platelets: 100 10*3/uL — ABNORMAL LOW (ref 150–400)
RBC: 4.22 MIL/uL (ref 3.87–5.11)
RDW: 14 % (ref 11.5–15.5)
WBC: 4.8 10*3/uL (ref 4.0–10.5)
nRBC: 0 % (ref 0.0–0.2)

## 2019-07-04 LAB — COMPREHENSIVE METABOLIC PANEL
ALT: 70 U/L — ABNORMAL HIGH (ref 0–44)
AST: 112 U/L — ABNORMAL HIGH (ref 15–41)
Albumin: 3.6 g/dL (ref 3.5–5.0)
Alkaline Phosphatase: 58 U/L (ref 38–126)
Anion gap: 16 — ABNORMAL HIGH (ref 5–15)
BUN: <5 mg/dL — ABNORMAL LOW (ref 6–20)
CO2: 17 mmol/L — ABNORMAL LOW (ref 22–32)
Calcium: 8.8 mg/dL — ABNORMAL LOW (ref 8.9–10.3)
Chloride: 100 mmol/L (ref 98–111)
Creatinine, Ser: 0.67 mg/dL (ref 0.44–1.00)
GFR calc Af Amer: >60 mL/min (ref >60)
GFR calc non Af Amer: >60 mL/min (ref >60)
Glucose, Bld: 96 mg/dL (ref 70–99)
Potassium: 4.6 mmol/L (ref 3.5–5.1)
Sodium: 133 mmol/L — ABNORMAL LOW (ref 135–145)
Total Bilirubin: 0.9 mg/dL (ref 0.3–1.2)
Total Protein: 7.9 g/dL (ref 6.5–8.1)

## 2019-07-04 LAB — ETHANOL: Alcohol, Ethyl (B): 180 mg/dL — ABNORMAL HIGH (ref <10)

## 2019-07-04 LAB — URINALYSIS, ROUTINE W REFLEX MICROSCOPIC
Bilirubin Urine: NEGATIVE
Glucose, UA: NEGATIVE mg/dL
Hgb urine dipstick: NEGATIVE
Ketones, ur: NEGATIVE mg/dL
Leukocytes,Ua: NEGATIVE
Nitrite: NEGATIVE
Protein, ur: NEGATIVE mg/dL
Specific Gravity, Urine: 1.005 (ref 1.005–1.030)
pH: 5 (ref 5.0–8.0)

## 2019-07-04 LAB — AMMONIA: Ammonia: 30 umol/L (ref 9–35)

## 2019-07-04 LAB — TSH: TSH: 1.681 u[IU]/mL (ref 0.350–4.500)

## 2019-07-04 LAB — PROTIME-INR
INR: 1.1 (ref 0.8–1.2)
Prothrombin Time: 14.2 seconds (ref 11.4–15.2)

## 2019-07-04 LAB — VITAMIN B12: Vitamin B-12: 212 pg/mL (ref 180–914)

## 2019-07-04 MED ORDER — SODIUM CHLORIDE 0.9 % IV BOLUS
1000.0000 mL | Freq: Once | INTRAVENOUS | Status: AC
  Administered 2019-07-04: 21:00:00 1000 mL via INTRAVENOUS

## 2019-07-04 NOTE — ED Triage Notes (Signed)
[facility] reports pt +LOC for 3-4 minutes, with incontinence, diaphoresis when getting hair done.  Pt had recent fall with tenderness to the right orbit. She is + ETOH with a six pack norm and recently dx cirrhosis. cbg [id number]

## 2019-07-04 NOTE — ED Provider Notes (Signed)
[facility] EMERGENCY DEPARTMENT Provider Note   CSN: [id number] Arrival date & time: 07/04/19  2000     History   Chief Complaint Chief Complaint  Patient presents with   Loss of Consciousness    HPI Janet Ramos is a 58 y.o. female.     HPI Patient presents to the emergency department with syncopal episodes.  The patient has been having these pretty frequently over the last few weeks.  Patient states when she stands up she feels like that is what it occurs most often.  Patient states that nothing seems to make the condition better or worse.  She states that she was at her cousin's house tonight when she had this happen.  The patient was recently diagnosed in the last few months with cirrhosis.  The patient does drink fairly heavily.  The patient denies chest pain, shortness of breath, headache,blurred vision, neck pain, fever, cough, weakness, numbness, dizziness, anorexia, edema, abdominal pain, nausea, vomiting, diarrhea, rash, back pain, dysuria, hematemesis, bloody stool. 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  STates this is when she started drinking heaviily   Bipolar 1 disorder ([place])    Seizures ([place])    Trichimoniasis 2010    Patient Active Problem List   Diagnosis Date Noted   Thickened endometrium 02/09/2018   Substance induced mood disorder ([place]) 10/31/2017   Left knee pain 08/28/2015   Headache 05/08/2015   Hyperlipidemia 05/08/2015   Hypokalemia 05/08/2015   Right sided weakness 05/07/2015   Syncope 05/07/2015   Polysubstance abuse ([place]) 05/07/2015   Rupture of globe 09/16/2014    Past Surgical History:  Procedure Laterality Date   CERVICAL BIOPSY     CESAREAN SECTION  1990   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 Authorizing Provider  atorvastatin (LIPITOR) 40 MG tablet Take 1 tablet (40 mg total) by mouth daily. Patient taking differently: Take 40 mg by mouth at bedtime.  06/30/19  Yes Kerin Perna, NP  medroxyPROGESTERone (PROVERA) 10 MG tablet Take 20 mg by mouth at bedtime.   Yes [provider]  Multiple Vitamin (MULTIVITAMIN WITH MINERALS) TABS tablet Take 1 tablet by mouth every morning. Centrum over 50   Yes [provider]  naproxen sodium (ALEVE) 220 MG tablet Take 220 mg by mouth daily as needed (pain).   Yes [provider]  risperiDONE (RISPERDAL) 1 MG tablet Take 1 tablet (1 mg total) by mouth  at bedtime. 06/30/19  Yes Kerin Perna, NP  dextromethorphan-guaiFENesin (MUCINEX DM) 30-600 MG 12hr tablet Take 2 tablets by mouth 2 (two) times daily. 06/30/19   Kerin Perna, NP  loratadine (CLARITIN) 10 MG tablet Take 1 tablet (10 mg total) by mouth daily. Patient not taking: Reported on 06/30/2019 05/12/19   Kerin Perna, NP  omeprazole (PRILOSEC) 20 MG capsule Take 1 capsule (20 mg total) by mouth daily. Patient not taking: Reported on 06/30/2019 05/12/19   Kerin Perna, NP  polyethylene glycol powder (GLYCOLAX/MIRALAX) 17 GM/SCOOP powder Take 17 g by mouth 2 (two) times daily as needed. Patient not taking: Reported on 07/04/2019 04/14/19   Kerin Perna, NP    Family History Family History  Problem Relation Age of Onset   Lupus Mother     Heart disease Mother    Colon cancer Father 45   Thyroid disease Daughter    Bipolar disorder Daughter    Hypertension Other    Cancer Other    CAD Other     Social History Social History   Tobacco Use   Smoking status: Current Every Day Smoker    Packs/day: 0.50    Start date: 02/24/1976   Smokeless tobacco: Never Used   Tobacco comment: Working on it.  Substance Use Topics   Alcohol use: Yes    Alcohol/week: 8.0 standard drinks    Types: 8 Standard drinks or equivalent per week    Comment: daily use   Drug use: Yes    Types: Marijuana     Allergies   Patient has no known allergies.   Review of Systems Review of Systems  All other systems negative except as documented in the HPI. All pertinent positives and negatives as reviewed in the HPI. Physical Exam Updated Vital Signs BP (!) 149/98    Pulse 91    Temp 98.1 F (36.7 C) (Oral)    Resp (!) 23    LMP 03/15/2011    SpO2 97%   Physical Exam Vitals signs and nursing note reviewed.  Constitutional:      General: She is not in acute distress.    Appearance: She is well-developed.  HENT:     Head: Normocephalic and atraumatic.  Eyes:     Pupils: Pupils are equal, round, and reactive to light.  Neck:     Musculoskeletal: Normal range of motion and neck supple.  Cardiovascular:     Rate and Rhythm: Normal rate and regular rhythm.     Heart sounds: Normal heart sounds. No murmur. No friction rub. No gallop.   Pulmonary:     Effort: Pulmonary effort is normal. No respiratory distress.     Breath sounds: Normal breath sounds. No wheezing.  Abdominal:     General: Bowel sounds are normal. There is no distension.     Palpations: Abdomen is soft.     Tenderness: There is no abdominal tenderness.  Skin:    General: Skin is warm and dry.     Capillary Refill: Capillary refill takes less than 2 seconds.     Findings: No erythema or rash.  Neurological:     Mental Status: She is alert and oriented to  person, place, and time.     Sensory: No sensory deficit.     Motor: No weakness or abnormal muscle tone.     Coordination: Coordination normal.  Psychiatric:        Behavior: Behavior normal.      ED Treatments /  Results  Labs (all labs ordered are listed, but only abnormal results are displayed) Labs Reviewed  COMPREHENSIVE METABOLIC PANEL - Abnormal; Notable for the following components:      Result Value   Sodium 133 (*)    CO2 17 (*)    BUN <5 (*)    Calcium 8.8 (*)    AST 112 (*)    ALT 70 (*)    Anion gap 16 (*)    All other components within normal limits  CBC WITH DIFFERENTIAL/PLATELET - Abnormal; Notable for the following components:   MCH 35.1 (*)    MCHC 37.4 (*)    Platelets 100 (*)    All other components within normal limits  ETHANOL - Abnormal; Notable for the following components:   Alcohol, Ethyl (B) 180 (*)    All other components within normal limits  RAPID URINE DRUG SCREEN, HOSP PERFORMED - Abnormal; Notable for the following components:   Tetrahydrocannabinol POSITIVE (*)    All other components within normal limits  URINALYSIS, ROUTINE W REFLEX MICROSCOPIC  PROTIME-INR  AMMONIA  VITAMIN B12  TSH  VITAMIN B1    EKG EKG Interpretation  Date/Time:  Sunday July 04 2019 21:18:57 EDT Ventricular Rate:  84 PR Interval:    QRS Duration: 96 QT Interval:  366 QTC Calculation: 433 R Axis:   33 Text Interpretation:  Sinus rhythm Low voltage, precordial leads When compared to prior, no significant changes seen.  No STEMI Confirmed by Antony Blackbird [id number]) on 07/04/2019 9:31:47 PM   Radiology Ct Head Wo Contrast  Result Date: 07/04/2019 CLINICAL DATA:  LOC, diaphoresis with fall EXAM: CT HEAD WITHOUT CONTRAST CT CERVICAL SPINE WITHOUT CONTRAST TECHNIQUE: Multidetector CT imaging of the head and cervical spine was performed following the standard protocol without intravenous contrast. Multiplanar CT image reconstructions of the cervical spine  were also generated. COMPARISON:  CT brain 07/28/2016, 07/19/2017, 10/31/2017 FINDINGS: CT HEAD FINDINGS Brain: No acute territorial infarction, hemorrhage, or intracranial mass. The ventricles are nonenlarged. Mild sulcal prominence consistent with atrophy. Vascular: No hyperdense vessels. Scattered carotid vascular calcification Skull: Normal. Negative for fracture or focal lesion. Sinuses/Orbits: Old fracture deformity medial wall left orbit. Chronic left globe prosthetic Other: None CT CERVICAL SPINE FINDINGS Alignment: Straightening of the cervical spine. No subluxation. Facet alignment within normal limits. Skull base and vertebrae: No acute fracture. No primary bone lesion or focal pathologic process. Soft tissues and spinal canal: No prevertebral fluid or swelling. No visible canal hematoma. Disc levels: Mild diffuse degenerative changes C3 through C7 with anterior osteophytes and multiple level minimal disc space narrowing. Upper chest: Negative. Other: Negative IMPRESSION: 1. Negative non contrasted CT appearance of the brain. 2. Straightening of the cervical spine. No acute osseous abnormality Electronically Signed   By: Donavan Foil M.D.   On: 07/04/2019 21:05   Ct Cervical Spine Wo Contrast  Result Date: 07/04/2019 CLINICAL DATA:  LOC, diaphoresis with fall EXAM: CT HEAD WITHOUT CONTRAST CT CERVICAL SPINE WITHOUT CONTRAST TECHNIQUE: Multidetector CT imaging of the head and cervical spine was performed following the standard protocol without intravenous contrast. Multiplanar CT image reconstructions of the cervical spine were also generated. COMPARISON:  CT brain 07/28/2016, 07/19/2017, 10/31/2017 FINDINGS: CT HEAD FINDINGS Brain: No acute territorial infarction, hemorrhage, or intracranial mass. The ventricles are nonenlarged. Mild sulcal prominence consistent with atrophy. Vascular: No hyperdense vessels. Scattered carotid vascular calcification Skull: Normal. Negative for fracture or focal  lesion. Sinuses/Orbits: Old fracture deformity medial wall left orbit. Chronic left globe  prosthetic Other: None CT CERVICAL SPINE FINDINGS Alignment: Straightening of the cervical spine. No subluxation. Facet alignment within normal limits. Skull base and vertebrae: No acute fracture. No primary bone lesion or focal pathologic process. Soft tissues and spinal canal: No prevertebral fluid or swelling. No visible canal hematoma. Disc levels: Mild diffuse degenerative changes C3 through C7 with anterior osteophytes and multiple level minimal disc space narrowing. Upper chest: Negative. Other: Negative IMPRESSION: 1. Negative non contrasted CT appearance of the brain. 2. Straightening of the cervical spine. No acute osseous abnormality Electronically Signed   By: Donavan Foil M.D.   On: 07/04/2019 21:05    Procedures Procedures (including critical care time)  Medications Ordered in ED Medications  sodium chloride 0.9 % bolus 1,000 mL (0 mLs Intravenous Stopped 07/04/19 2239)     Initial Impression / Assessment and Plan / ED Course  I have reviewed the triage vital signs and the nursing notes.  Pertinent labs & imaging results that were available during my care of the patient were reviewed by me and considered in my medical decision making (see chart for details).        Patient will be admitted to the hospital for syncope versus seizure type work-up.  Did get neurology involved early in the patient's ED course and they evaluated her and ordered some testing as well.  Spoke with the [facility] will admit the patient for further evaluation of these episodes.  Final Clinical Impressions(s) / ED Diagnoses   Final diagnoses:  Syncope and collapse    ED Discharge Orders    None       Dalia Heading, PA-C 07/04/19 2349    Tegeler, Gwenyth Allegra, MD 07/05/19 0004

## 2019-07-04 NOTE — Consult Note (Addendum)
Neurology Consultation Reason for Consult: Syncope Referring Physician: Tegeler, C  CC: Syncope  History is obtained from: Patient  HPI: Janet Ramos is a 58 y.o. female with a history of bipolar disorder, alcoholism, who presents with recurrent episodes of falling, unsteady gait, and then tonight loss of consciousness.  I do not have an eyewitness description of the loss of consciousness, but apparently the patient became unresponsive and urinated on herself.  I did discuss with both her and her boyfriend about previous episodes, however.  She states that there have been times where she has fallen without memory of her fall.  She states that it usually happens shortly after she stands up.  She frequently becomes lightheaded shortly after standing up.  She also reports that she feels unsteady when walking, just off balance.  She has had several falls and is not sure why.  Most of the time she just states that she feels lightheaded and off-balance, but sometimes she does not remember her falls.  She has not had any jerking or convulsive activity with any of these episodes.    She has had some mouth twitching which is been going on for several months.  Of note she has been on risperidone for approximately 1 year.  Though there is a history of seizures mentioned in her chart, she denies this.   Of note, she does drink quite heavily, though she has been trying to cut down over the past month.  Over the past 2 days, however she has drank about 12 bud ices per day, then today, she had a single 40.  He notes that she has had some problem with short-term memory recently as well.   ROS: A 14 point ROS was performed and is negative except as noted in the HPI.   Past Medical History:  Diagnosis Date  . 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  STates this is when she started drinking heaviily  . Bipolar 1 disorder ([place])   . Seizures ([place])   .  Trichimoniasis 2010     Family History  Problem Relation Age of Onset  . Lupus Mother   . Heart disease Mother   . Colon cancer Father 6  . Thyroid disease Daughter   . Bipolar disorder Daughter   . Hypertension Other   . Cancer Other   . CAD Other      Social History:  reports that she has been smoking. She started smoking about 43 years ago. She has been smoking about 0.50 packs per day. She has never used smokeless tobacco. She reports current alcohol use of about 8.0 standard drinks of alcohol per week. She reports current drug use. Drug: Marijuana.   Exam: Current vital signs: BP (!) 132/99   Pulse 83   Temp 98.1 F (36.7 C) (Oral)   Resp (!) 36   LMP 03/15/2011   SpO2 95%  Vital signs in last 24 hours: Temp:  [98.1 F (36.7 C)] 98.1 F (36.7 C) (09/20 2014) Pulse Rate:  [83] 83 (09/20 2015) Resp:  [36] 36 (09/20 2015) BP: (132)/(99-101) 132/99 (09/20 2034) SpO2:  [95 %] 95 % (09/20 2015)   Physical Exam  Constitutional: Appears well-developed and well-nourished.  Psych: Affect appropriate to situation Eyes: No scleral injection HENT: No OP obstrucion Head: Normocephalic.  Cardiovascular: Normal rate and regular rhythm.  Respiratory: Effort normal, non-labored breathing GI: Soft.  No distension. There is no tenderness.  Skin: WDI  Neuro: Mental Status: Patient is awake, alert, oriented to person, place, month, year, and situation. Patient is able to give a clear and coherent history. No signs of aphasia or neglect Cranial Nerves: II: Visual Fields are full. Pupils are equal, round, and reactive to light.   III,IV, VI: EOMI without ptosis or diploplia.  V: Facial sensation is symmetric to temperature VII: Facial movement is symmetric.  VIII: hearing is intact to voice X: Uvula elevates symmetrically XI: Shoulder shrug is symmetric. XII: tongue is midline without atrophy or fasciculations.  Motor: Tone is normal. Bulk is normal. 5/5 strength was  present in all four extremities.  Sensory: Sensation is decreased to the mid-calf to temp Deep Tendon Reflexes: 2+ and symmetric in the biceps and decreased at the patellae Cerebellar: She has mild intentional tremor bilaterally, worse on the left than the right.  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  I have reviewed labs in epic and the results pertinent to this consultation are: UDS positive for marijuana   I have reviewed the images obtained:   Impression: 57 year old female with a history of EtOH, bipolar disorder who presents with recurrent episodes of syncope.  From description, sound most like orthostatic syncope, but would favor EEG to assess for seizure predisposition.  The difficulty walking and short-term memory loss could be simply related to her alcoholism and neuropathy, but I would like to rule out B1 deficiency as a possible etiology as well.  Also I would evaluate for the thyroid deficiency and B12 deficiency.  Her mouth movements I think are most likely tardive dyskinesia.  She should discuss this with her psychiatrist to consider changing her medication.  Recommendations: 1) thiamine, B12, TSH 2) thiamine repletion 3) MRI brain without contrast 4) EEG 5) orthostatic vital signs    Roland Rack, MD [facility] [phone]  If 7pm- 7am, please page neurology on call as listed in [place].

## 2019-07-04 NOTE — ED Notes (Signed)
EDP called to room as EMS was giving report to assess pt. EDP and RN contacted CT to expedite. RN transported pt to CT.

## 2019-07-04 NOTE — ED Notes (Signed)
Pt reminded of the need for urine.  

## 2019-07-05 ENCOUNTER — Observation Stay (HOSPITAL_COMMUNITY): Payer: Self-pay

## 2019-07-05 DIAGNOSIS — E785 Hyperlipidemia, unspecified: Secondary | ICD-10-CM

## 2019-07-05 DIAGNOSIS — R55 Syncope and collapse: Secondary | ICD-10-CM

## 2019-07-05 LAB — CBC
HCT: 36.1 % (ref 36.0–46.0)
HCT: 36.1 % (ref 36.0–46.0)
Hemoglobin: 13.3 g/dL (ref 12.0–15.0)
Hemoglobin: 13.2 g/dL (ref 12.0–15.0)
MCH: 34.5 pg — ABNORMAL HIGH (ref 26.0–34.0)
MCH: 34.4 pg — ABNORMAL HIGH (ref 26.0–34.0)
MCHC: 36.8 g/dL — ABNORMAL HIGH (ref 30.0–36.0)
MCHC: 36.6 g/dL — ABNORMAL HIGH (ref 30.0–36.0)
MCV: 93.8 fL (ref 80.0–100.0)
MCV: 94 fL (ref 80.0–100.0)
Platelets: 83 10*3/uL — ABNORMAL LOW (ref 150–400)
Platelets: 78 10*3/uL — ABNORMAL LOW (ref 150–400)
RBC: 3.85 MIL/uL — ABNORMAL LOW (ref 3.87–5.11)
RBC: 3.84 MIL/uL — ABNORMAL LOW (ref 3.87–5.11)
RDW: 13.9 % (ref 11.5–15.5)
RDW: 13.8 % (ref 11.5–15.5)
WBC: 4.2 10*3/uL (ref 4.0–10.5)
WBC: 3.8 10*3/uL — ABNORMAL LOW (ref 4.0–10.5)
nRBC: 0 % (ref 0.0–0.2)
nRBC: 0 % (ref 0.0–0.2)

## 2019-07-05 LAB — CBG MONITORING, ED: Glucose-Capillary: 106 mg/dL — ABNORMAL HIGH (ref 70–99)

## 2019-07-05 LAB — COMPREHENSIVE METABOLIC PANEL
ALT: 62 U/L — ABNORMAL HIGH (ref 0–44)
AST: 90 U/L — ABNORMAL HIGH (ref 15–41)
Albumin: 3.3 g/dL — ABNORMAL LOW (ref 3.5–5.0)
Alkaline Phosphatase: 53 U/L (ref 38–126)
Anion gap: 11 (ref 5–15)
BUN: <5 mg/dL — ABNORMAL LOW (ref 6–20)
CO2: 19 mmol/L — ABNORMAL LOW (ref 22–32)
Calcium: 9.2 mg/dL (ref 8.9–10.3)
Chloride: 107 mmol/L (ref 98–111)
Creatinine, Ser: 0.6 mg/dL (ref 0.44–1.00)
GFR calc Af Amer: >60 mL/min (ref >60)
GFR calc non Af Amer: >60 mL/min (ref >60)
Glucose, Bld: 98 mg/dL (ref 70–99)
Potassium: 3.3 mmol/L — ABNORMAL LOW (ref 3.5–5.1)
Sodium: 137 mmol/L (ref 135–145)
Total Bilirubin: 0.8 mg/dL (ref 0.3–1.2)
Total Protein: 7.4 g/dL (ref 6.5–8.1)

## 2019-07-05 LAB — MAGNESIUM: Magnesium: 1.6 mg/dL — ABNORMAL LOW (ref 1.7–2.4)

## 2019-07-05 LAB — CREATININE, SERUM
Creatinine, Ser: 0.6 mg/dL (ref 0.44–1.00)
GFR calc Af Amer: >60 mL/min (ref >60)
GFR calc non Af Amer: >60 mL/min (ref >60)

## 2019-07-05 LAB — VITAMIN B12: Vitamin B-12: 210 pg/mL (ref 180–914)

## 2019-07-05 LAB — HIV ANTIBODY (ROUTINE TESTING W REFLEX): HIV Screen 4th Generation wRfx: NONREACTIVE

## 2019-07-05 LAB — PHOSPHORUS: Phosphorus: 3.6 mg/dL (ref 2.5–4.6)

## 2019-07-05 LAB — ECHOCARDIOGRAM COMPLETE

## 2019-07-05 LAB — SARS CORONAVIRUS 2 (TAT 6-24 HRS): SARS Coronavirus 2: NEGATIVE

## 2019-07-05 MED ORDER — MEDROXYPROGESTERONE ACETATE 10 MG PO TABS
20.0000 mg | ORAL_TABLET | Freq: Every day | ORAL | Status: DC
  Administered 2019-07-05 – 2019-07-08 (×4): 20 mg via ORAL
  Filled 2019-07-05 (×7): qty 2

## 2019-07-05 MED ORDER — POTASSIUM CHLORIDE CRYS ER 20 MEQ PO TBCR
40.0000 meq | EXTENDED_RELEASE_TABLET | Freq: Once | ORAL | Status: AC
  Administered 2019-07-05: 40 meq via ORAL
  Filled 2019-07-05: qty 2

## 2019-07-05 MED ORDER — LORAZEPAM 2 MG/ML IJ SOLN
1.0000 mg | INTRAMUSCULAR | Status: DC | PRN
  Administered 2019-07-05 (×6): 2 mg via INTRAVENOUS
  Administered 2019-07-06 (×3): 4 mg via INTRAVENOUS
  Administered 2019-07-07: 2 mg via INTRAVENOUS
  Filled 2019-07-05 (×2): qty 1
  Filled 2019-07-05: qty 2
  Filled 2019-07-05 (×4): qty 1
  Filled 2019-07-05: qty 2
  Filled 2019-07-05: qty 1
  Filled 2019-07-05: qty 2
  Filled 2019-07-05 (×5): qty 1

## 2019-07-05 MED ORDER — VITAMIN B-1 100 MG PO TABS: 100.0000 mg | ORAL_TABLET | Freq: Every day | ORAL | Status: DC

## 2019-07-05 MED ORDER — THIAMINE HCL 100 MG/ML IJ SOLN
100.0000 mg | Freq: Every day | INTRAMUSCULAR | Status: DC
  Administered 2019-07-05: 10:00:00 100 mg via INTRAVENOUS
  Filled 2019-07-05: qty 2

## 2019-07-05 MED ORDER — LORAZEPAM 1 MG PO TABS
1.0000 mg | ORAL_TABLET | ORAL | Status: DC | PRN
  Administered 2019-07-05: 1 mg via ORAL
  Administered 2019-07-06 (×2): 2 mg via ORAL
  Administered 2019-07-06: 1 mg via ORAL
  Administered 2019-07-06: 10:00:00 3 mg via ORAL
  Administered 2019-07-06 – 2019-07-07 (×2): 2 mg via ORAL
  Filled 2019-07-05: qty 1
  Filled 2019-07-05: qty 3
  Filled 2019-07-05 (×2): qty 2

## 2019-07-05 MED ORDER — FOLIC ACID 1 MG PO TABS
1.0000 mg | ORAL_TABLET | Freq: Every day | ORAL | Status: DC
  Administered 2019-07-05 – 2019-07-09 (×5): 1 mg via ORAL
  Filled 2019-07-05 (×5): qty 1

## 2019-07-05 MED ORDER — ENOXAPARIN SODIUM 40 MG/0.4ML ~~LOC~~ SOLN
40.0000 mg | SUBCUTANEOUS | Status: DC
  Administered 2019-07-05 – 2019-07-09 (×5): 40 mg via SUBCUTANEOUS
  Filled 2019-07-05 (×5): qty 0.4

## 2019-07-05 MED ORDER — TRAMADOL HCL 50 MG PO TABS: 50.0000 mg | ORAL_TABLET | Freq: Four times a day (QID) | ORAL | Status: DC | PRN

## 2019-07-05 MED ORDER — RISPERIDONE 1 MG PO TABS
1.0000 mg | ORAL_TABLET | Freq: Every day | ORAL | Status: DC
  Administered 2019-07-05 – 2019-07-08 (×4): 1 mg via ORAL
  Filled 2019-07-05 (×7): qty 1
  Filled 2019-07-05: qty 2

## 2019-07-05 MED ORDER — SODIUM CHLORIDE 0.9% FLUSH
3.0000 mL | Freq: Two times a day (BID) | INTRAVENOUS | Status: DC
  Administered 2019-07-05 – 2019-07-09 (×8): 3 mL via INTRAVENOUS

## 2019-07-05 MED ORDER — ACETAMINOPHEN 650 MG RE SUPP: 650.0000 mg | Freq: Four times a day (QID) | RECTAL | Status: DC | PRN

## 2019-07-05 MED ORDER — ATORVASTATIN CALCIUM 40 MG PO TABS
40.0000 mg | ORAL_TABLET | Freq: Every day | ORAL | Status: DC
  Administered 2019-07-05 – 2019-07-06 (×2): 40 mg via ORAL
  Filled 2019-07-05 (×2): qty 1

## 2019-07-05 MED ORDER — ACETAMINOPHEN 325 MG PO TABS
650.0000 mg | ORAL_TABLET | Freq: Four times a day (QID) | ORAL | Status: DC | PRN
  Administered 2019-07-05 – 2019-07-08 (×2): 650 mg via ORAL
  Filled 2019-07-05 (×3): qty 2

## 2019-07-05 MED ORDER — VITAMIN B-12 1000 MCG PO TABS
1000.0000 ug | ORAL_TABLET | Freq: Every day | ORAL | Status: DC
  Administered 2019-07-06 (×2): 1000 ug via ORAL
  Filled 2019-07-05: qty 1

## 2019-07-05 MED ORDER — SODIUM CHLORIDE 0.9 % IV SOLN
INTRAVENOUS | Status: AC
  Administered 2019-07-05 – 2019-07-06 (×2): via INTRAVENOUS

## 2019-07-05 MED ORDER — MAGNESIUM SULFATE 2 GM/50ML IV SOLN
2.0000 g | Freq: Once | INTRAVENOUS | Status: AC
  Administered 2019-07-05: 2 g via INTRAVENOUS
  Filled 2019-07-05: qty 50

## 2019-07-05 MED ORDER — NICOTINE 21 MG/24HR TD PT24
21.0000 mg | MEDICATED_PATCH | Freq: Every day | TRANSDERMAL | Status: DC
  Administered 2019-07-06 – 2019-07-07 (×2): 21 mg via TRANSDERMAL
  Filled 2019-07-05 (×4): qty 1

## 2019-07-05 MED ORDER — PANTOPRAZOLE SODIUM 40 MG PO TBEC
40.0000 mg | DELAYED_RELEASE_TABLET | Freq: Every day | ORAL | Status: DC
  Administered 2019-07-05 – 2019-07-06 (×2): 40 mg via ORAL
  Filled 2019-07-05 (×2): qty 1

## 2019-07-05 MED ORDER — ADULT MULTIVITAMIN W/MINERALS CH
1.0000 | ORAL_TABLET | Freq: Every day | ORAL | Status: DC
  Administered 2019-07-05 – 2019-07-09 (×5): 1 via ORAL
  Filled 2019-07-05 (×5): qty 1

## 2019-07-05 MED ORDER — SODIUM CHLORIDE 0.45 % IV SOLN
INTRAVENOUS | Status: DC
  Administered 2019-07-05: 05:00:00 via INTRAVENOUS

## 2019-07-05 NOTE — ED Notes (Signed)
Pt's CBG result was 106. Informed Katie - RN.
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
## 2019-07-05 NOTE — ED Notes (Signed)
Pt found walking from room with gown falling off. Pt placed back in room, redressed in gown. Linens changed. Family now at bedside.

## 2019-07-05 NOTE — Procedures (Signed)
Patient Name: Gail Summar  MRN: [id number]  Epilepsy Attending: Lora Havens  Referring Physician/Provider: Dr Adella Hare Date: 07/05/2019 Duration: 25.25 mins  Patient history: 58 year old female with episodes of syncope.  EEG to evaluate for seizures.  Level of alertness: awake, drowsy  AEDs during EEG study: ativan  Technical aspects: This EEG study was done with scalp electrodes positioned according to the 10-20 International system of electrode placement. Electrical activity was acquired at a sampling rate of 500Hz  and reviewed with a high frequency filter of 70Hz  and a low frequency filter of 1Hz . EEG data were recorded continuously and digitally stored.   Description: The posterior dominant rhythm consists of 8-9 Hz activity of moderate voltage (25-35 uV) seen predominantly in posterior head regions, symmetric and reactive to eye opening and eye closing.  There is an excessive amount of 15 to 18 Hz, 2-3 uV beta activity with irregular morphology distributed symmetrically and diffusely.   Hyperventilation and photic stimulation were not performed.  IMPRESSION: This study is within normal limits. No seizures or epileptiform discharges were seen throughout the recording.  The excessive beta activity seen in the background is most likely due to the effect of benzodiazepine and is a benign EEG pattern.  Genny Caulder Barbra Sarks

## 2019-07-05 NOTE — Progress Notes (Signed)
  Echocardiogram 2D Echocardiogram has been performed.  Janet Ramos 07/05/2019, 2:53 PM

## 2019-07-05 NOTE — Progress Notes (Signed)
   Follow Up Note  Please refer to full H&P done earlier this morning Briefly, 58 year old female with history of significant alcohol/tobacco abuse, hyperlipidemia, possible history of cirrhosis, bipolar presents with recurrent/frequent episodes of syncope with questionable seizure activity.  In the ED, neurology was consulted.  Patient admitted for further management.   Today, patient appeared lethargic, with intermittent periods of agitation, likely withdrawing from alcohol with significant tremor.  Patient remained tachycardic.  Patient denies any chest pain, abdominal pain, nausea/vomiting, fever/chills.  Exam:  General: NAD, blind in the left eye, tremors noted  Cardiovascular: S1, S2 present  Respiratory: CTAB  Abdomen: Soft, nontender, nondistended, bowel sounds present  Musculoskeletal: No bilateral pedal edema noted  Skin: Normal  Psychiatry: Normal mood, intermittently agitated   Present on Admission: . Syncope . Polysubstance abuse ([place]) . Hyperlipidemia  Assessment/plan Syncope ? Alcohol related versus seizures, r/o cardiac causes TSH WNL UDS positive for marijuana CT head/C-spine unremarkable MRI showed motion degraded examination without evidence of acute intracranial abnormality Echo showed EF of 60 to 65% EEG studies within normal limits Carotid Doppler pending Neurology consulted Orthostatic vitals PT  Alcohol abuse/withdrawal Advised to quit CIWA protocol Social worker consult  ??  Liver cirrhosis Ultrasound abdomen showed coarse echogenic liver consistent with steatosis and/or hepatocellular disease  Hypokalemia/hypomagnesemia Replace PRN  Vitamin B12 deficiency Likely 2/2 alcohol use Start cyanocobalamin  Chronic thrombocytopenia Likely 2/2 alcohol use Daily CBC

## 2019-07-05 NOTE — ED Notes (Signed)
Patient transported to MRI 

## 2019-07-05 NOTE — Progress Notes (Signed)
EEG complete - results pending 

## 2019-07-05 NOTE — ED Notes (Signed)
Breakfast Ordered 

## 2019-07-05 NOTE — H&P (Signed)
History and Physical    Janet Ramos [id number] DOB: 1960-11-08 DOA: 07/04/2019  PCP: Kerin Perna, NP (Confirm with patient/family/NH records and if not entered, this has to be entered at [facility] point of entry) Patient coming from: Patient is coming from home  I have personally briefly reviewed patient's old medical records in [place]  Chief Complaint: Syncopal episode and possible seizure  HPI: Janet Ramos is a 58 y.o. female with medical history significant of alcohol and nicotine abuse, elevated cholesterol.  Patient reports that she has a 23-month history of having frequent episodes of syncope.  Reports that these come on her rather quickly without any significant symptoms in advance.  She reports that the episodes are brief in nature and she makes an immediate and quick recovery.  She has told her primary care provider about the syncopal episodes.  On the day of admission the patient had had a syncopal episode at home witnessed by family members and there was a question whether she had any seizure activity.  She did have urinary incontinence.  EMS was called and the patient was brought to [facility] emergency department for evaluation.  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   ED Course: Patient was hemodynamically stable in the emergency department.  Lab work was remarkable for a low CO2 at 17 sodium was 133 glucose was normal BUN and creatinine were normal calcium was 8.8, AST was 112 ALT was 70 ammonia level was 30.  Patient was awake and alert in the emergency department.  Hospital neurologist was called for consultation.  He recommended admission for syncope evaluation and had recommended an MRI brain.  [facility] were called for admission  Review of Systems: As per HPI otherwise 10 point review of systems negative except for: Daily a.m. emesis,  head pain after a recent syncopal episode where she struck her head on a marble counter.  She specifically denies any cardiovascular complaints or other GI complaints  Past Medical History:  Diagnosis Date  . 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  STates this is when she started drinking heaviily  . Bipolar 1 disorder ([place])   . Seizures ([place])   . Trichimoniasis 2010    Past Surgical History:  Procedure Laterality Date  . CERVICAL BIOPSY    . CESAREAN SECTION  1990  . ENUCLEATION Left 09/2014   Damage from remote that hit her eye  . EYE SURGERY Right 06/2015   cyst removal from medial canthus  . RUPTURED GLOBE EXPLORATION AND REPAIR Left 09/16/2014   Procedure:  Ruptured Globe Repair Left Eye;  Surgeon: Lamonte Sakai, MD;  Location: [place];  Service: Ophthalmology;  Laterality: Left;  . TUBAL LIGATION    . tubaligation  1994   Social history -patient finished the 10th grade.  She has 6 children, 3 boys and 3 girls.  There were multiple fathers for her children.  Patient has been in a monogomous relationship for 13 years.  She did work as a Training and development officer.  She is currently applying for disability.  Patient lives with her significant other.  Her mother lives in town.  Father lives in [place].  All of her children are grown and live in [place].  Patient smokes a half a pack of cigarettes daily.  Reports she has 2-3 drinks daily.  She reports that she does get shaky when she stops drinking.  She denies ever having alcohol withdrawal related hallucinations or any episodes of DTs   reports that she has been smoking. She started smoking about 43 years ago. She has been smoking about 0.50 packs per day. She has never used smokeless tobacco. She reports current alcohol use of about 8.0 standard drinks of alcohol per week. She reports current drug use. Drug: Marijuana.  No Known Allergies  Family History  Problem Relation Age of Onset  . Lupus Mother    . Heart disease Mother   . Colon cancer Father 89  . Thyroid disease Daughter   . Bipolar disorder Daughter   . Hypertension Other   . Cancer Other   . CAD Other      Prior to Admission medications   Medication Sig Start Date End Date Taking? Authorizing Provider  atorvastatin (LIPITOR) 40 MG tablet Take 1 tablet (40 mg total) by mouth daily. Patient taking differently: Take 40 mg by mouth at bedtime.  06/30/19  Yes Kerin Perna, NP  medroxyPROGESTERone (PROVERA) 10 MG tablet Take 20 mg by mouth at bedtime.   Yes [provider]  Multiple Vitamin (MULTIVITAMIN WITH MINERALS) TABS tablet Take 1 tablet by mouth every morning. Centrum over 50   Yes [provider]  naproxen sodium (ALEVE) 220 MG tablet Take 220 mg by mouth daily as needed (pain).   Yes [provider]  risperiDONE (RISPERDAL) 1 MG tablet Take 1 tablet (1 mg total) by mouth at bedtime. 06/30/19  Yes Kerin Perna, NP  dextromethorphan-guaiFENesin (MUCINEX DM) 30-600 MG 12hr tablet Take 2 tablets by mouth 2 (two) times daily. 06/30/19   Kerin Perna, NP  loratadine (CLARITIN) 10 MG tablet Take 1 tablet (10 mg total) by mouth daily. Patient not taking: Reported on 06/30/2019 05/12/19   Kerin Perna, NP  omeprazole (PRILOSEC) 20 MG capsule Take 1 capsule (20 mg total) by mouth daily. Patient not taking: Reported on 06/30/2019 05/12/19   Kerin Perna, NP  polyethylene glycol powder (GLYCOLAX/MIRALAX) 17 GM/SCOOP powder Take 17 g by mouth 2 (two) times daily as needed. Patient not taking: Reported on 07/04/2019 04/14/19   Kerin Perna, NP    Physical Exam: Vitals:   07/04/19 2300 07/04/19 2330 07/04/19 2345 07/05/19 0000  BP: (!) 149/98 (!) 136/93 134/86 (!) 145/81  Pulse: 91 84 74 71  Resp: (!) 23 (!) 27 (!) 28 (!) 29  Temp:      TempSrc:      SpO2: 97% 96% 96% 97%    Constitutional: NAD, calm, comfortable Vitals:   07/04/19 2300 07/04/19 2330 07/04/19 2345  07/05/19 0000  BP: (!) 149/98 (!) 136/93 134/86 (!) 145/81  Pulse: 91 84 74 71  Resp: (!) 23 (!) 27 (!) 28 (!) 29  Temp:      TempSrc:      SpO2: 97% 96% 96% 97%   General appearance: Heavyset overweight woman who is in no acute distress.  She is anxious.   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  Right eye with a normal pupil that is reactive.  Conjunctiva and sclera are clear,  ENMT: Mucous membranes are moist. Posterior pharynx clear of any exudate or lesions.Normal dentition.  Neck: normal, supple, no masses, no thyromegaly Respiratory: clear to auscultation bilaterally, no wheezing, no crackles. Normal respiratory effort. No accessory muscle use.  Cardiovascular: Regular rate and rhythm, no murmurs / rubs / gallops. No  extremity edema. 2+ pedal pulses. No carotid bruits.  Abdomen: Obese, no tenderness, no masses palpated. No hepatosplenomegaly. Bowel sounds positive.  Musculoskeletal: no clubbing / cyanosis. No joint deformity upper and lower extremities. Good ROM, no contractures. Normal muscle tone.  Skin:  Rash dark maculopapular rash on the left forearm, no other lesions, no ulcers. No induration Neurologic: CN 2-12 grossly intact except for loss of vision in the left eye. Sensation intact,  Strength 5/5 in all 4.  Tremulous but no other movement changes  psychiatric: Normal judgment and insight. Alert and oriented x 3.  Flat affect.     Labs on Admission: I have personally reviewed following labs and imaging studies  CBC: Recent Labs  Lab 07/04/19 2021 07/05/19 0040  WBC 4.8 4.2  NEUTROABS 2.8  --   HGB 14.8 13.3  HCT 39.6 36.1  MCV 93.8 93.8  PLT 100* 83*   Basic Metabolic Panel: Recent Labs  Lab 07/04/19 2021  NA 133*  K 4.6  CL 100  CO2 17*  GLUCOSE 96  BUN <5*  CREATININE 0.67  CALCIUM 8.8*   GFR: CrCl cannot be calculated (Unknown ideal weight.). Liver Function Tests: Recent Labs  Lab 07/04/19 2021  AST 112*  ALT 70*  ALKPHOS 58  BILITOT 0.9  PROT 7.9   ALBUMIN 3.6   No results for input(s): LIPASE, AMYLASE in the last 168 hours. Recent Labs  Lab 07/04/19 2021  AMMONIA 30   Coagulation Profile: Recent Labs  Lab 07/04/19 2021  INR 1.1   Cardiac Enzymes: No results for input(s): CKTOTAL, CKMB, CKMBINDEX, TROPONINI in the last 168 hours. BNP (last 3 results) No results for input(s): PROBNP in the last 8760 hours. HbA1C: No results for input(s): HGBA1C in the last 72 hours. CBG: No results for input(s): GLUCAP in the last 168 hours. Lipid Profile: No results for input(s): CHOL, HDL, LDLCALC, TRIG, CHOLHDL, LDLDIRECT in the last 72 hours. Thyroid Function Tests: Recent Labs    07/04/19 2107  TSH 1.681   Anemia Panel: Recent Labs    07/04/19 2107  VITAMINB12 212   Urine analysis:    Component Value Date/Time   COLORURINE YELLOW 07/04/2019 2021   APPEARANCEUR CLEAR 07/04/2019 2021   LABSPEC 1.005 07/04/2019 2021   PHURINE 5.0 07/04/2019 2021   GLUCOSEU NEGATIVE 07/04/2019 2021   HGBUR NEGATIVE 07/04/2019 2021   BILIRUBINUR NEGATIVE 07/04/2019 2021   BILIRUBINUR negative 01/02/2017 1513   [place] 07/04/2019 2021   PROTEINUR NEGATIVE 07/04/2019 2021   UROBILINOGEN 0.2 01/02/2017 1513   UROBILINOGEN 0.2 09/16/2014 1016   NITRITE NEGATIVE 07/04/2019 2021   LEUKOCYTESUR NEGATIVE 07/04/2019 2021    Radiological Exams on Admission: Ct Head Wo Contrast  Result Date: 07/04/2019 CLINICAL DATA:  LOC, diaphoresis with fall EXAM: CT HEAD WITHOUT CONTRAST CT CERVICAL SPINE WITHOUT CONTRAST TECHNIQUE: Multidetector CT imaging of the head and cervical spine was performed following the standard protocol without intravenous contrast. Multiplanar CT image reconstructions of the cervical spine were also generated. COMPARISON:  CT brain 07/28/2016, 07/19/2017, 10/31/2017 FINDINGS: CT HEAD FINDINGS Brain: No acute territorial infarction, hemorrhage, or intracranial mass. The ventricles are nonenlarged. Mild sulcal prominence  consistent with atrophy. Vascular: No hyperdense vessels. Scattered carotid vascular calcification Skull: Normal. Negative for fracture or focal lesion. Sinuses/Orbits: Old fracture deformity medial wall left orbit. Chronic left globe prosthetic Other: None CT CERVICAL SPINE FINDINGS Alignment: Straightening of the cervical spine. No subluxation. Facet alignment within normal limits. Skull base and vertebrae: No acute fracture.  No primary bone lesion or focal pathologic process. Soft tissues and spinal canal: No prevertebral fluid or swelling. No visible canal hematoma. Disc levels: Mild diffuse degenerative changes C3 through C7 with anterior osteophytes and multiple level minimal disc space narrowing. Upper chest: Negative. Other: Negative IMPRESSION: 1. Negative non contrasted CT appearance of the brain. 2. Straightening of the cervical spine. No acute osseous abnormality Electronically Signed   By: Donavan Foil M.D.   On: 07/04/2019 21:05   Ct Cervical Spine Wo Contrast  Result Date: 07/04/2019 CLINICAL DATA:  LOC, diaphoresis with fall EXAM: CT HEAD WITHOUT CONTRAST CT CERVICAL SPINE WITHOUT CONTRAST TECHNIQUE: Multidetector CT imaging of the head and cervical spine was performed following the standard protocol without intravenous contrast. Multiplanar CT image reconstructions of the cervical spine were also generated. COMPARISON:  CT brain 07/28/2016, 07/19/2017, 10/31/2017 FINDINGS: CT HEAD FINDINGS Brain: No acute territorial infarction, hemorrhage, or intracranial mass. The ventricles are nonenlarged. Mild sulcal prominence consistent with atrophy. Vascular: No hyperdense vessels. Scattered carotid vascular calcification Skull: Normal. Negative for fracture or focal lesion. Sinuses/Orbits: Old fracture deformity medial wall left orbit. Chronic left globe prosthetic Other: None CT CERVICAL SPINE FINDINGS Alignment: Straightening of the cervical spine. No subluxation. Facet alignment within normal  limits. Skull base and vertebrae: No acute fracture. No primary bone lesion or focal pathologic process. Soft tissues and spinal canal: No prevertebral fluid or swelling. No visible canal hematoma. Disc levels: Mild diffuse degenerative changes C3 through C7 with anterior osteophytes and multiple level minimal disc space narrowing. Upper chest: Negative. Other: Negative IMPRESSION: 1. Negative non contrasted CT appearance of the brain. 2. Straightening of the cervical spine. No acute osseous abnormality Electronically Signed   By: Donavan Foil M.D.   On: 07/04/2019 21:05    EKG: Independently reviewed.  Normal sinus rhythm no acute changes  Assessment/Plan Active Problems:   Syncope   Polysubstance abuse (HCC)   Hyperlipidemia  (please populate well all problems here in Problem List. (For example, if patient is on BP meds at home and you resume or decide to hold them, it is a problem that needs to be her. Same for CAD, COPD, HLD and so on)   1.  Syncope -patient with a 59-month history of multiple syncopal events.  She has no prodromal symptoms.  Her episodes are brief in nature with full recovery.  She denies any prior history of seizures.  Patient was evaluated in 2016 with a 2D echo which was normal with a left ventricular ejection fraction of 65 to 70%.  Carotid Dopplers were performed in July 2016 that were likewise normal. Now with possible witnessed seizure. CO2 low on initial lab done soon after episode  Plan neurology consult in progress  Telemetry admission on observation status  Orthostatic vital signs every shift  2D echocardiogram  MRI brain  EEG  2.  Polysubstance abuse -patient uses alcohol daily.  He has been told in the past that this is a problem.  She has attended [employer] but this is not for her he says.  He has mild elevation in transaminases and has been told by her primary care provider that she may have early cirrhosis patient admits that she gets shaky when she stops  drinking.  Patient is a daily smoker. Plan CIWA protocol  Nicotine patch  Abdominal U/S  Patient is encouraged to reconsider AA  3.  Hyperlipidemia -continue home medication  DVT prophylaxis: lovenox (Lovenox/Heparin/SCD's/anticoagulated/None (if comfort care) Code Status: full (Full/Partial (specify  details) Family Communication: [name] - 7 year SO  Tele [phone] Spoke with him and explained adm plan. Please keep him posted. (Specify name, relationship. Do not write "discussed with patient". Specify tel # if discussed over the phone) Disposition Plan: telemetry observation (specify when and where you expect patient to be discharged) Consults called: Hospital Neurology - Dr. Doy Mince (with names) Admission status: telemetry obs (inpatient / obs / tele / medical floor / SDU)   Adella Hare MD [facility] Pager [phone]  If 7PM-7AM, please contact night-coverage [email or web address] Password [email or web address]  07/05/2019, 1:17 AM

## 2019-07-05 NOTE — ED Notes (Signed)
Lunch Tray Ordered @ 1040. 

## 2019-07-05 NOTE — ED Notes (Signed)
Attempted report 

## 2019-07-05 NOTE — ED Notes (Signed)
Tele

## 2019-07-06 ENCOUNTER — Inpatient Hospital Stay (HOSPITAL_COMMUNITY): Payer: Self-pay

## 2019-07-06 DIAGNOSIS — F10239 Alcohol dependence with withdrawal, unspecified: Secondary | ICD-10-CM

## 2019-07-06 DIAGNOSIS — F10939 Alcohol use, unspecified with withdrawal, unspecified: Secondary | ICD-10-CM | POA: Diagnosis present

## 2019-07-06 DIAGNOSIS — R55 Syncope and collapse: Secondary | ICD-10-CM

## 2019-07-06 LAB — CBC WITH DIFFERENTIAL/PLATELET
Abs Immature Granulocytes: 0.01 10*3/uL (ref 0.00–0.07)
Basophils Absolute: 0 10*3/uL (ref 0.0–0.1)
Basophils Relative: 1 %
Eosinophils Absolute: 0.1 10*3/uL (ref 0.0–0.5)
Eosinophils Relative: 1 %
HCT: 39.8 % (ref 36.0–46.0)
Hemoglobin: 14.6 g/dL (ref 12.0–15.0)
Immature Granulocytes: 0 %
Lymphocytes Relative: 24 %
Lymphs Abs: 1.5 10*3/uL (ref 0.7–4.0)
MCH: 34.3 pg — ABNORMAL HIGH (ref 26.0–34.0)
MCHC: 36.7 g/dL — ABNORMAL HIGH (ref 30.0–36.0)
MCV: 93.4 fL (ref 80.0–100.0)
Monocytes Absolute: 0.7 10*3/uL (ref 0.1–1.0)
Monocytes Relative: 11 %
Neutro Abs: 3.9 10*3/uL (ref 1.7–7.7)
Neutrophils Relative %: 63 %
Platelets: 89 10*3/uL — ABNORMAL LOW (ref 150–400)
RBC: 4.26 MIL/uL (ref 3.87–5.11)
RDW: 13.6 % (ref 11.5–15.5)
WBC: 6.3 10*3/uL (ref 4.0–10.5)
nRBC: 0 % (ref 0.0–0.2)

## 2019-07-06 LAB — BASIC METABOLIC PANEL
Anion gap: 12 (ref 5–15)
BUN: <5 mg/dL — ABNORMAL LOW (ref 6–20)
CO2: 18 mmol/L — ABNORMAL LOW (ref 22–32)
Calcium: 9.3 mg/dL (ref 8.9–10.3)
Chloride: 107 mmol/L (ref 98–111)
Creatinine, Ser: 0.67 mg/dL (ref 0.44–1.00)
GFR calc Af Amer: >60 mL/min (ref >60)
GFR calc non Af Amer: >60 mL/min (ref >60)
Glucose, Bld: 108 mg/dL — ABNORMAL HIGH (ref 70–99)
Potassium: 3.8 mmol/L (ref 3.5–5.1)
Sodium: 137 mmol/L (ref 135–145)

## 2019-07-06 LAB — GLUCOSE, CAPILLARY: Glucose-Capillary: 132 mg/dL — ABNORMAL HIGH (ref 70–99)

## 2019-07-06 LAB — MRSA PCR SCREENING: MRSA by PCR: NEGATIVE

## 2019-07-06 LAB — MAGNESIUM: Magnesium: 1.9 mg/dL (ref 1.7–2.4)

## 2019-07-06 MED ORDER — FAMOTIDINE IN NACL 20-0.9 MG/50ML-% IV SOLN
20.0000 mg | Freq: Two times a day (BID) | INTRAVENOUS | Status: DC
  Administered 2019-07-06 – 2019-07-07 (×2): 20 mg via INTRAVENOUS
  Filled 2019-07-06 (×4): qty 50

## 2019-07-06 MED ORDER — SODIUM CHLORIDE 0.9 % IV BOLUS
500.0000 mL | Freq: Once | INTRAVENOUS | Status: AC
  Administered 2019-07-06: 500 mL via INTRAVENOUS

## 2019-07-06 MED ORDER — CHLORHEXIDINE GLUCONATE CLOTH 2 % EX PADS
6.0000 | MEDICATED_PAD | Freq: Every day | CUTANEOUS | Status: DC
  Administered 2019-07-06 – 2019-07-09 (×4): 6 via TOPICAL

## 2019-07-06 MED ORDER — METOPROLOL TARTRATE 5 MG/5ML IV SOLN
5.0000 mg | Freq: Once | INTRAVENOUS | Status: AC
  Administered 2019-07-06: 5 mg via INTRAVENOUS
  Filled 2019-07-06: qty 5

## 2019-07-06 MED ORDER — SODIUM CHLORIDE 0.9 % IV SOLN
INTRAVENOUS | Status: AC
  Administered 2019-07-06 – 2019-07-07 (×2): via INTRAVENOUS

## 2019-07-06 MED ORDER — DEXMEDETOMIDINE HCL IN NACL 200 MCG/50ML IV SOLN
0.4000 ug/kg/h | INTRAVENOUS | Status: DC
  Administered 2019-07-06: 0.4 ug/kg/h via INTRAVENOUS
  Administered 2019-07-06: 20:00:00 0.2 ug/kg/h via INTRAVENOUS
  Filled 2019-07-06 (×4): qty 50

## 2019-07-06 MED ORDER — THIAMINE HCL 100 MG/ML IJ SOLN
500.0000 mg | INTRAVENOUS | Status: DC
  Administered 2019-07-06 – 2019-07-09 (×4): 500 mg via INTRAVENOUS
  Filled 2019-07-06 (×4): qty 5

## 2019-07-06 MED ORDER — CYANOCOBALAMIN 1000 MCG/ML IJ SOLN
1000.0000 ug | Freq: Once | INTRAMUSCULAR | Status: AC
  Administered 2019-07-06: 1000 ug via INTRAMUSCULAR
  Filled 2019-07-06: qty 1

## 2019-07-06 NOTE — Progress Notes (Signed)
EEG was within normal limits.   MRI brain was a motion degraded examination without evidence of acute intracranial abnormality.  Per primary team, the patient is most likely withdrawing from alcohol.   A/R: 1. EtOH withdrawal protocol per primary team.  2. EEG not consistent with epilepsy.  3. No acute lesion on MRI.  4. Syncope work up.  5. Neurology will sign off. Please call if there are additional questions.   Electronically signed: Dr. Kerney Elbe

## 2019-07-06 NOTE — Progress Notes (Signed)
Carotid duplex       has been completed. Preliminary results can be found under CV proc through chart review. Merwin Breden, BS, RDMS, RVT   

## 2019-07-06 NOTE — Progress Notes (Signed)
CSW acknowledging consult for substance abuse. However, at this time, patient not oriented and actively withdrawing; patient not appropriate to engage in discussion about substance abuse or counseling resources. Should patient become appropriate to engage in assessment, please consult CSW again.  Laveda Abbe, [place] Clinical Social Worker [phone]

## 2019-07-06 NOTE — Progress Notes (Signed)
Patient with severe alcohol withdrawal and delirium tremens.  She has received 2 mg, 4 mg, 4 mg, Ativan every hour last 3 hours in the floor and CIWA scale remains more than 20.  Delirious, tachycardic and hypertensive. Transfer to intensive care unit.  Discussed with intensivist.  Starting Precedex. Plan: N.p.o. Remains on thiamine IV high-dose B12 was low, will give 1 dose of intramuscular B12 , thousand micrograms Keep n.p.o. Continue maintenance IV fluids.

## 2019-07-06 NOTE — Progress Notes (Signed)
PROGRESS NOTE    Janet Ramos  [id number] DOB: [birth year] DOA: 07/04/2019 PCP: Kerin Perna, NP    Brief Narrative:  Patient is 58 year old female with history of alcohol and nicotine abuse, hyperlipidemia presented to the emergency room with syncopal episode.  Apparently she passed out at home, witnessed by family member, EMS was called and brought to ER.  Reportedly, patient does have frequent episodes of syncope for last few months.  Patient also does drink fairly heavily.  In the emergency room she was hemodynamically stable.  Mildly elevated AST and ALT.  She was awake and alert.  Was admitted for syncope evaluation, now developed full-blown alcohol withdrawal.   Assessment & Plan:   Active Problems:   Syncope   Polysubstance abuse (HCC)   Hyperlipidemia  Syncope: Clear cause unknown.  Related to alcohol intoxication is possible.  Negative for seizures. Telemetry with sinus rhythm. Echo with normal EF.  MRI with no evidence of acute findings.  CT scan of the head and C-spine unremarkable.  TSH normal.  UDS positive for marijuana.  Continue on telemetry to see if we find any abnormalities, however may likely not find any cardiac arrhythmias.  Alcoholism with alcohol withdrawal and delirium tremens: Patient now developing delirium tremens.  She is confused and delusional. All-time fall precautions.  Delirium precautions.  Soft restraints as needed to protect from injury and to protect lines. High-dose benzodiazepine based CIWA scale. Patient is high risk of Wernicke's encephalopathy, will treat with thiamine 500 mg IV daily for 5 days. Unable to counsel at this time. Continue counseling efforts.  Ultrasound abdomen with evidence of cirrhosis.  LFTs mildly elevated.  We will continue close monitoring. Ammonia was normal.  No ascites. Will check electrolytes, liver function test in the morning.  Hyperlipidemia: AST ALT less than 3 times normal.  Continue  statin.  B12 deficiency: Started on B12 replacement.  Chronic thrombocytopenia: Consistent with chronic liver disease.  Low but is stable.   DVT prophylaxis: Lovenox. Code Status: Full code. Family Communication: I discussed and updated patient's niece, Ms. Janett Billow on the phone. Disposition Plan: Pending clinical improvement.   Consultants:   Neurology, signed off  Procedures:   EEG, encephalopathy no seizure  Antimicrobials:   None   Subjective: Patient seen and examined.  Overnight remained agitated, climbing out of the bed and hallucinating.  Has received 5 mg of Ativan overnight and is still symptomatic. Agreed with restraint to protect from trauma and to protect the lines. Patient is delirious, he thinks he is at home, occasionally unintelligible speech.  Objective: Vitals:   07/06/19 0257 07/06/19 0357 07/06/19 0536 07/06/19 0926  BP:  (!) 137/94  (!) 154/104  Pulse:  (!) 116  (!) 125  Resp: 20 20    Temp:  98.1 F (36.7 C)  99 F (37.2 C)  TempSrc:  Oral  Oral  SpO2:  100%  96%  Weight:   80.3 kg     Intake/Output Summary (Last 24 hours) at 07/06/2019 1028 Last data filed at 07/06/2019 0452 Gross per 24 hour  Intake 2290 ml  Output --  Net 2290 ml   Filed Weights   07/05/19 2057 07/06/19 0536  Weight: 80.3 kg 80.3 kg    Examination:  General exam: Appears anxious, restless, disoriented. Respiratory system: Clear to auscultation. Respiratory effort normal.  No added sounds Cardiovascular system: S1 & S2 heard, tachycardic, RRR. No JVD, murmurs, rubs, gallops or clicks. No pedal edema. Gastrointestinal system: Abdomen is nondistended,  soft and nontender. No organomegaly or masses felt. Normal bowel sounds heard. Central nervous system: Alert but anxious and restless.  No focal neurological deficits.  Follows simple commands. Extremities: Symmetric 5 x 5 power. Skin: No rashes, lesions or ulcers Psychiatry: Judgement and insight appear compromised.   Mood & affect anxious and flat.    Data Reviewed: I have personally reviewed following labs and imaging studies  CBC: Recent Labs  Lab 07/04/19 2021 07/05/19 0040 07/05/19 0301 07/06/19 0351  WBC 4.8 4.2 3.8* 6.3  NEUTROABS 2.8  --   --  3.9  HGB 14.8 13.3 13.2 14.6  HCT 39.6 36.1 36.1 39.8  MCV 93.8 93.8 94.0 93.4  PLT 100* 83* 78* 89*   Basic Metabolic Panel: Recent Labs  Lab 07/04/19 2021 07/05/19 0040 07/05/19 0301 07/06/19 0351  NA 133*  --  137 137  K 4.6  --  3.3* 3.8  CL 100  --  107 107  CO2 17*  --  19* 18*  GLUCOSE 96  --  98 108*  BUN <5*  --  <5* <5*  CREATININE 0.67 0.60 0.60 0.67  CALCIUM 8.8*  --  9.2 9.3  MG  --   --  1.6* 1.9  PHOS  --   --  3.6  --    GFR: Estimated Creatinine Clearance: 80.4 mL/min (by C-G formula based on SCr of 0.67 mg/dL). Liver Function Tests: Recent Labs  Lab 07/04/19 2021 07/05/19 0301  AST 112* 90*  ALT 70* 62*  ALKPHOS 58 53  BILITOT 0.9 0.8  PROT 7.9 7.4  ALBUMIN 3.6 3.3*   No results for input(s): LIPASE, AMYLASE in the last 168 hours. Recent Labs  Lab 07/04/19 2021  AMMONIA 30   Coagulation Profile: Recent Labs  Lab 07/04/19 2021  INR 1.1   Cardiac Enzymes: No results for input(s): CKTOTAL, CKMB, CKMBINDEX, TROPONINI in the last 168 hours. BNP (last 3 results) No results for input(s): PROBNP in the last 8760 hours. HbA1C: No results for input(s): HGBA1C in the last 72 hours. CBG: Recent Labs  Lab 07/05/19 0834 07/06/19 0639  GLUCAP 106* 132*   Lipid Profile: No results for input(s): CHOL, HDL, LDLCALC, TRIG, CHOLHDL, LDLDIRECT in the last 72 hours. Thyroid Function Tests: Recent Labs    07/04/19 2107  TSH 1.681   Anemia Panel: Recent Labs    07/04/19 2107 07/05/19 0138  VITAMINB12 212 210   Sepsis Labs: No results for input(s): PROCALCITON, LATICACIDVEN in the last 168 hours.  Recent Results (from the past 240 hour(s))  SARS CORONAVIRUS 2 (TAT 6-24 HRS) Nasopharyngeal  Nasopharyngeal Swab     Status: None   Collection Time: 07/05/19  1:15 AM   Specimen: Nasopharyngeal Swab  Result Value Ref Range Status   SARS Coronavirus 2 NEGATIVE NEGATIVE Final    Comment: (NOTE) SARS-CoV-2 target nucleic acids are NOT DETECTED. The SARS-CoV-2 RNA is generally detectable in upper and lower respiratory specimens during the acute phase of infection. Negative results do not preclude SARS-CoV-2 infection, do not rule out co-infections with other pathogens, and should not be used as the sole basis for treatment or other patient management decisions. Negative results must be combined with clinical observations, patient history, and epidemiological information. The expected result is Negative. Fact Sheet for Patients: SugarRoll.be Fact Sheet for Healthcare Providers: https://www.woods-mathews.com/ This test is not yet approved or cleared by the [place] FDA and  has been authorized for detection and/or diagnosis of SARS-CoV-2 by FDA under an  Emergency Use Authorization (EUA). This EUA will remain  in effect (meaning this test can be used) for the duration of the COVID-19 declaration under Section 56 4(b)(1) of the Act, 21 U.S.C. section 360bbb-3(b)(1), unless the authorization is terminated or revoked sooner. Performed at [facility], [place] [address] [place], [place] [address]          Radiology Studies: Ct Head Wo Contrast  Result Date: 07/04/2019 CLINICAL DATA:  LOC, diaphoresis with fall EXAM: CT HEAD WITHOUT CONTRAST CT CERVICAL SPINE WITHOUT CONTRAST TECHNIQUE: Multidetector CT imaging of the head and cervical spine was performed following the standard protocol without intravenous contrast. Multiplanar CT image reconstructions of the cervical spine were also generated. COMPARISON:  CT brain 07/28/2016, 07/19/2017, 10/31/2017 FINDINGS: CT HEAD FINDINGS Brain: No acute territorial infarction, hemorrhage, or  intracranial mass. The ventricles are nonenlarged. Mild sulcal prominence consistent with atrophy. Vascular: No hyperdense vessels. Scattered carotid vascular calcification Skull: Normal. Negative for fracture or focal lesion. Sinuses/Orbits: Old fracture deformity medial wall left orbit. Chronic left globe prosthetic Other: None CT CERVICAL SPINE FINDINGS Alignment: Straightening of the cervical spine. No subluxation. Facet alignment within normal limits. Skull base and vertebrae: No acute fracture. No primary bone lesion or focal pathologic process. Soft tissues and spinal canal: No prevertebral fluid or swelling. No visible canal hematoma. Disc levels: Mild diffuse degenerative changes C3 through C7 with anterior osteophytes and multiple level minimal disc space narrowing. Upper chest: Negative. Other: Negative IMPRESSION: 1. Negative non contrasted CT appearance of the brain. 2. Straightening of the cervical spine. No acute osseous abnormality Electronically Signed   By: Donavan Foil M.D.   On: 07/04/2019 21:05   Ct Cervical Spine Wo Contrast  Result Date: 07/04/2019 CLINICAL DATA:  LOC, diaphoresis with fall EXAM: CT HEAD WITHOUT CONTRAST CT CERVICAL SPINE WITHOUT CONTRAST TECHNIQUE: Multidetector CT imaging of the head and cervical spine was performed following the standard protocol without intravenous contrast. Multiplanar CT image reconstructions of the cervical spine were also generated. COMPARISON:  CT brain 07/28/2016, 07/19/2017, 10/31/2017 FINDINGS: CT HEAD FINDINGS Brain: No acute territorial infarction, hemorrhage, or intracranial mass. The ventricles are nonenlarged. Mild sulcal prominence consistent with atrophy. Vascular: No hyperdense vessels. Scattered carotid vascular calcification Skull: Normal. Negative for fracture or focal lesion. Sinuses/Orbits: Old fracture deformity medial wall left orbit. Chronic left globe prosthetic Other: None CT CERVICAL SPINE FINDINGS Alignment: Straightening  of the cervical spine. No subluxation. Facet alignment within normal limits. Skull base and vertebrae: No acute fracture. No primary bone lesion or focal pathologic process. Soft tissues and spinal canal: No prevertebral fluid or swelling. No visible canal hematoma. Disc levels: Mild diffuse degenerative changes C3 through C7 with anterior osteophytes and multiple level minimal disc space narrowing. Upper chest: Negative. Other: Negative IMPRESSION: 1. Negative non contrasted CT appearance of the brain. 2. Straightening of the cervical spine. No acute osseous abnormality Electronically Signed   By: Donavan Foil M.D.   On: 07/04/2019 21:05   Mr Brain Wo Contrast  Result Date: 07/05/2019 CLINICAL DATA:  Syncope. EXAM: MRI HEAD WITHOUT CONTRAST TECHNIQUE: Multiplanar, multiecho pulse sequences of the brain and surrounding structures were obtained without intravenous contrast. COMPARISON:  Head CT 07/04/2019 and MRI 07/19/2017 FINDINGS: Multiple sequences are moderately motion degraded. Brain: There is no evidence of acute infarct, intracranial hemorrhage, mass, midline shift, or extra-axial fluid collection. There is mild cerebral atrophy. No significant cerebral white matter disease is seen for age. Vascular: Hypoplastic vertebrobasilar circulation. Skull and upper cervical  spine: Unremarkable bone marrow signal. Sinuses/Orbits: Old left orbital fracture with prosthetic left globe. Paranasal sinuses and mastoid air cells are clear. Other: None. IMPRESSION: Motion degraded examination without evidence of acute intracranial abnormality. Electronically Signed   By: Logan Bores M.D.   On: 07/05/2019 08:16   US Abdomen Complete  Result Date: 07/05/2019 CLINICAL DATA:  Cirrhosis EXAM: ABDOMEN ULTRASOUND COMPLETE COMPARISON:  CT 04/21/2018 FINDINGS: Gallbladder: No gallstones or wall thickening visualized. No sonographic Murphy sign noted by sonographer. Common bile duct: Diameter: 1.7 mm Liver: Coarse echogenic  liver, limited evaluation. No gross focal hepatic abnormality. Portal vein is patent on color Doppler imaging with normal direction of blood flow towards the liver. IVC: No abnormality visualized. Pancreas: Visualized portion unremarkable. Spleen: Size and appearance within normal limits. Right Kidney: Length: 10.1 cm. Echogenicity within normal limits. No mass or hydronephrosis visualized. Left Kidney: Length: 9.8 cm. Echogenicity within normal limits. No mass or hydronephrosis visualized. Abdominal aorta: No aneurysm visualized. Other findings: None. IMPRESSION: 1. Coarse echogenic liver consistent with steatosis and or hepatocellular disease. No obvious focal parenchymal abnormality. 2. Otherwise negative abdominal ultrasound Electronically Signed   By: Donavan Foil M.D.   On: 07/05/2019 02:25        Scheduled Meds:  atorvastatin  40 mg Oral QHS   enoxaparin (LOVENOX) injection  40 mg Subcutaneous [id number]   folic acid  1 mg Oral Daily   medroxyPROGESTERone  20 mg Oral QHS   multivitamin with minerals  1 tablet Oral Daily   nicotine  21 mg Transdermal Daily   pantoprazole  40 mg Oral Daily   risperiDONE  1 mg Oral QHS   sodium chloride flush  3 mL Intravenous Q12H   vitamin B-12  1,000 mcg Oral Daily   Continuous Infusions:  thiamine injection       LOS: 1 day    Time spent: 35 minutes    Barb Merino, MD [facility] Pager [phone]  If 7PM-7AM, please contact night-coverage [email or web address] Password [facility] 07/06/2019, 10:28 AM

## 2019-07-06 NOTE — Significant Event (Signed)
Rapid Response Event Note  Overview: Time Called: 1232 Arrival Time: 1234 Event Type: MEWS, Other (Comment)(CIWA)  Called about pt with increased MEWS. Vital signs elevated d/t withdrawal, explained to RN pt needs PRN ativan and I will come assess.   Initial Focused Assessment: On arrival, pt restrained in bed. Oriented to self and able to state she is in [place]. Pt on CIWA protocol with q1h ativan 1-4mg  PRN. HR 128, BP 176/114, RR 31, spO2 98% on RA. Pt slightly diaphoretic, mumbling, with tremors noted in all extremities. Pt received ativan at 1000 and 1100 with some relief of agitation per bedside RN.   Interventions: Dr Sloan Leiter paged prior to my arrival and at bedside.  4mg  IV ativan given Pt placed on progressive monitor   Plan of Care (if not transferred): Plan to give 4mg  ativan now then another 4mg  in an hour if symptoms do not improve per Dr Sloan Leiter. RN instructed to keep an eye on pt respiratory status and BP closely while receiving frequent ativan. RN instructed to call with any changes or concerns. Will follow up on pt status.   Event Summary: Name of Physician Notified: Ghimire at (prior to my arrival)    at    Outcome: Stayed in room and stabalized  Event End Time: [place]
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
## 2019-07-06 NOTE — Progress Notes (Signed)
PT Cancellation Note  Patient Details Name: Janet Ramos MRN: [id number] DOB: December 11, 1960   Cancelled Treatment:    Reason Eval/Treat Not Completed: Medical issues which prohibited therapy - patient not appropriate for PT evaluation at this time due to confusion.     Vernal Rutan 07/06/2019, 11:08 AM

## 2019-07-07 DIAGNOSIS — G9340 Encephalopathy, unspecified: Secondary | ICD-10-CM

## 2019-07-07 DIAGNOSIS — F10231 Alcohol dependence with withdrawal delirium: Principal | ICD-10-CM

## 2019-07-07 DIAGNOSIS — F1910 Other psychoactive substance abuse, uncomplicated: Secondary | ICD-10-CM

## 2019-07-07 LAB — CBC WITH DIFFERENTIAL/PLATELET
Abs Immature Granulocytes: 0.02 10*3/uL (ref 0.00–0.07)
Basophils Absolute: 0 10*3/uL (ref 0.0–0.1)
Basophils Relative: 1 %
Eosinophils Absolute: 0.1 10*3/uL (ref 0.0–0.5)
Eosinophils Relative: 2 %
HCT: 37.8 % (ref 36.0–46.0)
Hemoglobin: 13.6 g/dL (ref 12.0–15.0)
Immature Granulocytes: 0 %
Lymphocytes Relative: 19 %
Lymphs Abs: 1.1 10*3/uL (ref 0.7–4.0)
MCH: 34.7 pg — ABNORMAL HIGH (ref 26.0–34.0)
MCHC: 36 g/dL (ref 30.0–36.0)
MCV: 96.4 fL (ref 80.0–100.0)
Monocytes Absolute: 0.5 10*3/uL (ref 0.1–1.0)
Monocytes Relative: 9 %
Neutro Abs: 4.2 10*3/uL (ref 1.7–7.7)
Neutrophils Relative %: 69 %
Platelets: 65 10*3/uL — ABNORMAL LOW (ref 150–400)
RBC: 3.92 MIL/uL (ref 3.87–5.11)
RDW: 13.7 % (ref 11.5–15.5)
WBC: 6 10*3/uL (ref 4.0–10.5)
nRBC: 0 % (ref 0.0–0.2)

## 2019-07-07 LAB — COMPREHENSIVE METABOLIC PANEL
ALT: 50 U/L — ABNORMAL HIGH (ref 0–44)
AST: 80 U/L — ABNORMAL HIGH (ref 15–41)
Albumin: 2.8 g/dL — ABNORMAL LOW (ref 3.5–5.0)
Alkaline Phosphatase: 48 U/L (ref 38–126)
Anion gap: 10 (ref 5–15)
BUN: 6 mg/dL (ref 6–20)
CO2: 17 mmol/L — ABNORMAL LOW (ref 22–32)
Calcium: 8.9 mg/dL (ref 8.9–10.3)
Chloride: 113 mmol/L — ABNORMAL HIGH (ref 98–111)
Creatinine, Ser: 0.59 mg/dL (ref 0.44–1.00)
GFR calc Af Amer: >60 mL/min (ref >60)
GFR calc non Af Amer: >60 mL/min (ref >60)
Glucose, Bld: 96 mg/dL (ref 70–99)
Potassium: 3.3 mmol/L — ABNORMAL LOW (ref 3.5–5.1)
Sodium: 140 mmol/L (ref 135–145)
Total Bilirubin: 1.5 mg/dL — ABNORMAL HIGH (ref 0.3–1.2)
Total Protein: 6.4 g/dL — ABNORMAL LOW (ref 6.5–8.1)

## 2019-07-07 LAB — GLUCOSE, CAPILLARY: Glucose-Capillary: 90 mg/dL (ref 70–99)

## 2019-07-07 LAB — MAGNESIUM: Magnesium: 1.8 mg/dL (ref 1.7–2.4)

## 2019-07-07 LAB — PHOSPHORUS: Phosphorus: 3.5 mg/dL (ref 2.5–4.6)

## 2019-07-07 MED ORDER — FAMOTIDINE 20 MG PO TABS
20.0000 mg | ORAL_TABLET | Freq: Every day | ORAL | Status: DC
  Administered 2019-07-07 – 2019-07-09 (×3): 20 mg via ORAL
  Filled 2019-07-07 (×3): qty 1

## 2019-07-07 MED ORDER — POTASSIUM CHLORIDE CRYS ER 20 MEQ PO TBCR
40.0000 meq | EXTENDED_RELEASE_TABLET | Freq: Once | ORAL | Status: AC
  Administered 2019-07-07: 20:00:00 40 meq via ORAL
  Filled 2019-07-07: qty 2

## 2019-07-07 NOTE — Evaluation (Signed)
Physical Therapy Evaluation Patient Details Name: Janet Ramos MRN: [id number] DOB: Mar 31, 1961 Today's Date: 07/07/2019   History of Present Illness  59 yo aaf with pmh EtOH abuse, bipolar and sz h/o who presented to [facility] with syncopal episode and possible sz. Pt does drink 12 beers/day and some amount of vodka.  Clinical Impression  Pt admitted with above. Pt very lethargic yet responsive and oriented. Pt did receive a dose of ativan earlier this morning. Pt with noted L sided weakness and impaired co-ordination noted during function and MMT. MRI and CT negative.Pt was indep PTA with history of syncopal episodes however now pt requiring modAx2 for all transfers and is unable to ambulate. Pt unable to advance L LE during std pvt transfer requiring maxA to complete transfer. Recommend CIR upon d/c for maximal functional recovery. Acute PT to cont to follow.    Follow Up Recommendations CIR    Equipment Recommendations  (TBD at next venue)    Recommendations for Other Services Rehab consult     Precautions / Restrictions Precautions Precautions: Fall Precaution Comments: lethargic, tremors Restrictions Weight Bearing Restrictions: No      Mobility  Bed Mobility Overal bed mobility: Needs Assistance Bed Mobility: Supine to Sit     Supine to sit: Mod assist     General bed mobility comments: max directional verbal and tactile cues to stay on task and complete transfer, modA for trunk elevation  Transfers Overall transfer level: Needs assistance Equipment used: Rolling walker (2 wheeled);2 person hand held assist Transfers: Sit to/from [facility] Sit to Stand: Mod assist;+2 physical assistance Stand pivot transfers: Mod assist;+2 physical assistance;Max assist       General transfer comment: max directional verbal cues, tactile cues to initiate powering up, pt unable to advance L LE during std pvt or sequencing without maxA to move L LE and provide  tactile cues at hips for weight shifting. pt impulsively sat prematurely on arm rest of chair, self corrected and then sat on L hip, attempted to stand x 3 to shift hips to center of chair however pt unable to process despite max verbal and tactile cues  Ambulation/Gait             General Gait Details: deferred today due to lethargy  Stairs            Wheelchair Mobility    Modified Rankin (Stroke Patients Only)       Balance Overall balance assessment: Needs assistance Sitting-balance support: Feet supported;No upper extremity supported Sitting balance-Leahy Scale: Fair     Standing balance support: Bilateral upper extremity supported Standing balance-Leahy Scale: Zero Standing balance comment: pt dependent on physical assist                             Pertinent Vitals/Pain Pain Assessment: No/denies pain    Home Living Family/patient expects to be discharged to:: Skilled nursing facility                 Additional Comments: pt reports she lives her boyfriend in 1 story home with steps to enter with HR, walk in shower     Prior Function Level of Independence: Independent         Comments: pt doesn't drive or do grocery shopping. Pt states she cleans and watches TV during the day     Hand Dominance   Dominant Hand: Right    Extremity/Trunk Assessment   Upper  Extremity Assessment Upper Extremity Assessment: LUE deficits/detail LUE Deficits / Details: grossly 3-/5, unable to raise UE greater than 60 deg actively but has full shld ROM passively, impaired fine motor dexterity LUE Coordination: decreased fine motor;decreased gross motor    Lower Extremity Assessment Lower Extremity Assessment: LLE deficits/detail LLE Deficits / Details: grossly 3-/5, unable to advance L LE during std pvt to chair LLE Coordination: decreased gross motor    Cervical / Trunk Assessment Cervical / Trunk Assessment: Normal  Communication    Communication: Expressive difficulties(delayed response time)  Cognition Arousal/Alertness: Lethargic Behavior During Therapy: Flat affect(RN reports giving pt ativan earlier this morning for tremors) Overall Cognitive Status: Impaired/Different from baseline Area of Impairment: Orientation;Following commands;Safety/judgement;Problem solving;Awareness                 Orientation Level: Disoriented to;Situation(stated Zac[facility] Pontes but asks what happened)     Following Commands: Follows one step commands with increased time Safety/Judgement: Decreased awareness of safety;Decreased awareness of deficits Awareness: Intellectual Problem Solving: Slow processing;Decreased initiation;Difficulty sequencing;Requires verbal cues;Requires tactile cues General Comments: max directional verbal and tactile cues      General Comments General comments (skin integrity, edema, etc.): VSS    Exercises     Assessment/Plan    PT Assessment Patient needs continued PT services  PT Problem List Decreased strength;Decreased range of motion;Decreased activity tolerance;Decreased balance;Decreased mobility;Decreased coordination;Decreased cognition;Decreased knowledge of use of DME;Decreased safety awareness;Decreased knowledge of precautions       PT Treatment Interventions DME instruction;Gait training;Stair training;Functional mobility training;Therapeutic activities;Therapeutic exercise;Balance training;Neuromuscular re-education;Cognitive remediation    PT Goals (Current goals can be found in the Care Plan section)  Acute Rehab PT Goals Patient Stated Goal: didn't state PT Goal Formulation: Patient unable to participate in goal setting Time For Goal Achievement: 07/21/19 Potential to Achieve Goals: Good    Frequency Min 4X/week   Barriers to discharge Decreased caregiver support boyfriend works during the day, grown kids live in [place]  PT "6 Clicks" Mobility  Outcome Measure Help needed turning from your back to your side while in a flat bed without using bedrails?: A Lot Help needed moving from lying on your back to sitting on the side of a flat bed without using bedrails?: A Lot Help needed moving to and from a bed to a chair (including a wheelchair)?: A Lot Help needed standing up from a chair using your arms (e.g., wheelchair or bedside chair)?: A Lot Help needed to walk in hospital room?: A Lot Help needed climbing 3-5 steps with a railing? : Total 6 Click Score: 11    End of Session Equipment Utilized During Treatment: Gait belt;Oxygen Activity Tolerance: Patient limited by lethargy Patient left: in chair;with call bell/phone within reach;with chair alarm set;with nursing/sitter in room Nurse Communication: Mobility status PT Visit Diagnosis: Unsteadiness on feet (R26.81);Difficulty in walking, not elsewhere classified (R26.2)    Time: [id number] PT Time Calculation (min) (ACUTE ONLY): 25 min   Charges:   PT Evaluation $PT Eval Moderate Complexity: 1 Mod PT Treatments $Therapeutic Activity: 8-22 mins        Kittie Plater, PT, DPT Acute Rehabilitation Services Pager #: [phone] Office #: [phone]   Berline Lopes 07/07/2019, 11:08 AM

## 2019-07-07 NOTE — Progress Notes (Signed)
Rehab Admissions Coordinator Note:  Patient was screened by Michel Santee for appropriateness for an Inpatient Acute Rehab Consult. Note pt does not currently have a rehab diagnosis.  At this time, we are recommending [facility].  Michel Santee 07/07/2019, 11:54 AM  I can be reached at [phone].
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
## 2019-07-07 NOTE — Plan of Care (Signed)

## 2019-07-08 DIAGNOSIS — K7460 Unspecified cirrhosis of liver: Secondary | ICD-10-CM

## 2019-07-08 LAB — CBC
HCT: 36.3 % (ref 36.0–46.0)
Hemoglobin: 13.1 g/dL (ref 12.0–15.0)
MCH: 34.4 pg — ABNORMAL HIGH (ref 26.0–34.0)
MCHC: 36.1 g/dL — ABNORMAL HIGH (ref 30.0–36.0)
MCV: 95.3 fL (ref 80.0–100.0)
Platelets: 69 10*3/uL — ABNORMAL LOW (ref 150–400)
RBC: 3.81 MIL/uL — ABNORMAL LOW (ref 3.87–5.11)
RDW: 13.5 % (ref 11.5–15.5)
WBC: 6 10*3/uL (ref 4.0–10.5)
nRBC: 0 % (ref 0.0–0.2)

## 2019-07-08 LAB — BASIC METABOLIC PANEL
Anion gap: 10 (ref 5–15)
BUN: 7 mg/dL (ref 6–20)
CO2: 18 mmol/L — ABNORMAL LOW (ref 22–32)
Calcium: 8.9 mg/dL (ref 8.9–10.3)
Chloride: 109 mmol/L (ref 98–111)
Creatinine, Ser: 0.71 mg/dL (ref 0.44–1.00)
GFR calc Af Amer: >60 mL/min (ref >60)
GFR calc non Af Amer: >60 mL/min (ref >60)
Glucose, Bld: 85 mg/dL (ref 70–99)
Potassium: 3.5 mmol/L (ref 3.5–5.1)
Sodium: 137 mmol/L (ref 135–145)

## 2019-07-08 LAB — GLUCOSE, CAPILLARY
Glucose-Capillary: 92 mg/dL (ref 70–99)
Glucose-Capillary: 87 mg/dL (ref 70–99)

## 2019-07-08 LAB — CK: Total CK: 656 U/L — ABNORMAL HIGH (ref 38–234)

## 2019-07-08 MED ORDER — POTASSIUM CHLORIDE CRYS ER 20 MEQ PO TBCR
40.0000 meq | EXTENDED_RELEASE_TABLET | Freq: Once | ORAL | Status: AC
  Administered 2019-07-08: 40 meq via ORAL
  Filled 2019-07-08: qty 2

## 2019-07-08 MED ORDER — LORAZEPAM 1 MG PO TABS
1.0000 mg | ORAL_TABLET | ORAL | Status: DC | PRN
  Administered 2019-07-08: 13:00:00 1 mg via ORAL
  Filled 2019-07-08: qty 1

## 2019-07-08 NOTE — Progress Notes (Signed)
PROGRESS NOTE   Janet Ramos  [id number]    DOB: 26-Dec-1960    DOA: 07/04/2019  PCP: Kerin Perna, NP   I have briefly reviewed patients previous medical records in [facility].  Chief Complaint  Patient presents with  . Loss of Consciousness    Brief Narrative:  58 year old married female with PMH of alcohol dependence, bipolar disorder, seizures, presented to [facility] with a syncopal episode and possible seizures.  She reported 3 months history of frequent syncopal episodes some without presyncopal symptoms.  She stated that these were brief in nature when she made "immediate" recovery.  Day of admission 07/05/2019 patient had a witnessed event by family but there was also question of seizure activity with urinary incontinence.  On admission neurology was consulted and recommended syncope work-up.  She then became increasingly agitated and delirious suspected alcohol withdrawal/DTs requiring numerous doses of Ativan.  She was transferred to ICU under CCM care for Precedex drip.  Clinically improved and care transferred back to [facility] on 9/24.  PT evaluated and recommend CIR but does not have a rehab diagnosis and hence possibly SNF will be pursued.   Assessment & Plan:   Active Problems:   Syncope   Polysubstance abuse (HCC)   Hyperlipidemia   Alcohol withdrawal syndrome with complication, with unspecified complication ([place])   Alcohol dependence/withdrawal and polysubstance abuse  As stated above, early on admission became increasingly agitated and delirious requiring numerous doses of Ativan.  Transferred to ICU under CCM care and treated with Precedex drip.  Precedex drip discontinued 9/23.  Clinically improved and no further signs of acute withdrawal. CIWA score 4.  Continue thiamine and folate.  Alcohol abstinence counseled.  Syncope with fall  Carotid u/s 9/22: no significant stenosis  TTE: LVEF 99991111, diastolic dysfunction present, RV normal  CT cspine  and CTH 9/20: 1. Negative non contrasted CT appearance of the brain.2. Straightening of the cervical spine. No acute osseous abnormality  MRI brain 9/21: Motion degraded examination without evidence of acute intracranial abnormality.  EEG within normal limits and as per neurology not consistent with epilepsy.  Neurology signed off 9/22.  Driving restrictions for 6 months.  ?  Need for event monitor upon discharge.  ?Cirrhosis  Abdominal ultrasound 9/21 suggestive of steatosis and/or hepatocellular disease without obvious focal parenchymal abnormality.  Alcohol abstinence counseled.  Hypokalemia  Replaced.  Magnesium normal.  Thrombocytopenia  Likely secondary to alcohol dependence.  Stable and no bleeding.  Follow CBCs periodically.  Generalized weakness and debility  PT recommended CIR but as per CIR, does not have a rehab diagnosis.  Await PT reevaluation today.  May need SNF.  Patient agreeable.  Check CK given diffuse body aches, history of fall to rule out rhabdomyolysis.  Elevated blood pressure  Not on antihypertensives prior to admission.  PRN IV hydralazine.  Bipolar disorder  Currently stable.  Continue Risperdal  Hyperlipidemia   DVT prophylaxis: Lovenox Code Status: Full Family Communication: None at bedside Disposition: To be determined pending clinical improvement and PT evaluation   Consultants:  PCCM, signed off Neurology: Signed off  Procedures:  As above  Antimicrobials:  None   Subjective: Patient interviewed and examined this morning along with RN in room.  Patient reports generalized soreness from falls difficulty raising her upper extremities.  States that she was up in the chair for some time yesterday.  Denies any other complaints.  Objective:  Vitals:   07/08/19 1000 07/08/19 1100 07/08/19 1130 07/08/19 1132  BP: (!) 146/98 (!) 143/114    Pulse: 78 97 (!) 114 (!) 106  Resp: (!) 30 15 (!) 21 (!) 32  Temp:  (!) 97.4 F  (36.3 C)    TempSrc:  Oral    SpO2: 96% 99% 95% 98%  Weight:      Height:        Examination:  General exam: Pleasant young female, moderately built and obese comfortably supine in bed.  Oral mucosa moist. Respiratory system: Clear to auscultation. Respiratory effort normal. Cardiovascular system: S1 & S2 heard, RRR. No JVD, murmurs, rubs, gallops or clicks. No pedal edema.  Telemetry personally reviewed: Sinus rhythm. Gastrointestinal system: Abdomen is nondistended, soft and nontender. No organomegaly or masses felt. Normal bowel sounds heard. Central nervous system: Alert and oriented. No focal neurological deficits. Extremities: Symmetric 5 x 5 power. Skin: No rashes, lesions or ulcers Psychiatry: Judgement and insight appear normal. Mood & affect flat.     Data Reviewed: I have personally reviewed following labs and imaging studies  CBC: Recent Labs  Lab 07/04/19 2021 07/05/19 0040 07/05/19 0301 07/06/19 0351 07/07/19 0340 07/08/19 0720  WBC 4.8 4.2 3.8* 6.3 6.0 6.0  NEUTROABS 2.8  --   --  3.9 4.2  --   HGB 14.8 13.3 13.2 14.6 13.6 13.1  HCT 39.6 36.1 36.1 39.8 37.8 36.3  MCV 93.8 93.8 94.0 93.4 96.4 95.3  PLT 100* 83* 78* 89* 65* 69*   Basic Metabolic Panel: Recent Labs  Lab 07/04/19 2021 07/05/19 0040 07/05/19 0301 07/06/19 0351 07/07/19 0340 07/08/19 0720  NA 133*  --  137 137 140 137  K 4.6  --  3.3* 3.8 3.3* 3.5  CL 100  --  107 107 113* 109  CO2 17*  --  19* 18* 17* 18*  GLUCOSE 96  --  98 108* 96 85  BUN <5*  --  <5* <5* 6 7  CREATININE 0.67 0.60 0.60 0.67 0.59 0.71  CALCIUM 8.8*  --  9.2 9.3 8.9 8.9  MG  --   --  1.6* 1.9 1.8  --   PHOS  --   --  3.6  --  3.5  --    Liver Function Tests: Recent Labs  Lab 07/04/19 2021 07/05/19 0301 07/07/19 0340  AST 112* 90* 80*  ALT 70* 62* 50*  ALKPHOS 58 53 48  BILITOT 0.9 0.8 1.5*  PROT 7.9 7.4 6.4*  ALBUMIN 3.6 3.3* 2.8*    Cardiac Enzymes: No results for input(s): CKTOTAL, CKMB, CKMBINDEX,  TROPONINI in the last 168 hours.  CBG: Recent Labs  Lab 07/05/19 0834 07/06/19 0639 07/07/19 0753 07/07/19 2204 07/08/19 0801  GLUCAP 106* 132* 90 92 87    Recent Results (from the past 240 hour(s))  SARS CORONAVIRUS 2 (TAT 6-24 HRS) Nasopharyngeal Nasopharyngeal Swab     Status: None   Collection Time: 07/05/19  1:15 AM   Specimen: Nasopharyngeal Swab  Result Value Ref Range Status   SARS Coronavirus 2 NEGATIVE NEGATIVE Final    Comment: (NOTE) SARS-CoV-2 target nucleic acids are NOT DETECTED. The SARS-CoV-2 RNA is generally detectable in upper and lower respiratory specimens during the acute phase of infection. Negative results do not preclude SARS-CoV-2 infection, do not rule out co-infections with other pathogens, and should not be used as the sole basis for treatment or other patient management decisions. Negative results must be combined with clinical observations, patient history, and epidemiological information. The expected result is Negative. Fact Sheet for Patients:  SugarRoll.be Fact Sheet for Healthcare Providers: https://www.woods-mathews.com/ This test is not yet approved or cleared by the [place] FDA and  has been authorized for detection and/or diagnosis of SARS-CoV-2 by FDA under an Emergency Use Authorization (EUA). This EUA will remain  in effect (meaning this test can be used) for the duration of the COVID-19 declaration under Section 56 4(b)(1) of the Act, 21 U.S.C. section 360bbb-3(b)(1), unless the authorization is terminated or revoked sooner. Performed at [facility], [place] [address] [place], [place] [address]   MRSA PCR Screening     Status: None   Collection Time: 07/06/19  4:49 PM   Specimen: Nasal Mucosa; Nasopharyngeal  Result Value Ref Range Status   MRSA by PCR NEGATIVE NEGATIVE Final    Comment:        The GeneXpert MRSA Assay (FDA approved for NASAL specimens only), is one  component of a comprehensive MRSA colonization surveillance program. It is not intended to diagnose MRSA infection nor to guide or monitor treatment for MRSA infections. Performed at [facility], [place] [address] [place], [place] [address]          Radiology Studies: No results found.      Scheduled Meds: . Chlorhexidine Gluconate Cloth  6 each Topical Daily  . enoxaparin (LOVENOX) injection  40 mg Subcutaneous Q24H  . famotidine  20 mg Oral Daily  . folic acid  1 mg Oral Daily  . medroxyPROGESTERone  20 mg Oral QHS  . multivitamin with minerals  1 tablet Oral Daily  . risperiDONE  1 mg Oral QHS  . sodium chloride flush  3 mL Intravenous Q12H   Continuous Infusions: . thiamine injection 500 mg (07/08/19 1055)     LOS: 3 days     Vernell Leep, MD, FACP, [facility]. [facility]  To contact the attending provider between 7A-7P or the covering provider during after hours 7P-7A, please log into the web site [email or web address] and access using universal [place] password for that web site. If you do not have the password, please call the hospital operator.  07/08/2019, 11:40 AM

## 2019-07-08 NOTE — Progress Notes (Signed)
Physical Therapy Treatment Patient Details Name: Janet Ramos MRN: [id number] DOB: [birth year] Today's Date: 07/08/2019    History of Present Illness 58 yo aaf with pmh EtOH abuse, bipolar and sz h/o who presented to [facility] with syncopal episode and possible sz. Pt does drink 12 beers/day and some amount of vodka.    PT Comments    Pt admitted with above diagnosis. Pt was able to ambulate with RW but needed min assist with close chair follow as pt has bil knee instability and buckles needing assist.  Pt unaware of deficits and also has difficulty steering RW at times.  Will contiue to follow.   Pt currently with functional limitations due to the deficits listed below (see PT Problem List). Pt will benefit from skilled PT to increase their independence and safety with mobility to allow discharge to the venue listed below.     Follow Up Recommendations  CIR     Equipment Recommendations  (TBD at next venue)    Recommendations for Other Services Rehab consult     Precautions / Restrictions Precautions Precautions: Fall Restrictions Weight Bearing Restrictions: No    Mobility  Bed Mobility Overal bed mobility: Needs Assistance Bed Mobility: Supine to Sit     Supine to sit: Min assist     General bed mobility comments: Pt was able to sit up with min assist only.    Transfers Overall transfer level: Needs assistance Equipment used: Rolling walker (2 wheeled);2 person hand held assist Transfers: Sit to/from [facility] Sit to Stand: Min assist;+2 safety/equipment         General transfer comment: Pt able to stand to RW with min assist.   Ambulation/Gait Ambulation/Gait assistance: Min assist;+2 safety/equipment Gait Distance (Feet): 75 Feet(40 feet then 35 feet) Assistive device: Rolling walker (2 wheeled) Gait Pattern/deviations: Decreased step length - left;Decreased weight shift to left;Decreased dorsiflexion - left;Trunk flexed;Drifts right/left    Gait velocity interpretation: <1.31 ft/sec, indicative of household ambulator General Gait Details: Pt was able to ambulate with RW but was slightly unsteady on her feet.  Pt tends to have knee instability as she fatigues and needed sitting rest break in chair.  Pt with poor awareness of deficits.    Stairs             Wheelchair Mobility    Modified Rankin (Stroke Patients Only)       Balance Overall balance assessment: Needs assistance Sitting-balance support: Feet supported;No upper extremity supported Sitting balance-Leahy Scale: Fair     Standing balance support: Bilateral upper extremity supported Standing balance-Leahy Scale: Poor Standing balance comment: pt dependent on RW and some physical assist                            Cognition Arousal/Alertness: Awake/alert Behavior During Therapy: Flat affect Overall Cognitive Status: Impaired/Different from baseline Area of Impairment: Orientation;Following commands;Safety/judgement;Problem solving;Awareness                 Orientation Level: Disoriented to;Situation(stated [facility]ac[facility] Pontes but asks what happened)     Following Commands: Follows one step commands with increased time Safety/Judgement: Decreased awareness of safety;Decreased awareness of deficits Awareness: Intellectual Problem Solving: Slow processing;Decreased initiation;Difficulty sequencing;Requires verbal cues;Requires tactile cues General Comments: max directional verbal and tactile cues      Exercises      General Comments General comments (skin integrity, edema, etc.): VSS      Pertinent Vitals/Pain Pain Assessment: Faces Faces  Pain Scale: Hurts even more Pain Location: right groin Pain Descriptors / Indicators: Grimacing;Guarding Pain Intervention(s): Limited activity within patient's tolerance;Monitored during session;Repositioned    Home Living                      Prior Function            PT Goals  (current goals can now be found in the care plan section) Acute Rehab PT Goals Patient Stated Goal: didn't state Progress towards PT goals: Progressing toward goals    Frequency    Min 4X/week      PT Plan Current plan remains appropriate    Co-evaluation              AM-PAC PT "6 Clicks" Mobility   Outcome Measure  Help needed turning from your back to your side while in a flat bed without using bedrails?: A Little Help needed moving from lying on your back to sitting on the side of a flat bed without using bedrails?: A Little Help needed moving to and from a bed to a chair (including a wheelchair)?: A Little Help needed standing up from a chair using your arms (e.g., wheelchair or bedside chair)?: A Little Help needed to walk in hospital room?: A Little Help needed climbing 3-5 steps with a railing? : Total 6 Click Score: 16    End of Session Equipment Utilized During Treatment: Gait belt Activity Tolerance: Patient limited by fatigue Patient left: in chair;with call bell/phone within reach;with chair alarm set Nurse Communication: Mobility status PT Visit Diagnosis: Unsteadiness on feet (R26.81);Difficulty in walking, not elsewhere classified (R26.2)     Time: [id number] PT Time Calculation (min) (ACUTE ONLY): 14 min  Charges:  $Gait Training: 8-22 mins                     [place] Pager:  [phone]  Office:  [place] 07/08/2019, 1:31 PM

## 2019-07-09 ENCOUNTER — Other Ambulatory Visit: Payer: Self-pay | Admitting: Physician Assistant

## 2019-07-09 DIAGNOSIS — R55 Syncope and collapse: Secondary | ICD-10-CM

## 2019-07-09 LAB — COMPREHENSIVE METABOLIC PANEL
ALT: 50 U/L — ABNORMAL HIGH (ref 0–44)
AST: 85 U/L — ABNORMAL HIGH (ref 15–41)
Albumin: 2.9 g/dL — ABNORMAL LOW (ref 3.5–5.0)
Alkaline Phosphatase: 51 U/L (ref 38–126)
Anion gap: 10 (ref 5–15)
BUN: 5 mg/dL — ABNORMAL LOW (ref 6–20)
CO2: 19 mmol/L — ABNORMAL LOW (ref 22–32)
Calcium: 9.2 mg/dL (ref 8.9–10.3)
Chloride: 109 mmol/L (ref 98–111)
Creatinine, Ser: 0.62 mg/dL (ref 0.44–1.00)
GFR calc Af Amer: >60 mL/min (ref >60)
GFR calc non Af Amer: >60 mL/min (ref >60)
Glucose, Bld: 87 mg/dL (ref 70–99)
Potassium: 3.1 mmol/L — ABNORMAL LOW (ref 3.5–5.1)
Sodium: 138 mmol/L (ref 135–145)
Total Bilirubin: 1.1 mg/dL (ref 0.3–1.2)
Total Protein: 6.6 g/dL (ref 6.5–8.1)

## 2019-07-09 LAB — CBC
HCT: 35.4 % — ABNORMAL LOW (ref 36.0–46.0)
Hemoglobin: 12.7 g/dL (ref 12.0–15.0)
MCH: 34 pg (ref 26.0–34.0)
MCHC: 35.9 g/dL (ref 30.0–36.0)
MCV: 94.7 fL (ref 80.0–100.0)
Platelets: 74 10*3/uL — ABNORMAL LOW (ref 150–400)
RBC: 3.74 MIL/uL — ABNORMAL LOW (ref 3.87–5.11)
RDW: 13.1 % (ref 11.5–15.5)
WBC: 4.5 10*3/uL (ref 4.0–10.5)
nRBC: 0 % (ref 0.0–0.2)

## 2019-07-09 LAB — MAGNESIUM: Magnesium: 1.6 mg/dL — ABNORMAL LOW (ref 1.7–2.4)

## 2019-07-09 LAB — PHOSPHORUS: Phosphorus: 3.3 mg/dL (ref 2.5–4.6)

## 2019-07-09 MED ORDER — MAGNESIUM SULFATE 2 GM/50ML IV SOLN
2.0000 g | Freq: Once | INTRAVENOUS | Status: AC
  Administered 2019-07-09: 09:00:00 2 g via INTRAVENOUS
  Filled 2019-07-09: qty 50

## 2019-07-09 MED ORDER — FOLIC ACID 1 MG PO TABS: 1.0000 mg | ORAL_TABLET | Freq: Every day | ORAL | 0 refills | Status: DC

## 2019-07-09 MED ORDER — VITAMIN B-1 100 MG PO TABS: 100.0000 mg | ORAL_TABLET | Freq: Every day | ORAL | 0 refills | Status: DC

## 2019-07-09 MED ORDER — POTASSIUM CHLORIDE CRYS ER 20 MEQ PO TBCR
40.0000 meq | EXTENDED_RELEASE_TABLET | ORAL | Status: AC
  Administered 2019-07-09 (×2): 40 meq via ORAL
  Filled 2019-07-09 (×2): qty 2

## 2019-07-09 MED FILL — FOLIC ACID 1 MG TABS: 1 | 30 days supply | Qty: 30 | Fill #0

## 2019-07-09 MED FILL — VITAMIN B-1 100 MG TABS: 100 | 30 days supply | Qty: 30 | Fill #0

## 2019-07-09 NOTE — TOC Initial Note (Signed)
Transition of Care [facility]) - Initial/Assessment Note    Patient Details  Name: Janet Ramos MRN: [id number] Date of Birth: 1961/10/12  Transition of Care [facility]) CM/SW Contact:    Bethena Roys, RN Phone Number: 07/09/2019, 12:38 PM  Clinical Narrative: Pt presented for Syncope. Hx of alcohol abuse. CM did speak with patient regarding alcohol dependency and resources and patient states "I have made my mind up and I'm not drinking anymore." CM did discuss local resources for her and she declined. CM did discuss  [facility] resources and she also declined. Patient states she will not need [place] services 2/2 she feels that she was unsteady due to her alcohol abuse. Patient states her husband is at home and she was independent prior to hospitalization. CM did schedule a hospital follow up with PCP. Patient states she has transportation and is able to use the [facility] for medication assistance via the weekday. If patient transitions home today patient's medications will need to be sent to [place].   Expected Discharge Plan: Home/Self Care Barriers to Discharge: No Barriers Identified   Patient Goals and CMS Choice Patient states their goals for this hospitalization and ongoing recovery are:: "to return home, and I have made my mind up and I'm not drinking anymore"   Choice offered to / list presented to : NA  Expected Discharge Plan and Services Expected Discharge Plan: Home/Self Care In-house Referral: Financial Counselor Discharge Planning Services: CM Consult Post Acute Care Choice: NA Living arrangements for the past 2 months: Single Family Home                 HH Arranged: Refused HH(Patient feels that she was unsteady due to alcohol abuse.)      Prior Living Arrangements/Services Living arrangements for the past 2 months: Single Family Home Lives with:: Spouse Patient language and need for interpreter reviewed:: Yes        Need for Family Participation in  Patient Care: Yes (Comment) Care giver support system in place?: Yes (comment)   Criminal Activity/Legal Involvement Pertinent to Current Situation/Hospitalization: No - Comment as needed  Activities of Daily Living Home Assistive Devices/Equipment: None ADL Screening (condition at time of admission) Patient's cognitive ability adequate to safely complete daily activities?: No Is the patient deaf or have difficulty hearing?: No Does the patient have difficulty seeing, even when wearing glasses/contacts?: Yes Does the patient have difficulty concentrating, remembering, or making decisions?: Yes Patient able to express need for assistance with ADLs?: Yes Does the patient have difficulty dressing or bathing?: No Independently performs ADLs?: Yes (appropriate for developmental age) Does the patient have difficulty walking or climbing stairs?: Yes Weakness of Legs: None Weakness of Arms/Hands: Both  Permission Sought/Granted Permission sought to share information with : Family Supports     Emotional Assessment Appearance:: Appears stated age Attitude/Demeanor/Rapport: Engaged Affect (typically observed): Calm Orientation: : Oriented to Situation, Oriented to  Time, Oriented to Place, Oriented to Self Alcohol / Substance Use: Alcohol Use Psych Involvement: No (comment)  Admission diagnosis:  Syncope and collapse [R55] Cirrhosis ([place]) [K74.60] Syncope [R55] Patient Active Problem List   Diagnosis Date Noted  . Alcohol withdrawal syndrome with complication, with unspecified complication ([place]) [id number]  . Thickened endometrium 02/09/2018  . Substance induced mood disorder ([place]) 10/31/2017  . Left knee pain 08/28/2015  . Headache 05/08/2015  . Hyperlipidemia 05/08/2015  . Hypokalemia 05/08/2015  . Right sided weakness 05/07/2015  . Syncope 05/07/2015  . Polysubstance abuse ([place]) 05/07/2015  .  Rupture of globe 09/16/2014   PCP:  Kerin Perna, NP Pharmacy:   [place], [place] [address] [place] [address] Phone: [phone] Fax: [phone]     Social Determinants of Health (SDOH) Interventions    Readmission Risk Interventions Readmission Risk Prevention Plan 07/09/2019  Transportation Screening Complete  PCP or Specialist Appt within 5-7 Days Complete  Home Care Screening Complete  Medication Review (RN CM) Complete  Some recent data might be hidden

## 2019-07-09 NOTE — Discharge Instructions (Addendum)
Please get your medications reviewed and adjusted by your Primary MD.  Please request your Primary MD to go over all Hospital Tests and Procedure/Radiological results at the follow up, please get all Hospital records sent to your Prim MD by signing hospital release before you go home.  If you had Pneumonia of Lung problems at the Hospital: Please get a 2 view Chest X ray done in 6-8 weeks after hospital discharge or sooner if instructed by your Primary MD.  If you have Congestive Heart Failure: Please call your Cardiologist or Primary MD anytime you have any of the following symptoms:  1) 3 pound weight gain in 24 hours or 5 pounds in 1 week  2) shortness of breath, with or without a dry hacking cough  3) swelling in the hands, feet or stomach  4) if you have to sleep on extra pillows at night in order to breathe  Follow cardiac low salt diet and 1.5 lit/day fluid restriction.  If you have diabetes Accuchecks 4 times/day, Once in AM empty stomach and then before each meal. Log in all results and show them to your primary doctor at your next visit. If any glucose reading is under 80 or above 300 call your primary MD immediately.  If you have Seizure/Convulsions/Epilepsy: Please do not drive, operate heavy machinery, participate in activities at heights or participate in high speed sports until you have seen by Primary MD or a Neurologist and advised to do so again.  If you had Gastrointestinal Bleeding: Please ask your Primary MD to check a complete blood count within one week of discharge or at your next visit. Your endoscopic/colonoscopic biopsies that are pending at the time of discharge, will also need to followed by your Primary MD.  Get Medicines reviewed and adjusted. Please take all your medications with you for your next visit with your Primary MD  Please request your Primary MD to go over all hospital tests and procedure/radiological results at the follow up, please ask your  Primary MD to get all Hospital records sent to his/her office.  If you experience worsening of your admission symptoms, develop shortness of breath, life threatening emergency, suicidal or homicidal thoughts you must seek medical attention immediately by calling 911 or calling your MD immediately  if symptoms less severe.  You must read complete instructions/literature along with all the possible adverse reactions/side effects for all the Medicines you take and that have been prescribed to you. Take any new Medicines after you have completely understood and accpet all the possible adverse reactions/side effects.   Do not drive or operate heavy machinery when taking Pain medications.   Do not take more than prescribed Pain, Sleep and Anxiety Medications  Special Instructions: If you have smoked or chewed Tobacco  in the last 2 yrs please stop smoking, stop any regular Alcohol  and or any Recreational drug use.  Wear Seat belts while driving.  Please note You were cared for by a hospitalist during your hospital stay. If you have any questions about your discharge medications or the care you received while you were in the hospital after you are discharged, you can call the unit and asked to speak with the hospitalist on call if the hospitalist that took care of you is not available. Once you are discharged, your primary care physician will handle any further medical issues. Please note that NO REFILLS for any discharge medications will be authorized once you are discharged, as it is imperative that you   return to your primary care physician (or establish a relationship with a primary care physician if you do not have one) for your aftercare needs so that they can reassess your need for medications and monitor your lab values.  You can reach the hospitalist office at phone [phone] or fax [phone]   If you do not have a primary care physician, you can call [phone] for a physician  referral.    Syncope  Syncope refers to a condition in which a person temporarily loses consciousness. Syncope may also be called fainting or passing out. It is caused by a sudden decrease in blood flow to the brain. Even though most causes of syncope are not dangerous, syncope can be a sign of a serious medical problem. Your health care provider may do tests to find the reason why you are having syncope. Signs that you may be about to faint include:  Feeling dizzy or light-headed.  Feeling nauseous.  Seeing all white or all black in your field of vision.  Having cold, clammy skin. If you faint, get medical help right away. Call your local emergency services (911 in the [place] Do not drive yourself to the hospital. Follow these instructions at home: Pay attention to any changes in your symptoms. Take these actions to stay safe and to help relieve your symptoms: Lifestyle  Do not drive, use machinery, or play sports until your health care provider says it is okay.  Do not drink alcohol.  Do not use any products that contain nicotine or tobacco, such as cigarettes and e-cigarettes. If you need help quitting, ask your health care provider.  Drink enough fluid to keep your urine pale yellow. General instructions  Take over-the-counter and prescription medicines only as told by your health care provider.  If you are taking blood pressure or heart medicine, get up slowly and take several minutes to sit and then stand. This can reduce dizziness or light-headedness.  Have someone stay with you until you feel stable.  If you start to feel like you might faint, lie down right away and raise (elevate) your feet above the level of your heart. Breathe deeply and steadily. Wait until all the symptoms have passed.  Keep all follow-up visits as told by your health care provider. This is important. Get help right away if you:  Have a severe headache.  Faint once or repeatedly.  Have pain  in your chest, abdomen, or back.  Have a very fast or irregular heartbeat (palpitations).  Have pain when you breathe.  Are bleeding from your mouth or rectum, or you have black or tarry stool.  Have a seizure.  Are confused.  Have trouble walking.  Have severe weakness.  Have vision problems. These symptoms may represent a serious problem that is an emergency. Do not wait to see if your symptoms will go away. Get medical help right away. Call your local emergency services (911 in the [place] Do not drive yourself to the hospital. Summary  Syncope refers to a condition in which a person temporarily loses consciousness. It is caused by a sudden decrease in blood flow to the brain.  Signs that you may be about to faint include dizziness, feeling light-headed, feeling nauseous, sudden vision changes, or cold, clammy skin.  Although most causes of syncope are not dangerous, syncope can be a sign of a serious medical problem. If you faint, get medical help right away. This information is not intended to replace advice given to you by  your health care provider. Make sure you discuss any questions you have with your health care provider. Document Released: 09/30/2005 Document Revised: 09/12/2017 Document Reviewed: 09/08/2017 Elsevier Patient Education  2020 [employer].

## 2019-07-09 NOTE — Discharge Summary (Addendum)
Physician Discharge Summary  Janet Ramos [id number] DOB: [birth year]  PCP: Kerin Perna, NP  Admitted from: Home Discharged to: Home.  Patient declined SNF.  Admit date: 07/04/2019 Discharge date: 07/09/2019  Recommendations for Outpatient Follow-up:   Follow-up Information    Kerin Perna, NP Follow up on 07/29/2019.   Specialty: Internal Medicine Why: @ 9:30 am for hospital follow up appointment- please call the office if you cannot make this scheduled appointment.  To be seen with repeat labs (CBC, CMP, CK & Magnesium). Contact information: [address] [address] [phone]        [facility] Follow up.   Why: Please utilize this onsite pharmacy-Monday-Friday for medication assistance- cost ranges from $4.00-$10.00 Contact information: [address] [place] [phone] [place] Office Follow up.   Specialty: Cardiology Why: will call for heart monitor Contact information: [address], [place]: Patient declined home health services and substance abuse resources offered by case management. Equipment/Devices: None  Discharge Condition: Improved and stable CODE STATUS: Full Diet recommendation: Heart healthy diet.  Discharge Diagnoses:  Active Problems:   Syncope   Polysubstance abuse (HCC)   Hyperlipidemia   Alcohol withdrawal syndrome with complication, with unspecified complication [facility])   Brief Summary: 58 year old married female with PMH of alcohol dependence, bipolar disorder, seizures, presented to [facility] with a syncopal episode and possible seizures.  She reported 3 months history of frequent syncopal episodes some without presyncopal symptoms.  She stated that these were brief in nature when she made "immediate" recovery.  Day of admission  07/05/2019 patient had a witnessed event by family but there was also question of seizure activity with urinary incontinence.  On admission Neurology was consulted and recommended syncope work-up.  She then became increasingly agitated and delirious suspected alcohol withdrawal/DTs requiring numerous doses of Ativan.  She was transferred to ICU under CCM care for Precedex drip.  Clinically improved and care transferred back to [facility] on 9/24.  PT evaluated and recommend CIR but does not have a rehab diagnosis and hence possibly SNF will be pursued.  Patient however declined SNF.   Assessment & Plan:  Alcohol dependence/withdrawal and polysubstance abuse  As stated above, early on admission became increasingly agitated and delirious requiring numerous doses of Ativan.  Transferred to ICU under CCM care and treated with Precedex drip.  Precedex drip discontinued 9/23 early morning hours.  Clinically improved and no further signs of acute withdrawal.  Continue thiamine and folate.  Alcohol abstinence counseled.  Case manager attempted to offer local resources for substance abuse which patient declined.  Syncope with fall  Carotid u/s 9/22: no significant stenosis  TTE: LVEF [id number], diastolic dysfunction present, RV normal  CT cspine and CTH 9/20:1. Negative non contrasted CT appearance of the brain.2. Straightening of the cervical spine. No acute osseous abnormality  MRI brain 9/21: Motion degraded examination without evidence of acute intracranial abnormality.  EEG within normal limits and as per Neurology not consistent with epilepsy.  Neurology signed off 9/22.  Patient feels that her passing out was likely related to alcohol abuse.  She indicates that she does not drive and has been counseled that she should not drive for 6 months.  She verbalized understanding.  Discussed with [facility] who will arrange outpatient heart monitor  to rule out arrhythmias although very low index  of suspicion for same given no arrhythmias noted on telemetry thus far and her history and presentation is suspicious for syncope and fall related to alcohol abuse.  ?Cirrhosis  Abdominal ultrasound 9/21 suggestive of steatosis and/or hepatocellular disease without obvious focal parenchymal abnormality.  Alcohol abstinence counseled.  Mildly abnormal transaminases are stable.  Closely follow LFTs as outpatient.  Hypokalemia/hypomagnesemia  Replaced on day of discharge.  Follow as outpatient.  Thrombocytopenia  Likely secondary to alcohol dependence.  Stable and no bleeding.  Follow CBCs periodically as outpatient.  Generalized weakness and debility  PT recommended CIR but as per CIR, does not have a rehab diagnosis.  Patient this morning informed nursing and this MD that she was not interested in going to rehab to any place and wanted to be discharged home.  She was made well aware of risks of declining rehab including but not limited to falls, fractures, serious life-threatening injuries.  She verbalized understanding but insisted on going home despite knowing the risks.  CK mildly elevated at 656.  Elevated blood pressure  Not on antihypertensives prior to admission.  PRN IV hydralazine while hospitalized and monitor off of antihypertensives as outpatient.  Bipolar disorder  Currently stable.  Continue Risperdal  Hyperlipidemia  Continue prior home dose of statins.    Consultants:  PCCM Neurology  Procedures:  Carotid Dopplers 07/06/2019: Summary: Right Carotid: Velocities in the right ICA are consistent with a 1-39% stenosis.  Left Carotid: Velocities in the left ICA are consistent with a 1-39% stenosis.  TTE 07/05/2019:  IMPRESSIONS    1. Left ventricular ejection fraction, by visual estimation, is 60 to 65%. The left ventricle has normal function. Normal left ventricular size. There is no left ventricular hypertrophy.  2. Left ventricular  diastolic Doppler parameters are consistent with impaired relaxation pattern of LV diastolic filling.  3. Global right ventricle has normal systolic function.The right ventricular size is normal. No increase in right ventricular wall thickness.  4. Left atrial size was normal.  5. Right atrial size was normal.  6. The mitral valve is normal in structure. No evidence of mitral valve regurgitation. No evidence of mitral stenosis.  7. The tricuspid valve is normal in structure. Tricuspid valve regurgitation is trivial.  8. The aortic valve is normal in structure. Aortic valve regurgitation was not visualized by color flow Doppler. Structurally normal aortic valve, with no evidence of sclerosis or stenosis.  9. The pulmonic valve was normal in structure. Pulmonic valve regurgitation is trivial by color flow Doppler. 10. The inferior vena cava is normal in size with greater than 50% respiratory variability, suggesting right atrial pressure of 3 mmHg.  Discharge Instructions  Discharge Instructions    Call MD for:   Complete by: As directed    Passing out or feeling like passing out.   Call MD for:  difficulty breathing, headache or visual disturbances   Complete by: As directed    Call MD for:  extreme fatigue   Complete by: As directed    Call MD for:  persistant dizziness or light-headedness   Complete by: As directed    Call MD for:  persistant nausea and vomiting   Complete by: As directed    Call MD for:  severe uncontrolled pain   Complete by: As directed    Call MD for:  temperature >100.4   Complete by: As directed    Diet - low sodium heart healthy   Complete  by: As directed    Driving Restrictions   Complete by: As directed    No driving for 6 months.   Increase activity slowly   Complete by: As directed        Medication List    STOP taking these medications   dextromethorphan-guaiFENesin 30-600 MG 12hr tablet Commonly known as: MUCINEX DM   loratadine 10 MG  tablet Commonly known as: CLARITIN   omeprazole 20 MG capsule Commonly known as: PRILOSEC   polyethylene glycol powder 17 GM/SCOOP powder Commonly known as: GLYCOLAX/MIRALAX     TAKE these medications   atorvastatin 40 MG tablet Commonly known as: LIPITOR Take 1 tablet (40 mg total) by mouth daily. What changed: when to take this   folic acid 1 MG tablet Commonly known as: FOLVITE Take 1 tablet (1 mg total) by mouth daily. Start taking on: July 10, 2019   medroxyPROGESTERone 10 MG tablet Commonly known as: PROVERA Take 20 mg by mouth at bedtime.   multivitamin with minerals Tabs tablet Take 1 tablet by mouth every morning. Centrum over 50   naproxen sodium 220 MG tablet Commonly known as: ALEVE Take 220 mg by mouth daily as needed (pain).   risperiDONE 1 MG tablet Commonly known as: RISPERDAL Take 1 tablet (1 mg total) by mouth at bedtime.   thiamine 100 MG tablet Commonly known as: VITAMIN B-1 Take 1 tablet (100 mg total) by mouth daily.      No Known Allergies    Procedures/Studies: Ct Head Wo Contrast  Result Date: 07/04/2019 CLINICAL DATA:  LOC, diaphoresis with fall EXAM: CT HEAD WITHOUT CONTRAST CT CERVICAL SPINE WITHOUT CONTRAST TECHNIQUE: Multidetector CT imaging of the head and cervical spine was performed following the standard protocol without intravenous contrast. Multiplanar CT image reconstructions of the cervical spine were also generated. COMPARISON:  CT brain 07/28/2016, 07/19/2017, 10/31/2017 FINDINGS: CT HEAD FINDINGS Brain: No acute territorial infarction, hemorrhage, or intracranial mass. The ventricles are nonenlarged. Mild sulcal prominence consistent with atrophy. Vascular: No hyperdense vessels. Scattered carotid vascular calcification Skull: Normal. Negative for fracture or focal lesion. Sinuses/Orbits: Old fracture deformity medial wall left orbit. Chronic left globe prosthetic Other: None CT CERVICAL SPINE FINDINGS Alignment:  Straightening of the cervical spine. No subluxation. Facet alignment within normal limits. Skull base and vertebrae: No acute fracture. No primary bone lesion or focal pathologic process. Soft tissues and spinal canal: No prevertebral fluid or swelling. No visible canal hematoma. Disc levels: Mild diffuse degenerative changes C3 through C7 with anterior osteophytes and multiple level minimal disc space narrowing. Upper chest: Negative. Other: Negative IMPRESSION: 1. Negative non contrasted CT appearance of the brain. 2. Straightening of the cervical spine. No acute osseous abnormality Electronically Signed   By: Donavan Foil M.D.   On: 07/04/2019 21:05   Ct Cervical Spine Wo Contrast  Result Date: 07/04/2019 CLINICAL DATA:  LOC, diaphoresis with fall EXAM: CT HEAD WITHOUT CONTRAST CT CERVICAL SPINE WITHOUT CONTRAST TECHNIQUE: Multidetector CT imaging of the head and cervical spine was performed following the standard protocol without intravenous contrast. Multiplanar CT image reconstructions of the cervical spine were also generated. COMPARISON:  CT brain 07/28/2016, 07/19/2017, 10/31/2017 FINDINGS: CT HEAD FINDINGS Brain: No acute territorial infarction, hemorrhage, or intracranial mass. The ventricles are nonenlarged. Mild sulcal prominence consistent with atrophy. Vascular: No hyperdense vessels. Scattered carotid vascular calcification Skull: Normal. Negative for fracture or focal lesion. Sinuses/Orbits: Old fracture deformity medial wall left orbit. Chronic left globe prosthetic Other: None CT CERVICAL SPINE FINDINGS  Alignment: Straightening of the cervical spine. No subluxation. Facet alignment within normal limits. Skull base and vertebrae: No acute fracture. No primary bone lesion or focal pathologic process. Soft tissues and spinal canal: No prevertebral fluid or swelling. No visible canal hematoma. Disc levels: Mild diffuse degenerative changes C3 through C7 with anterior osteophytes and multiple  level minimal disc space narrowing. Upper chest: Negative. Other: Negative IMPRESSION: 1. Negative non contrasted CT appearance of the brain. 2. Straightening of the cervical spine. No acute osseous abnormality Electronically Signed   By: Donavan Foil M.D.   On: 07/04/2019 21:05   Mr Brain Wo Contrast  Result Date: 07/05/2019 CLINICAL DATA:  Syncope. EXAM: MRI HEAD WITHOUT CONTRAST TECHNIQUE: Multiplanar, multiecho pulse sequences of the brain and surrounding structures were obtained without intravenous contrast. COMPARISON:  Head CT 07/04/2019 and MRI 07/19/2017 FINDINGS: Multiple sequences are moderately motion degraded. Brain: There is no evidence of acute infarct, intracranial hemorrhage, mass, midline shift, or extra-axial fluid collection. There is mild cerebral atrophy. No significant cerebral white matter disease is seen for age. Vascular: Hypoplastic vertebrobasilar circulation. Skull and upper cervical spine: Unremarkable bone marrow signal. Sinuses/Orbits: Old left orbital fracture with prosthetic left globe. Paranasal sinuses and mastoid air cells are clear. Other: None. IMPRESSION: Motion degraded examination without evidence of acute intracranial abnormality. Electronically Signed   By: Logan Bores M.D.   On: 07/05/2019 08:16   US Abdomen Complete  Result Date: 07/05/2019 CLINICAL DATA:  Cirrhosis EXAM: ABDOMEN ULTRASOUND COMPLETE COMPARISON:  CT 04/21/2018 FINDINGS: Gallbladder: No gallstones or wall thickening visualized. No sonographic Murphy sign noted by sonographer. Common bile duct: Diameter: 1.7 mm Liver: Coarse echogenic liver, limited evaluation. No gross focal hepatic abnormality. Portal vein is patent on color Doppler imaging with normal direction of blood flow towards the liver. IVC: No abnormality visualized. Pancreas: Visualized portion unremarkable. Spleen: Size and appearance within normal limits. Right Kidney: Length: 10.1 cm. Echogenicity within normal limits. No mass or  hydronephrosis visualized. Left Kidney: Length: 9.8 cm. Echogenicity within normal limits. No mass or hydronephrosis visualized. Abdominal aorta: No aneurysm visualized. Other findings: None. IMPRESSION: 1. Coarse echogenic liver consistent with steatosis and or hepatocellular disease. No obvious focal parenchymal abnormality. 2. Otherwise negative abdominal ultrasound Electronically Signed   By: Donavan Foil M.D.   On: 07/05/2019 02:25   Vas US Carotid  Result Date: 07/06/2019 Carotid Arterial Duplex Study Indications:       Syncope and Alcohol related vs seizure. Limitations        Today's exam was limited due to the patient's inability or                    unwillingness to cooperate. Comparison Study:  no prior Performing Technologist: June Leap RDMS, RVT  Examination Guidelines: A complete evaluation includes B-mode imaging, spectral Doppler, color Doppler, and power Doppler as needed of all accessible portions of each vessel. Bilateral testing is considered an integral part of a complete examination. Limited examinations for reoccurring indications may be performed as noted.  Right Carotid Findings: +----------+--------+--------+--------+------------------+--------+           PSV cm/sEDV cm/sStenosisPlaque DescriptionComments +----------+--------+--------+--------+------------------+--------+ CCA Prox  77      19                                         +----------+--------+--------+--------+------------------+--------+ CCA Distal77      19                                         +----------+--------+--------+--------+------------------+--------+  ICA Prox  25      9       1-39%   homogeneous                +----------+--------+--------+--------+------------------+--------+ ICA Distal53      23                                         +----------+--------+--------+--------+------------------+--------+ ECA       98      16                                          +----------+--------+--------+--------+------------------+--------+ +----------+--------+-------+--------------+-------------------+           PSV cm/sEDV cmsDescribe      Arm Pressure (mmHG) +----------+--------+-------+--------------+-------------------+ Subclavian               Not identified                    +----------+--------+-------+--------------+-------------------+ +---------+--------+--+--------+-+---------+ VertebralPSV cm/s27EDV cm/s9Antegrade +---------+--------+--+--------+-+---------+  Left Carotid Findings: +----------+--------+--------+--------+------------------+--------+           PSV cm/sEDV cm/sStenosisPlaque DescriptionComments +----------+--------+--------+--------+------------------+--------+ CCA Prox  69      17                                         +----------+--------+--------+--------+------------------+--------+ CCA Distal48      15                                         +----------+--------+--------+--------+------------------+--------+ ICA Prox  17      5       1-39%   heterogenous               +----------+--------+--------+--------+------------------+--------+ ICA Distal39      15                                         +----------+--------+--------+--------+------------------+--------+ ECA       44      8                                          +----------+--------+--------+--------+------------------+--------+ +----------+--------+--------+--------------+-------------------+           PSV cm/sEDV cm/sDescribe      Arm Pressure (mmHG) +----------+--------+--------+--------------+-------------------+ Subclavian                Not identified                    +----------+--------+--------+--------------+-------------------+ +---------+--------+--+--------+-+---------+ VertebralPSV cm/s33EDV cm/s6Antegrade +---------+--------+--+--------+-+---------+  Summary: Right Carotid: Velocities in the right  ICA are consistent with a 1-39% stenosis. Left Carotid: Velocities in the left ICA are consistent with a 1-39% stenosis.  *See table(s) above for measurements and observations.  Electronically signed by Deitra Mayo MD on 07/06/2019 at 2:50:04 PM.    Final       Subjective: Patient was interviewed and examined along with nursing  in room.  Patient clearly told us that she did not want to go to any place for rehab and wanted to be discharged home.  She denied any complaints.  She specifically denied weakness, body aches, pain elsewhere, dizziness or lightheadedness.  She stated that she had been up in the room with assistance without complaints.  No AVH or SI or HI reported.  As per RN, no acute issues noted.  Discharge Exam:  Vitals:   07/08/19 1605 07/08/19 1639 07/08/19 2047 07/09/19 0518  BP:  125/75 (!) 137/92 127/89  Pulse: 97 98 76 (!) 112  Resp: (!) 23 20 20 20   Temp:  97.9 F (36.6 C) 98.2 F (36.8 C) 98.8 F (37.1 C)  TempSrc:  Oral Oral Oral  SpO2: 95% 97% 99% 98%  Weight:    81.6 kg  Height:        General exam: Pleasant young female, moderately built and obese comfortably supine in bed.  Oral mucosa moist. Respiratory system: Clear to auscultation. Respiratory effort normal. Cardiovascular system: S1 & S2 heard, RRR. No JVD, murmurs, rubs, gallops or clicks. No pedal edema.  Telemetry personally reviewed: Sinus rhythm. Gastrointestinal system: Abdomen is nondistended, soft and nontender. No organomegaly or masses felt. Normal bowel sounds heard. Central nervous system: Alert and oriented. No focal neurological deficits. Extremities: Symmetric 5 x 5 power. Skin: No rashes, lesions or ulcers Psychiatry: Judgement and insight appear normal. Mood & affect flat.    The results of significant diagnostics from this hospitalization (including imaging, microbiology, ancillary and laboratory) are listed below for reference.     Microbiology: Recent Results (from the past  240 hour(s))  SARS CORONAVIRUS 2 (TAT 6-24 HRS) Nasopharyngeal Nasopharyngeal Swab     Status: None   Collection Time: 07/05/19  1:15 AM   Specimen: Nasopharyngeal Swab  Result Value Ref Range Status   SARS Coronavirus 2 NEGATIVE NEGATIVE Final    Comment: (NOTE) SARS-CoV-2 target nucleic acids are NOT DETECTED. The SARS-CoV-2 RNA is generally detectable in upper and lower respiratory specimens during the acute phase of infection. Negative results do not preclude SARS-CoV-2 infection, do not rule out co-infections with other pathogens, and should not be used as the sole basis for treatment or other patient management decisions. Negative results must be combined with clinical observations, patient history, and epidemiological information. The expected result is Negative. Fact Sheet for Patients: SugarRoll.be Fact Sheet for Healthcare Providers: https://www.woods-mathews.com/ This test is not yet approved or cleared by the [place] FDA and  has been authorized for detection and/or diagnosis of SARS-CoV-2 by FDA under an Emergency Use Authorization (EUA). This EUA will remain  in effect (meaning this test can be used) for the duration of the COVID-19 declaration under Section 56 4(b)(1) of the Act, 21 U.S.C. section 360bbb-3(b)(1), unless the authorization is terminated or revoked sooner. Performed at [facility], [place] [address], [place], [place] [address]   MRSA PCR Screening     Status: None   Collection Time: 07/06/19  4:49 PM   Specimen: Nasal Mucosa; Nasopharyngeal  Result Value Ref Range Status   MRSA by PCR NEGATIVE NEGATIVE Final    Comment:        The GeneXpert MRSA Assay (FDA approved for NASAL specimens only), is one component of a comprehensive MRSA colonization surveillance program. It is not intended to diagnose MRSA infection nor to guide or monitor treatment for MRSA infections. Performed at [facility], [place],  [place] [address]      Labs: CBC: Recent Labs  Lab 07/04/19 2021  07/05/19 0301 07/06/19 0351 07/07/19 0340 07/08/19 0720 07/09/19 0337  WBC 4.8   < > 3.8* 6.3 6.0 6.0 4.5  NEUTROABS 2.8  --   --  3.9 4.2  --   --   HGB 14.8   < > 13.2 14.6 13.6 13.1 12.7  HCT 39.6   < > 36.1 39.8 37.8 36.3 35.4*  MCV 93.8   < > 94.0 93.4 96.4 95.3 94.7  PLT 100*   < > 78* 89* 65* 69* 74*   < > = values in this interval not displayed.   Basic Metabolic Panel: Recent Labs  Lab 07/05/19 0301 07/06/19 0351 07/07/19 0340 07/08/19 0720 07/09/19 0337  NA 137 137 140 137 138  K 3.3* 3.8 3.3* 3.5 3.1*  CL 107 107 113* 109 109  CO2 19* 18* 17* 18* 19*  GLUCOSE 98 108* 96 85 87  BUN <5* <5* 6 7 5*  CREATININE 0.60 0.67 0.59 0.71 0.62  CALCIUM 9.2 9.3 8.9 8.9 9.2  MG 1.6* 1.9 1.8  --  1.6*  PHOS 3.6  --  3.5  --  3.3   Liver Function Tests: Recent Labs  Lab 07/04/19 2021 07/05/19 0301 07/07/19 0340 07/09/19 0337  AST 112* 90* 80* 85*  ALT 70* 62* 50* 50*  ALKPHOS 58 53 48 51  BILITOT 0.9 0.8 1.5* 1.1  PROT 7.9 7.4 6.4* 6.6  ALBUMIN 3.6 3.3* 2.8* 2.9*   BNP (last 3 results) No results for input(s): BNP in the last 8760 hours. Cardiac Enzymes: Recent Labs  Lab 07/08/19 1547  CKTOTAL 656*   CBG: Recent Labs  Lab 07/05/19 0834 07/06/19 0639 07/07/19 0753 07/07/19 2204 07/08/19 0801  GLUCAP 106* 132* 90 92 87   Urinalysis    Component Value Date/Time   COLORURINE YELLOW 07/04/2019 2021   APPEARANCEUR CLEAR 07/04/2019 2021   LABSPEC 1.005 07/04/2019 2021   PHURINE 5.0 07/04/2019 2021   GLUCOSEU NEGATIVE 07/04/2019 2021   HGBUR NEGATIVE 07/04/2019 2021   [place] NEGATIVE 07/04/2019 2021   BILIRUBINUR negative 01/02/2017 [place] 07/04/2019 2021   PROTEINUR NEGATIVE 07/04/2019 2021   UROBILINOGEN 0.2 01/02/2017 1513   UROBILINOGEN 0.2 09/16/2014 1016   NITRITE NEGATIVE 07/04/2019 2021   LEUKOCYTESUR NEGATIVE  07/04/2019 2021   I discussed in detail with patient's spouse, updated care and answered questions.   Time coordinating discharge: 35 minutes  SIGNED:  Vernell Leep, MD, FACP, [facility]. [facility]  To contact the attending provider between 7A-7P or the covering provider during after hours 7P-7A, please log into the web site [email or web address] and access using universal [place] password for that web site. If you do not have the password, please call the hospital operator.

## 2019-07-11 LAB — VITAMIN B1: Vitamin B1 (Thiamine): 55.9 nmol/L — ABNORMAL LOW (ref 66.5–200.0)

## 2019-07-19 MED FILL — risperiDONE 1 MG TABS: 1 | 30 days supply | Qty: 30 | Fill #0

## 2019-07-19 MED FILL — ?ATORVASTATIN 40MG TABLET: 40 | 30 days supply | Qty: 30 | Fill #3

## 2019-07-26 ENCOUNTER — Other Ambulatory Visit: Payer: Self-pay | Admitting: Acute Care

## 2019-07-26 ENCOUNTER — Other Ambulatory Visit: Payer: Self-pay

## 2019-07-26 DIAGNOSIS — F17210 Nicotine dependence, cigarettes, uncomplicated: Secondary | ICD-10-CM

## 2019-07-26 DIAGNOSIS — Z122 Encounter for screening for malignant neoplasm of respiratory organs: Secondary | ICD-10-CM

## 2019-07-26 NOTE — Progress Notes (Signed)
Shared Decision-Making Visit for Lung Cancer Screening [phone] )  Lung Cancer Screening Criteria 78-44-years of age 56+ pack year smoking history (  No Recent History of coughing up blood   No Unexplained weight loss of > 15 pounds in the last 6 months. No Prior History Lung / other cancer  (Diagnosis within the last 5 years already requiring surveillance chest CT Scans). Pt is a current smoker, or former smoker who has quit within the last 15 years.  This patient meets the criterial noted above and is asymptomatic for any signs or symptoms of lung cancer.  The Shared Decision-Making Visit discussion included risks and benefits of screening, potential for follow up diagnostic testing for abnormal scans, potential for false positive tests, over diagnosis, and discussion about total radiation exposure. Patient stated willingness to undergo diagnostics and treatment as needed. Current smokers were counseled on smoking cessation as the single most powerful action they can take to decrease their risk of lung cancer, pulmonary disease, heart disease and stroke. They were given a resource card with information on receiving free nicotine replacement therapy , and information about free smoking cessation classes.  Pt understands that this scan is being paid for by a grant obtained by the [facility]. We discussed  that there is no guarantee of additional grant money from year to year as these grants are not guaranteed to programs on an annual basis.They have to be applied for and they are awarded based on county need, and utilization of the previous years funds.   Blue Ticket # 4 Ticket to Ride the scanner provided Be stronger than your excuses cards provided.  Magdalen Spatz, MSN, AGACNP-BC [place] Pager # [phone] After 4 pm please call [phone] 07/26/2019

## 2019-07-26 NOTE — Addendum Note (Signed)
Addended by: Doroteo Glassman D on: 07/26/2019 06:14 PM   Modules accepted: Orders

## 2019-07-26 NOTE — Addendum Note (Signed)
Addended by: Eric Form F on: 07/26/2019 06:12 PM   Modules accepted: Level of Service

## 2019-07-29 ENCOUNTER — Encounter (INDEPENDENT_AMBULATORY_CARE_PROVIDER_SITE_OTHER): Payer: Self-pay | Admitting: Primary Care

## 2019-07-29 ENCOUNTER — Other Ambulatory Visit: Payer: Self-pay

## 2019-07-29 ENCOUNTER — Ambulatory Visit (INDEPENDENT_AMBULATORY_CARE_PROVIDER_SITE_OTHER): Payer: Self-pay | Admitting: Primary Care

## 2019-07-29 DIAGNOSIS — F1020 Alcohol dependence, uncomplicated: Secondary | ICD-10-CM

## 2019-07-29 DIAGNOSIS — R55 Syncope and collapse: Secondary | ICD-10-CM

## 2019-07-29 DIAGNOSIS — Z09 Encounter for follow-up examination after completed treatment for conditions other than malignant neoplasm: Secondary | ICD-10-CM
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
## 2019-08-03 ENCOUNTER — Ambulatory Visit
Admission: RE | Admit: 2019-08-03 | Discharge: 2019-08-03 | Disposition: A | Discharge status: Home / Self Care | Payer: No Typology Code available for payment source | Source: Ambulatory Visit | Attending: Acute Care | Admitting: Acute Care

## 2019-08-03 DIAGNOSIS — F17210 Nicotine dependence, cigarettes, uncomplicated: Secondary | ICD-10-CM

## 2019-08-03 DIAGNOSIS — Z122 Encounter for screening for malignant neoplasm of respiratory organs: Secondary | ICD-10-CM

## 2019-08-05 ENCOUNTER — Telehealth: Payer: Self-pay | Admitting: *Deleted

## 2019-08-05 NOTE — Telephone Encounter (Signed)
Patient was call to arrange for Cardiac event monitor to evaluate heart rate and rhythm due to recent syncopal episode.  Patient does not have insurance at this time.  [employer] monitor company does offer a discounted cardiac event monitor for self pay patients.  The amount would be $225. If [employer] was contacted and paid within 7 days.  Patient declined the monitor at this time due to financial reasons.  She would like to wait until she receives her disability.  Patient would also like to discuss with PCP, Juluis Mire, NP, regarding necessity, since she is feeling better.

## 2019-08-17 MED FILL — ?RISPERIDONE 1MG TABS: 1 | 30 days supply | Qty: 30 | Fill #1

## 2019-08-17 MED FILL — ?ATORVASTATIN 40MG TABLET: 40 | 30 days supply | Qty: 30 | Fill #0

## 2019-08-24 ENCOUNTER — Ambulatory Visit (INDEPENDENT_AMBULATORY_CARE_PROVIDER_SITE_OTHER): Payer: Self-pay | Admitting: Primary Care

## 2019-08-24 ENCOUNTER — Encounter (INDEPENDENT_AMBULATORY_CARE_PROVIDER_SITE_OTHER): Payer: Self-pay | Admitting: Primary Care

## 2019-08-24 ENCOUNTER — Other Ambulatory Visit: Payer: Self-pay

## 2019-08-24 DIAGNOSIS — Z122 Encounter for screening for malignant neoplasm of respiratory organs: Secondary | ICD-10-CM
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
## 2019-09-17 MED FILL — ?ATORVASTATIN 40MG TABLET: 40 | 30 days supply | Qty: 30 | Fill #1

## 2019-09-17 MED FILL — ?RISPERIDONE 1MG TABS: 1 | 30 days supply | Qty: 30 | Fill #2

## 2019-10-13 MED FILL — ?ATORVASTATIN 40MG TABLET: 40 | 30 days supply | Qty: 30 | Fill #2

## 2019-10-13 MED FILL — risperiDONE 1 MG TABS: 1 | 30 days supply | Qty: 30 | Fill #3

## 2019-10-28 MED FILL — risperiDONE 1 MG TABS: 1 | 30 days supply | Qty: 30 | Fill #3

## 2019-11-23 MED FILL — ?ATORVASTATIN 40MG TABL: 40 | 30 days supply | Qty: 30 | Fill #3

## 2019-11-23 MED FILL — risperiDONE 1 MG TABS: 1 | 30 days supply | Qty: 30 | Fill #3

## 2019-11-24 ENCOUNTER — Ambulatory Visit (INDEPENDENT_AMBULATORY_CARE_PROVIDER_SITE_OTHER): Payer: Self-pay | Admitting: Primary Care

## 2019-11-24 ENCOUNTER — Other Ambulatory Visit: Payer: Self-pay

## 2019-11-24 ENCOUNTER — Encounter (INDEPENDENT_AMBULATORY_CARE_PROVIDER_SITE_OTHER): Payer: Self-pay | Admitting: Primary Care

## 2019-11-24 DIAGNOSIS — E782 Mixed hyperlipidemia: Secondary | ICD-10-CM

## 2019-11-24 DIAGNOSIS — R55 Syncope and collapse: Secondary | ICD-10-CM

## 2019-11-24 DIAGNOSIS — F3175 Bipolar disorder, in partial remission, most recent episode depressed: Secondary | ICD-10-CM

## 2019-11-24 DIAGNOSIS — F1020 Alcohol dependence, uncomplicated: Secondary | ICD-10-CM

## 2019-11-24 DIAGNOSIS — Z09 Encounter for follow-up examination after completed treatment for conditions other than malignant neoplasm: Secondary | ICD-10-CM
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
## 2019-11-25 ENCOUNTER — Other Ambulatory Visit (INDEPENDENT_AMBULATORY_CARE_PROVIDER_SITE_OTHER): Payer: Self-pay | Admitting: Primary Care

## 2019-11-25 ENCOUNTER — Other Ambulatory Visit (INDEPENDENT_AMBULATORY_CARE_PROVIDER_SITE_OTHER): Payer: Self-pay

## 2019-11-25 NOTE — Addendum Note (Signed)
Addended by: Renaldo Reel on: 11/25/2019 11:20 AM   Modules accepted: Orders

## 2019-11-26 LAB — CMP14+EGFR
ALT: 31 IU/L (ref 0–32)
AST: 48 IU/L — ABNORMAL HIGH (ref 0–40)
Albumin/Globulin Ratio: 1.3 (ref 1.2–2.2)
Albumin: 4.4 g/dL (ref 3.8–4.9)
Alkaline Phosphatase: 84 IU/L (ref 39–117)
BUN/Creatinine Ratio: 9 (ref 9–23)
BUN: 7 mg/dL (ref 6–24)
Bilirubin Total: 0.8 mg/dL (ref 0.0–1.2)
CO2: 18 mmol/L — ABNORMAL LOW (ref 20–29)
Calcium: 9.9 mg/dL (ref 8.7–10.2)
Chloride: 101 mmol/L (ref 96–106)
Creatinine, Ser: 0.77 mg/dL (ref 0.57–1.00)
GFR calc Af Amer: 100 mL/min/{1.73_m2} (ref >59)
GFR calc non Af Amer: 87 mL/min/{1.73_m2} (ref >59)
Globulin, Total: 3.4 g/dL (ref 1.5–4.5)
Glucose: 101 mg/dL — ABNORMAL HIGH (ref 65–99)
Potassium: 4.2 mmol/L (ref 3.5–5.2)
Sodium: 139 mmol/L (ref 134–144)
Total Protein: 7.8 g/dL (ref 6.0–8.5)

## 2019-11-26 LAB — CBC WITH DIFFERENTIAL/PLATELET
Basophils Absolute: 0 10*3/uL (ref 0.0–0.2)
Basos: 1 %
EOS (ABSOLUTE): 0.2 10*3/uL (ref 0.0–0.4)
Eos: 3 %
Hematocrit: 41.8 % (ref 34.0–46.6)
Hemoglobin: 14.9 g/dL (ref 11.1–15.9)
Immature Grans (Abs): 0 10*3/uL (ref 0.0–0.1)
Immature Granulocytes: 0 %
Lymphocytes Absolute: 3.1 10*3/uL (ref 0.7–3.1)
Lymphs: 48 %
MCH: 33.1 pg — ABNORMAL HIGH (ref 26.6–33.0)
MCHC: 35.6 g/dL (ref 31.5–35.7)
MCV: 93 fL (ref 79–97)
Monocytes Absolute: 0.5 10*3/uL (ref 0.1–0.9)
Monocytes: 8 %
Neutrophils Absolute: 2.5 10*3/uL (ref 1.4–7.0)
Neutrophils: 40 %
Platelets: 99 10*3/uL — CL (ref 150–450)
RBC: 4.5 x10E6/uL (ref 3.77–5.28)
RDW: 13.2 % (ref 11.7–15.4)
WBC: 6.3 10*3/uL (ref 3.4–10.8)

## 2019-11-26 LAB — LIPID PANEL
Chol/HDL Ratio: 2 ratio (ref 0.0–4.4)
Cholesterol, Total: 211 mg/dL — ABNORMAL HIGH (ref 100–199)
HDL: 106 mg/dL (ref >39)
LDL Chol Calc (NIH): 89 mg/dL (ref 0–99)
Triglycerides: 96 mg/dL (ref 0–149)
VLDL Cholesterol Cal: 16 mg/dL (ref 5–40)

## 2019-11-26 LAB — CK: Total CK: 78 U/L (ref 32–182)

## 2019-11-26 LAB — MAGNESIUM: Magnesium: 1.9 mg/dL (ref 1.6–2.3)

## 2019-11-29 ENCOUNTER — Other Ambulatory Visit (INDEPENDENT_AMBULATORY_CARE_PROVIDER_SITE_OTHER): Payer: Self-pay | Admitting: Primary Care

## 2019-11-29 DIAGNOSIS — E7841 Elevated Lipoprotein(a): Secondary | ICD-10-CM

## 2019-11-29 MED ORDER — ATORVASTATIN CALCIUM 20 MG PO TABS: 40.0000 mg | ORAL_TABLET | Freq: Every day | ORAL | 1 refills | Status: DC

## 2019-11-30 ENCOUNTER — Telehealth (INDEPENDENT_AMBULATORY_CARE_PROVIDER_SITE_OTHER): Payer: Self-pay

## 2019-11-30 NOTE — Telephone Encounter (Signed)
-----   Message from Kerin Perna, NP sent at 11/29/2019  9:22 PM EST ----- Low platelet count has improved however caused maybe contributed to alcohol abuse and elevate AST (a liver enzyme) Cholesterol panel elevated cholesterol sent in atorvastatin 20mg  take at night and decrease fatty foods

## 2019-11-30 NOTE — Telephone Encounter (Signed)
Patient is aware of Low platelet count has improved however caused maybe contributed to alcohol abuse and elevate AST (a liver enzyme) Cholesterol panel elevated cholesterol sent in atorvastatin 20mg  take at night and decrease fatty food. She verbalized. Nat Christen, CMA

## 2019-12-27 ENCOUNTER — Other Ambulatory Visit (INDEPENDENT_AMBULATORY_CARE_PROVIDER_SITE_OTHER): Payer: Self-pay | Admitting: Primary Care

## 2019-12-27 ENCOUNTER — Telehealth (INDEPENDENT_AMBULATORY_CARE_PROVIDER_SITE_OTHER): Payer: Self-pay

## 2019-12-27 DIAGNOSIS — Z760 Encounter for issue of repeat prescription: Secondary | ICD-10-CM

## 2019-12-27 MED FILL — ?ATORVASTATIN 20 MG TABLET: 20 | 30 days supply | Qty: 60 | Fill #0

## 2019-12-27 NOTE — Telephone Encounter (Signed)
Patient called to make a medication refill for   risperiDONE (RISPERDAL) 1 MG tablet   Patient uses  [facility]   Please advice [phone]

## 2019-12-27 NOTE — Telephone Encounter (Signed)
Sent to PCP ?

## 2019-12-28 ENCOUNTER — Other Ambulatory Visit (INDEPENDENT_AMBULATORY_CARE_PROVIDER_SITE_OTHER): Payer: Self-pay | Admitting: Primary Care

## 2019-12-28 MED FILL — risperiDONE 1 MG TABS: 1 | 30 days supply | Qty: 30 | Fill #0

## 2020-01-06 ENCOUNTER — Ambulatory Visit (INDEPENDENT_AMBULATORY_CARE_PROVIDER_SITE_OTHER): Payer: Self-pay | Admitting: Primary Care

## 2020-01-06 ENCOUNTER — Encounter (INDEPENDENT_AMBULATORY_CARE_PROVIDER_SITE_OTHER): Payer: Self-pay | Admitting: Primary Care

## 2020-01-06 ENCOUNTER — Other Ambulatory Visit: Payer: Self-pay

## 2020-01-06 VITALS — BP 127/88 | HR 90 | Temp 97.3°F | Ht 64.0 in | Wt 184.0 lb

## 2020-01-06 DIAGNOSIS — F1020 Alcohol dependence, uncomplicated: Secondary | ICD-10-CM

## 2020-01-06 DIAGNOSIS — T17310A Gastric contents in larynx causing asphyxiation, initial encounter: Secondary | ICD-10-CM

## 2020-01-06 DIAGNOSIS — F17200 Nicotine dependence, unspecified, uncomplicated: Secondary | ICD-10-CM

## 2020-01-06 MED ORDER — LORATADINE 10 MG PO TABS: 10.0000 mg | ORAL_TABLET | Freq: Every day | ORAL | 11 refills | Status: DC

## 2020-01-06 MED ORDER — DM-GUAIFENESIN ER 30-600 MG PO TB12: 1.0000 | ORAL_TABLET | Freq: Two times a day (BID) | ORAL | 2 refills | Status: DC | PRN

## 2020-01-06 NOTE — Progress Notes (Signed)
Pt complains of swelling in neck and little knots form  She is no longer having the pinching feeling but wants to know where it comes from
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
## 2020-01-06 NOTE — Patient Instructions (Signed)
Use Flonase nasal spray for at least duration of your allergy season.  °- For appropriate administration of the nasal spray, clear the nose, use opposite hand for opposite nare, sniff gently, exhale through your mouth. °- For maximal effect take these two nasal sprays at least 30 minutes apart. °- Continue Allegra, Claritin or Zyrtec each day, as needed. °- Drink at least 64 ounces of water each day. °- If you have a humidifier use it nightly. °- Remove as many irritants/allergies as you are able to, no pets in the bedroom, change air filters in air vents.  °

## 2020-01-26 MED FILL — risperiDONE 1 MG TABS: 1 | 30 days supply | Qty: 30 | Fill #1

## 2020-01-26 MED FILL — ?ATORVASTATIN 20 MG TABLET: 20 | 30 days supply | Qty: 60 | Fill #1

## 2020-03-03 MED FILL — risperiDONE 1 MG TABS: 1 | 30 days supply | Qty: 30 | Fill #2

## 2020-03-03 MED FILL — ?ATORVASTATIN 20 MG TABLET: 20 | 30 days supply | Qty: 60 | Fill #2

## 2020-03-31 ENCOUNTER — Other Ambulatory Visit (INDEPENDENT_AMBULATORY_CARE_PROVIDER_SITE_OTHER): Payer: Self-pay | Admitting: Primary Care

## 2020-03-31 DIAGNOSIS — E7841 Elevated Lipoprotein(a): Secondary | ICD-10-CM

## 2020-03-31 MED FILL — risperiDONE 1 MG TABS: 1 | 30 days supply | Qty: 30 | Fill #3

## 2020-04-03 MED FILL — ?ATORVASTATIN 20 MG TABLET: 20 | 30 days supply | Qty: 60 | Fill #0

## 2020-04-04 ENCOUNTER — Telehealth (INDEPENDENT_AMBULATORY_CARE_PROVIDER_SITE_OTHER): Payer: Self-pay | Admitting: Primary Care

## 2020-05-02 ENCOUNTER — Other Ambulatory Visit (INDEPENDENT_AMBULATORY_CARE_PROVIDER_SITE_OTHER): Payer: Self-pay | Admitting: Primary Care

## 2020-05-02 DIAGNOSIS — Z760 Encounter for issue of repeat prescription: Secondary | ICD-10-CM

## 2020-05-02 MED FILL — ?ATORVASTATIN 20 MG TABLET: 20 | 30 days supply | Qty: 60 | Fill #1

## 2020-05-02 NOTE — Telephone Encounter (Signed)
Need follow up appointment

## 2020-05-02 NOTE — Telephone Encounter (Signed)
Requested medication (s) are due for refill today: yes  Requested medication (s) are on the active medication list: yes  Last refill:  12/28/19 #30 3 refills  Future visit scheduled: no  Notes to clinic:  not delegated per protocol     Requested Prescriptions  Pending Prescriptions Disp Refills   risperiDONE (RISPERDAL) 1 MG tablet [Pharmacy Med Name: risperiDONE 1 MG TABS 1 Tablet] 30 tablet 3    Sig: TAKE 1 TABLET (1 MG TOTAL) BY MOUTH AT BEDTIME.      Not Delegated - Psychiatry:  Antipsychotics - Second Generation (Atypical) - risperidone Failed - 05/02/2020  1:30 PM      Failed - This refill cannot be delegated      Failed - Prolactin Level (serum) in normal range and within 180 days    No results found for: PROLACTIN, TOTPROLACTIN, LABPROL        Failed - AST in normal range and within 180 days    AST  Date Value Ref Range Status  11/25/2019 48 (H) 0 - 40 IU/L Final          Passed - ALT in normal range and within 180 days    ALT  Date Value Ref Range Status  11/25/2019 31 0 - 32 IU/L Final          Passed - Valid encounter within last 6 months    Recent Outpatient Visits           3 months ago Choking due to phlegm in larynx, initial encounter   [place], Michelle P, NP   5 months ago Mixed hyperlipidemia   [place], Michelle P, NP   8 months ago Encounter for screening for lung cancer   [place], Michelle P, NP   9 months ago Hospital discharge follow-up   [place] Kerin Perna, NP   10 months ago Productive cough   [place] Kerin Perna, NP

## 2020-05-02 NOTE — Telephone Encounter (Signed)
Please fill if appropriate.  

## 2020-05-03 ENCOUNTER — Emergency Department (HOSPITAL_COMMUNITY): Payer: Medicaid Other

## 2020-05-03 ENCOUNTER — Encounter (HOSPITAL_COMMUNITY): Payer: Self-pay | Admitting: Emergency Medicine

## 2020-05-03 ENCOUNTER — Emergency Department (HOSPITAL_COMMUNITY)
Admission: EM | Admit: 2020-05-03 | Discharge: 2020-05-04 | Disposition: A | Discharge status: Home / Self Care | Payer: Medicaid Other | Attending: Emergency Medicine | Admitting: Emergency Medicine

## 2020-05-03 DIAGNOSIS — F17210 Nicotine dependence, cigarettes, uncomplicated: Secondary | ICD-10-CM | POA: Insufficient documentation

## 2020-05-03 DIAGNOSIS — R112 Nausea with vomiting, unspecified: Secondary | ICD-10-CM | POA: Diagnosis not present

## 2020-05-03 DIAGNOSIS — R1030 Lower abdominal pain, unspecified: Secondary | ICD-10-CM | POA: Diagnosis not present

## 2020-05-03 LAB — COMPREHENSIVE METABOLIC PANEL
ALT: 43 U/L (ref 0–44)
AST: 82 U/L — ABNORMAL HIGH (ref 15–41)
Albumin: 4.3 g/dL (ref 3.5–5.0)
Alkaline Phosphatase: 63 U/L (ref 38–126)
Anion gap: 12 (ref 5–15)
BUN: 10 mg/dL (ref 6–20)
CO2: 27 mmol/L (ref 22–32)
Calcium: 9.9 mg/dL (ref 8.9–10.3)
Chloride: 96 mmol/L — ABNORMAL LOW (ref 98–111)
Creatinine, Ser: 0.71 mg/dL (ref 0.44–1.00)
GFR calc Af Amer: >60 mL/min (ref >60)
GFR calc non Af Amer: >60 mL/min (ref >60)
Glucose, Bld: 101 mg/dL — ABNORMAL HIGH (ref 70–99)
Potassium: 3.8 mmol/L (ref 3.5–5.1)
Sodium: 135 mmol/L (ref 135–145)
Total Bilirubin: 1.2 mg/dL (ref 0.3–1.2)
Total Protein: 8.5 g/dL — ABNORMAL HIGH (ref 6.5–8.1)

## 2020-05-03 LAB — CBC
HCT: 42.4 % (ref 36.0–46.0)
Hemoglobin: 15.3 g/dL — ABNORMAL HIGH (ref 12.0–15.0)
MCH: 33 pg (ref 26.0–34.0)
MCHC: 36.1 g/dL — ABNORMAL HIGH (ref 30.0–36.0)
MCV: 91.6 fL (ref 80.0–100.0)
Platelets: 103 10*3/uL — ABNORMAL LOW (ref 150–400)
RBC: 4.63 MIL/uL (ref 3.87–5.11)
RDW: 14 % (ref 11.5–15.5)
WBC: 6.2 10*3/uL (ref 4.0–10.5)
nRBC: 0 % (ref 0.0–0.2)

## 2020-05-03 LAB — URINALYSIS, ROUTINE W REFLEX MICROSCOPIC
Bilirubin Urine: NEGATIVE
Glucose, UA: NEGATIVE mg/dL
Ketones, ur: 5 mg/dL — AB
Nitrite: NEGATIVE
Protein, ur: NEGATIVE mg/dL
Specific Gravity, Urine: 1.004 — ABNORMAL LOW (ref 1.005–1.030)
pH: 6 (ref 5.0–8.0)

## 2020-05-03 LAB — LIPASE, BLOOD: Lipase: 55 U/L — ABNORMAL HIGH (ref 11–51)

## 2020-05-03 MED ORDER — FENTANYL CITRATE (PF) 100 MCG/2ML IJ SOLN
50.0000 ug | Freq: Once | INTRAMUSCULAR | Status: AC
  Administered 2020-05-03: 50 ug via INTRAVENOUS
  Filled 2020-05-03: qty 2

## 2020-05-03 MED ORDER — ONDANSETRON 4 MG PO TBDP: 4.0000 mg | ORAL_TABLET | Freq: Three times a day (TID) | ORAL | 0 refills | Status: DC | PRN

## 2020-05-03 MED ORDER — SODIUM CHLORIDE 0.9 % IV BOLUS
500.0000 mL | Freq: Once | INTRAVENOUS | Status: AC
  Administered 2020-05-03: 500 mL via INTRAVENOUS

## 2020-05-03 MED ORDER — SODIUM CHLORIDE 0.9% FLUSH: 3.0000 mL | Freq: Once | INTRAVENOUS | Status: DC

## 2020-05-03 MED ORDER — IOHEXOL 300 MG/ML  SOLN
100.0000 mL | Freq: Once | INTRAMUSCULAR | Status: AC | PRN
  Administered 2020-05-03: 100 mL via INTRAVENOUS

## 2020-05-03 MED ORDER — FLUCONAZOLE 100 MG PO TABS
100.0000 mg | ORAL_TABLET | Freq: Once | ORAL | Status: AC
  Administered 2020-05-03: 100 mg via ORAL
  Filled 2020-05-03: qty 1

## 2020-05-03 MED FILL — risperiDONE 1 MG TABS: 1 | 30 days supply | Qty: 30 | Fill #0

## 2020-05-03 NOTE — ED Notes (Signed)
Pt sts she is still unable to give a urine sample at this time

## 2020-05-03 NOTE — ED Triage Notes (Signed)
Per pt, states lower abdominal pain, pelvic pain and B/L leg pain that started 2 days ago-slight nausea
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
## 2020-05-03 NOTE — Telephone Encounter (Signed)
Will forward to [place]

## 2020-05-03 NOTE — Discharge Instructions (Addendum)
The cause of your abdominal pain was not identified today. You can take ibuprofen, available over-the-counter according to label instructions as needed for pain. Drink plenty of fluids. Get rechecked if you have any new or concerning symptoms.  You had a CT scan performed of your abdomen today that showed thickening in your uterus. [facility].

## 2020-06-01 ENCOUNTER — Other Ambulatory Visit (INDEPENDENT_AMBULATORY_CARE_PROVIDER_SITE_OTHER): Payer: Self-pay | Admitting: Family Medicine

## 2020-06-01 ENCOUNTER — Other Ambulatory Visit (INDEPENDENT_AMBULATORY_CARE_PROVIDER_SITE_OTHER): Payer: Self-pay | Admitting: Primary Care

## 2020-06-01 DIAGNOSIS — E7841 Elevated Lipoprotein(a): Secondary | ICD-10-CM

## 2020-06-01 DIAGNOSIS — Z760 Encounter for issue of repeat prescription: Secondary | ICD-10-CM

## 2020-06-01 MED FILL — ATORVASTATIN CALCIUM 20 MG: 20 | 30 days supply | Qty: 60 | Fill #0

## 2020-06-01 NOTE — Telephone Encounter (Signed)
Requested medication (s) are due for refill today: yes  Requested medication (s) are on the active medication list: yes  Last refill:  05/02/20  #30  0 refills  Future visit scheduled: No  Notes to clinic:  medication not delegated. AST on 05/03/20 82, no prolactin checked    Requested Prescriptions  Pending Prescriptions Disp Refills   risperiDONE (RISPERDAL) 1 MG tablet [Pharmacy Med Name: risperiDONE 1 MG TABS 1 Tablet] 30 tablet 0    Sig: TAKE 1 TABLET (1 MG TOTAL) BY MOUTH AT BEDTIME.      Not Delegated - Psychiatry:  Antipsychotics - Second Generation (Atypical) - risperidone Failed - 06/01/2020  9:15 AM      Failed - This refill cannot be delegated      Failed - Prolactin Level (serum) in normal range and within 180 days    No results found for: PROLACTIN, TOTPROLACTIN, LABPROL        Failed - AST in normal range and within 180 days    AST  Date Value Ref Range Status  05/03/2020 82 (H) 15 - 41 U/L Final          Passed - ALT in normal range and within 180 days    ALT  Date Value Ref Range Status  05/03/2020 43 0 - 44 U/L Final          Passed - Valid encounter within last 6 months    Recent Outpatient Visits           4 months ago Choking due to phlegm in larynx, initial encounter   [place], Michelle P, NP   6 months ago Mixed hyperlipidemia   [place], Michelle P, NP   9 months ago Encounter for screening for lung cancer   [place], Michelle P, NP   10 months ago Hospital discharge follow-up   [place] Kerin Perna, NP   11 months ago Productive cough   [place] Kerin Perna, NP

## 2020-06-02 MED FILL — risperiDONE 1 MG TABS: 1 | 30 days supply | Qty: 30 | Fill #0

## 2020-06-20 ENCOUNTER — Ambulatory Visit: Payer: Self-pay | Admitting: Obstetrics & Gynecology

## 2020-06-30 ENCOUNTER — Other Ambulatory Visit (INDEPENDENT_AMBULATORY_CARE_PROVIDER_SITE_OTHER): Payer: Self-pay | Admitting: Family Medicine

## 2020-06-30 DIAGNOSIS — Z760 Encounter for issue of repeat prescription: Secondary | ICD-10-CM

## 2020-06-30 MED FILL — ATORVASTATIN CALCIUM 20 MG: 20 | 30 days supply | Qty: 60 | Fill #1

## 2020-06-30 NOTE — Telephone Encounter (Signed)
Requested medication (s) are due for refill today: yes  Requested medication (s) are on the active medication list: yes  Last refill: 06/02/2020  Future visit scheduled: yes  Notes to clinic: this refill cannot be delegated    Requested Prescriptions  Pending Prescriptions Disp Refills   risperiDONE (RISPERDAL) 1 MG tablet [Pharmacy Med Name: risperiDONE 1 MG TABS 1 Tablet] 30 tablet 0    Sig: TAKE 1 TABLET (1 MG TOTAL) BY MOUTH AT BEDTIME. NEED OFFICE VISIT FOR MORE REFILLS      Not Delegated - Psychiatry:  Antipsychotics - Second Generation (Atypical) - risperidone Failed - 06/30/2020  2:51 PM      Failed - This refill cannot be delegated      Failed - Prolactin Level (serum) in normal range and within 180 days    No results found for: PROLACTIN, TOTPROLACTIN, LABPROL        Failed - AST in normal range and within 180 days    AST  Date Value Ref Range Status  05/03/2020 82 (H) 15 - 41 U/L Final          Passed - ALT in normal range and within 180 days    ALT  Date Value Ref Range Status  05/03/2020 43 0 - 44 U/L Final          Passed - Valid encounter within last 6 months    Recent Outpatient Visits           5 months ago Choking due to phlegm in larynx, initial encounter   [place], Michelle P, NP   7 months ago Mixed hyperlipidemia   [place], Michelle P, NP   10 months ago Encounter for screening for lung cancer   [place], Michelle P, NP   11 months ago Hospital discharge follow-up   [place] Kerin Perna, NP   1 year ago Productive cough   [facility] Kerin Perna, NP       Future Appointments             In 2 weeks Oletta Lamas, Milford Cage, NP [place]

## 2020-07-03 MED FILL — ATORVASTATIN CALCIUM 20 MG: 20 | 30 days supply | Qty: 60 | Fill #1

## 2020-07-04 ENCOUNTER — Encounter (INDEPENDENT_AMBULATORY_CARE_PROVIDER_SITE_OTHER): Payer: Medicaid Other | Admitting: Primary Care

## 2020-07-11 MED FILL — risperiDONE 1 MG TABS: 1 | 30 days supply | Qty: 30 | Fill #0

## 2020-07-18 ENCOUNTER — Other Ambulatory Visit: Payer: Self-pay

## 2020-07-18 ENCOUNTER — Other Ambulatory Visit (INDEPENDENT_AMBULATORY_CARE_PROVIDER_SITE_OTHER): Payer: Self-pay | Admitting: Primary Care

## 2020-07-18 ENCOUNTER — Encounter (INDEPENDENT_AMBULATORY_CARE_PROVIDER_SITE_OTHER): Payer: Self-pay | Admitting: Primary Care

## 2020-07-18 ENCOUNTER — Ambulatory Visit (INDEPENDENT_AMBULATORY_CARE_PROVIDER_SITE_OTHER): Payer: Medicaid Other | Admitting: Primary Care

## 2020-07-18 VITALS — BP 129/87 | HR 88 | Temp 97.3°F | Ht 64.0 in | Wt 187.6 lb

## 2020-07-18 DIAGNOSIS — Z1211 Encounter for screening for malignant neoplasm of colon: Secondary | ICD-10-CM

## 2020-07-18 DIAGNOSIS — E782 Mixed hyperlipidemia: Secondary | ICD-10-CM | POA: Diagnosis not present

## 2020-07-18 DIAGNOSIS — Z0000 Encounter for general adult medical examination without abnormal findings: Secondary | ICD-10-CM

## 2020-07-18 DIAGNOSIS — Z131 Encounter for screening for diabetes mellitus: Secondary | ICD-10-CM

## 2020-07-18 DIAGNOSIS — M25559 Pain in unspecified hip: Secondary | ICD-10-CM

## 2020-07-18 DIAGNOSIS — Z760 Encounter for issue of repeat prescription: Secondary | ICD-10-CM

## 2020-07-18 DIAGNOSIS — F1020 Alcohol dependence, uncomplicated: Secondary | ICD-10-CM | POA: Diagnosis not present

## 2020-07-18 DIAGNOSIS — E7841 Elevated Lipoprotein(a): Secondary | ICD-10-CM

## 2020-07-18 MED ORDER — RISPERIDONE 1 MG PO TABS: 1.0000 mg | ORAL_TABLET | Freq: Every day | ORAL | 1 refills | Status: DC

## 2020-07-18 MED ORDER — ATORVASTATIN CALCIUM 20 MG PO TABS: 40.0000 mg | ORAL_TABLET | Freq: Every day | ORAL | 1 refills | Status: DC

## 2020-07-18 NOTE — Patient Instructions (Signed)
Health Maintenance, Female Adopting a healthy lifestyle and getting preventive care are important in promoting health and wellness. Ask your health care provider about:  The right schedule for you to have regular tests and exams.  Things you can do on your own to prevent diseases and keep yourself healthy. What should I know about diet, weight, and exercise? Eat a healthy diet   Eat a diet that includes plenty of vegetables, fruits, low-fat dairy products, and lean protein.  Do not eat a lot of foods that are high in solid fats, added sugars, or sodium. Maintain a healthy weight Body mass index (BMI) is used to identify weight problems. It estimates body fat based on height and weight. Your health care provider can help determine your BMI and help you achieve or maintain a healthy weight. Get regular exercise Get regular exercise. This is one of the most important things you can do for your health. Most adults should:  Exercise for at least 150 minutes each week. The exercise should increase your heart rate and make you sweat (moderate-intensity exercise).  Do strengthening exercises at least twice a week. This is in addition to the moderate-intensity exercise.  Spend less time sitting. Even light physical activity can be beneficial. Watch cholesterol and blood lipids Have your blood tested for lipids and cholesterol at 57 years of age, then have this test every 5 years. Have your cholesterol levels checked more often if:  Your lipid or cholesterol levels are high.  You are older than 57 years of age.  You are at high risk for heart disease. What should I know about cancer screening? Depending on your health history and family history, you may need to have cancer screening at various ages. This may include screening for:  Breast cancer.  Cervical cancer.  Colorectal cancer.  Skin cancer.  Lung cancer. What should I know about heart disease, diabetes, and high blood  pressure? Blood pressure and heart disease  High blood pressure causes heart disease and increases the risk of stroke. This is more likely to develop in people who have high blood pressure readings, are of African descent, or are overweight.  Have your blood pressure checked: ? Every 3-5 years if you are 18-39 years of age. ? Every year if you are 40 years old or older. Diabetes Have regular diabetes screenings. This checks your fasting blood sugar level. Have the screening done:  Once every three years after age 40 if you are at a normal weight and have a low risk for diabetes.  More often and at a younger age if you are overweight or have a high risk for diabetes. What should I know about preventing infection? Hepatitis B If you have a higher risk for hepatitis B, you should be screened for this virus. Talk with your health care provider to find out if you are at risk for hepatitis B infection. Hepatitis C Testing is recommended for:  Everyone born from 1945 through 1965.  Anyone with known risk factors for hepatitis C. Sexually transmitted infections (STIs)  Get screened for STIs, including gonorrhea and chlamydia, if: ? You are sexually active and are younger than 57 years of age. ? You are older than 57 years of age and your health care provider tells you that you are at risk for this type of infection. ? Your sexual activity has changed since you were last screened, and you are at increased risk for chlamydia or gonorrhea. Ask your health care provider if   you are at risk.  Ask your health care provider about whether you are at high risk for HIV. Your health care provider may recommend a prescription medicine to help prevent HIV infection. If you choose to take medicine to prevent HIV, you should first get tested for HIV. You should then be tested every 3 months for as long as you are taking the medicine. Pregnancy  If you are about to stop having your period (premenopausal) and  you may become pregnant, seek counseling before you get pregnant.  Take 400 to 800 micrograms (mcg) of folic acid every day if you become pregnant.  Ask for birth control (contraception) if you want to prevent pregnancy. Osteoporosis and menopause Osteoporosis is a disease in which the bones lose minerals and strength with aging. This can result in bone fractures. If you are 65 years old or older, or if you are at risk for osteoporosis and fractures, ask your health care provider if you should:  Be screened for bone loss.  Take a calcium or vitamin D supplement to lower your risk of fractures.  Be given hormone replacement therapy (HRT) to treat symptoms of menopause. Follow these instructions at home: Lifestyle  Do not use any products that contain nicotine or tobacco, such as cigarettes, e-cigarettes, and chewing tobacco. If you need help quitting, ask your health care provider.  Do not use street drugs.  Do not share needles.  Ask your health care provider for help if you need support or information about quitting drugs. Alcohol use  Do not drink alcohol if: ? Your health care provider tells you not to drink. ? You are pregnant, may be pregnant, or are planning to become pregnant.  If you drink alcohol: ? Limit how much you use to 0-1 drink a day. ? Limit intake if you are breastfeeding.  Be aware of how much alcohol is in your drink. In the [place] one drink equals one 12 oz bottle of beer (355 mL), one 5 oz glass of wine (148 mL), or one 1 oz glass of hard liquor (44 mL). General instructions  Schedule regular health, dental, and eye exams.  Stay current with your vaccines.  Tell your health care provider if: ? You often feel depressed. ? You have ever been abused or do not feel safe at home. Summary  Adopting a healthy lifestyle and getting preventive care are important in promoting health and wellness.  Follow your health care provider's instructions about healthy  diet, exercising, and getting tested or screened for diseases.  Follow your health care provider's instructions on monitoring your cholesterol and blood pressure. This information is not intended to replace advice given to you by your health care provider. Make sure you discuss any questions you have with your health care provider. Document Revised: 09/23/2018 Document Reviewed: 09/23/2018 Elsevier Patient Education  2020 [employer]  

## 2020-07-18 NOTE — Progress Notes (Signed)
[place]    [name] is a 59 y.o. female presents to office today for annual physical exam examination.    Concerns today include: 1. Pelvic pain would like a referral to GYN   Occupation: disable , Marital status: Single , Substance use: smokes tobacco and drinks beers Diet: no, Exercise: walk Last eye exam: July 2021  Last dental exam: unknown  Last colonoscopy: schedule with GI Refills needed today: done  Immunizations needed: Flu Vaccine: no  Tdap Vaccine: no  - every 32yrs - (<3 lifetime doses or unknown): all wounds -- look up need for Tetanus IG - (>=3 lifetime doses): clean/minor wound if >62yrs from previous; all other wounds if >48yrs from previous Zoster Vaccine: no (those >50yo, once) Pneumonia Vaccine: no (those w/ risk factors) - (<91yr) Both: Immunocompromised, cochlear implant, CSF leak, asplenic, sickle cell, Chronic Renal Failure - (<24yr) PPSV-23 only: Heart dz, lung disease, DM, tobacco abuse, alcoholism, cirrhosis/liver disease. 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  STates this is when she started drinking heaviily   Bipolar 1 disorder ([place])    Seizures ([place])    Trichimoniasis 2010   Social History   Socioeconomic History   Marital status: Single    Spouse name: Not on file   Number of children: 6   Years of education: Not on file   Highest education level: Not on file  Occupational History   Occupation: Nurses Aide in past.    Comment: unemployed since eye injury about 1 year ago.  Tobacco Use   Smoking status: Current Every Day Smoker    Packs/day: 0.75    Years: 43.00    Pack years: 32.25    Start date: 02/24/1976   Smokeless tobacco: Never Used   Tobacco comment: Working on it.   Vaping Use   Vaping Use: Never used  Substance and Sexual Activity   Alcohol use: Yes    Alcohol/week: 8.0 standard drinks    Types: 8 Standard drinks or equivalent per week    Comment: daily use   Drug use: Yes    Types: Marijuana   Sexual activity: Not Currently    Birth control/protection: None  Other Topics Concern   Not on file  Social History Narrative   Originally from [place], but moved to [place] at age 69 yo.     Lived with mother in [place].   Younger sister killed there--the man who was thought to be the killer was murdered in front of the patient's home by some of her sister's friends.   Older sister is felt to have been kidnapped by her [place] husband and take to [place]--occurred 5 years ago.   Middle sister infected with HIV from her husband.    Pt. Lives with a female platonic friend.    Social Determinants of Health   Financial Resource Strain:    Difficulty of Paying Living Expenses: Not on file  Food Insecurity:    Worried About Charity fundraiser in the Last Year: Not on file   [employer] of Food in the Last Year: Not on file  Transportation Needs:    Lack of Transportation (Medical): Not on file   Lack of Transportation (Non-Medical): Not on file  Physical Activity:    Days of Exercise per Week: Not on file   Minutes of Exercise per Session: Not on file  Stress:    Feeling of Stress : Not on file  Social Connections:    Frequency of Communication with Friends and Family: Not on file   Frequency of Social Gatherings with Friends and Family: Not on file   Attends Religious Services: Not on file   Active Member of Clubs or Organizations: Not on file   Attends Archivist Meetings: Not on file   Marital Status: Not on file  Intimate Partner Violence:    Fear of Current or Ex-Partner: Not on file   Emotionally Abused: Not on file   Physically Abused: Not on file   Sexually Abused: Not on file   Past Surgical  History:  Procedure Laterality Date   [place] Left 09/2014   Damage from remote that hit her eye   EYE SURGERY Right 06/2015   cyst removal from medial canthus   RUPTURED GLOBE EXPLORATION AND REPAIR Left 09/16/2014   Procedure:  Ruptured Globe Repair Left Eye;  Surgeon: Lamonte Sakai, MD;  Location: [facility];  Service: Ophthalmology;  Laterality: Left;   TUBAL LIGATION     tubaligation  1994   Family History  Problem Relation Age of Onset   Lupus Mother    Heart disease Mother    Colon cancer Father 68   Thyroid disease Daughter    Bipolar disorder Daughter    Hypertension Other    Cancer Other    CAD Other   Review of Systems  Genitourinary:       Pelvic pain  All other systems reviewed and are negative.  Vitals:   07/18/20 1457  BP: 129/87  Pulse: 88  Temp: (!) 97.3 F (36.3 C)  TempSrc: Temporal  SpO2: 95%  Weight: 187 lb 9.6 oz (85.1 kg)  Height: 5\' 4"  (1.626 m)   General: Vital signs reviewed.  Patient is well-developed and well-nourished, obese female ,in no acute distress and cooperative with exam.  Head: Normocephalic and atraumatic. Eyes: EOMI, conjunctivae normal, no scleral icterus.  Neck: Supple, trachea midline, normal ROM, no JVD, masses, thyromegaly, or carotid bruit present.  Cardiovascular: RRR, S1 normal, S2 normal, no murmurs, gallops, or rubs. Pulmonary/Chest: Clear to auscultation bilaterally, no wheezes, rales, or rhonchi. Abdominal: Soft, non-tender, distended, BS +, no masses, organomegaly, or guarding present.  Musculoskeletal: No joint deformities, erythema, or stiffness, ROM full and nontender. Extremities: No lower extremity edema bilaterally,  pulses symmetric and intact bilaterally. No cyanosis or clubbing. Neurological: A&O x3, Strength is normal and symmetric bilaterally,  Skin: Warm, dry and intact. No rashes or erythema. Psychiatric: Normal mood and affect. speech and  behavior is normal. Cognition and memory are normal.   Assessment and plan  Routine general medical examination at a health care facility Completed   Alcoholism [facility]) Chronic problem   Mixed hyperlipidemia  Decrease your fatty foods, red meat, cheese, milk and increase fiber like whole grains and veggies. You can also add a fiber supplement like Metamucil or Benefiber.   Medication refill -     risperiDONE (RISPERDAL) 1 MG tablet; Take 1 tablet (1 mg total) by mouth at bedtime.  Elevated lipoprotein(a) Decrease your fatty foods, red meat, cheese, milk and increase fiber like whole grains and veggies.   -     atorvastatin (LIPITOR) 20 MG tablet; Take  2 tablets (40 mg total) by mouth daily. -     Lipid Panel  Screening for colon cancer -     Ambulatory referral to Gastroenterology  Pain in joint involving pelvic region and thigh, unspecified laterality -     Ambulatory referral to Gynecology  Current Outpatient Medications:    atorvastatin (LIPITOR) 20 MG tablet, TAKE 2 TABLETS (40 MG TOTAL) BY MOUTH DAILY., Disp: 60 tablet, Rfl: 5   risperiDONE (RISPERDAL) 1 MG tablet, Take 1 tablet (1 mg total) by mouth at bedtime., Disp: 30 tablet, Rfl: 0   loratadine (CLARITIN) 10 MG tablet, Take 1 tablet (10 mg total) by mouth daily. (Patient not taking: Reported on 07/18/2020), Disp: 30 tablet, Rfl: 11   Counseled on healthy lifestyle choices, including diet (rich in fruits, vegetables and lean meats and low in salt and simple carbohydrates) and exercise (at least 30 minutes of moderate physical activity daily).  Patient to follow up in 1 year for annual exam or sooner if needed.  The above assessment and management plan was discussed with the patient. The patient verbalized understanding of and has agreed to the management plan. Patient is aware to call the clinic if symptoms persist or worsen. Patient is aware when to return to the clinic for a follow-up visit. Patient educated on when it  is appropriate to go to the emergency department.   Juluis Mire NP-C [address] [place] [phone]

## 2020-07-19 LAB — LIPID PANEL
Chol/HDL Ratio: 1.8 ratio (ref 0.0–4.4)
Cholesterol, Total: 229 mg/dL — ABNORMAL HIGH (ref 100–199)
HDL: 126 mg/dL (ref >39)
LDL Chol Calc (NIH): 87 mg/dL (ref 0–99)
Triglycerides: 94 mg/dL (ref 0–149)
VLDL Cholesterol Cal: 16 mg/dL (ref 5–40)

## 2020-08-11 MED FILL — ATORVASTATIN CALCIUM 20 MG: 20 | 90 days supply | Qty: 180 | Fill #0

## 2020-08-11 MED FILL — risperiDONE 1 MG TABS: 1 | 90 days supply | Qty: 90 | Fill #0

## 2020-09-14 ENCOUNTER — Encounter: Payer: Self-pay | Admitting: Obstetrics & Gynecology

## 2020-09-14 ENCOUNTER — Other Ambulatory Visit: Payer: Self-pay

## 2020-09-14 ENCOUNTER — Ambulatory Visit (INDEPENDENT_AMBULATORY_CARE_PROVIDER_SITE_OTHER): Payer: Medicaid Other | Admitting: Obstetrics & Gynecology

## 2020-09-14 ENCOUNTER — Other Ambulatory Visit (HOSPITAL_COMMUNITY)
Admission: RE | Admit: 2020-09-14 | Discharge: 2020-09-14 | Disposition: A | Discharge status: Home / Self Care | Payer: Medicaid Other | Source: Ambulatory Visit | Attending: Obstetrics & Gynecology | Admitting: Obstetrics & Gynecology

## 2020-09-14 VITALS — BP 113/90 | HR 98 | Wt 186.1 lb

## 2020-09-14 DIAGNOSIS — R9389 Abnormal findings on diagnostic imaging of other specified body structures: Secondary | ICD-10-CM | POA: Diagnosis not present

## 2020-09-14 DIAGNOSIS — Z1231 Encounter for screening mammogram for malignant neoplasm of breast: Secondary | ICD-10-CM | POA: Diagnosis not present

## 2020-09-14 DIAGNOSIS — Z124 Encounter for screening for malignant neoplasm of cervix: Secondary | ICD-10-CM | POA: Insufficient documentation

## 2020-09-14 NOTE — Progress Notes (Signed)
GYNECOLOGY OFFICE VISIT NOTE  History:   Janet Ramos is a 59 y.o. [phone] here today for re-evaluation of thickened endometrial stripe. No bleeding.  History of postmenopausal bleeding and also had thickened endometrial stripe in 2019, had benign endometrial biopsy suggestive of endometrial polyp. No further procedures done.  This endometrial thickening was incidentally noted on recent CT scan, measured 11 mm and was heterogenous (see report below).   She denies any abnormal vaginal discharge, bleeding, pelvic pain or other concerns.    Past Medical History:  Diagnosis Date  . 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  STates this is when she started drinking heaviily  . Bipolar 1 disorder ([place])   . Seizures ([place])   . Trichimoniasis 2010    Past Surgical History:  Procedure Laterality Date  . CERVICAL BIOPSY    . CESAREAN SECTION  1990  . ENUCLEATION Left 09/2014   Damage from remote that hit her eye  . EYE SURGERY Right 06/2015   cyst removal from medial canthus  . RUPTURED GLOBE EXPLORATION AND REPAIR Left 09/16/2014   Procedure:  Ruptured Globe Repair Left Eye;  Surgeon: Lamonte Sakai, MD;  Location: [facility];  Service: Ophthalmology;  Laterality: Left;  . TUBAL LIGATION    . tubaligation  1994    The following portions of the patient's history were reviewed and updated as appropriate: allergies, current medications, past family history, past medical history, past social history, past surgical history and problem list.   Health Maintenance:  Normal pap and negative HRHPV on 12/23/2017.  Normal mammogram on 10/02/2018.   Review of Systems:  Pertinent items noted in HPI and remainder of comprehensive ROS otherwise negative.  Physical Exam:  BP 113/90   Pulse 98   Wt 186 lb 1.6 oz (84.4 kg)   LMP 03/15/2011   BMI 31.94 kg/m  CONSTITUTIONAL: Well-developed, well-nourished female in no acute distress.  HEENT:  Normocephalic,  atraumatic. External right and left ear normal. No scleral icterus.  NECK: Normal range of motion, supple, no masses noted on observation SKIN: No rash noted. Not diaphoretic. No erythema. No pallor. MUSCULOSKELETAL: Normal range of motion. No edema noted. NEUROLOGIC: Alert and oriented to person, place, and time. Normal muscle tone coordination. No cranial nerve deficit noted. PSYCHIATRIC: Normal mood and affect. Normal behavior. Normal judgment and thought content. CARDIOVASCULAR: Normal heart rate noted RESPIRATORY: Effort and breath sounds normal, no problems with respiration noted ABDOMEN: No masses noted. No other overt distention noted.   PELVIC: Normal appearing external genitalia with moderate atrophy; normal urethral meatus; atrophic appearing vaginal mucosa and cervix.  No abnormal discharge noted.  Pap smear done. Normal uterine size, no other palpable masses, no uterine or adnexal tenderness. Performed in the presence of a chaperone.  ENDOMETRIAL BIOPSY     The indications for endometrial biopsy were reviewed.   Risks of the biopsy including cramping, bleeding, infection, uterine perforation, inadequate specimen and need for additional procedures  were discussed. The patient states she understands and agrees to undergo procedure today. Consent was signed. Time out was performed.  During the pelvic exam, the cervix was prepped with Betadine. A single-toothed tenaculum was placed on the anterior lip of the cervix to stabilize it. The 3 mm pipelle was introduced into the endometrial cavity without difficulty to a depth of 8 cm, and a moderate amount of tissue was obtained after two passes and sent to pathology. The instruments  were removed from the patient's vagina. Minimal bleeding from the cervix was noted. The patient tolerated the procedure well. Routine post-procedure instructions were given to the patient.    Labs 02/09/2018 Endometrial Biopsy - BENIGN ENDOMETRIUM WITH FEATURES  SUGGESTIVE OF ENDOMETRIAL POLYP - NO HYPERPLASIA OR MALIGNANCY IDENTIFIED  05/03/2020 CT Abdomen Pelvis W Contrast  CLINICAL DATA:  Right lower quadrant abdominal pain EXAM: CT ABDOMEN AND PELVIS WITH CONTRAST TECHNIQUE: Multidetector CT imaging of the abdomen and pelvis was performed using the standard protocol following bolus administration of intravenous contrast. CONTRAST:  139mL OMNIPAQUE IOHEXOL 300 MG/ML  SOLN COMPARISON:  CT 04/20/2018, pelvic ultrasound 12/21/2018 FINDINGS: Lower chest: Lung bases are clear. Normal heart size. No pericardial effusion. Few calcified right hilar nodes are present. May reflect sequela of prior granulomatous disease. Dependent atelectatic changes in the right lung base. Lung bases otherwise clear. Normal heart size. No pericardial effusion. Hepatobiliary: Diffuse hepatic hypoattenuation compatible with hepatic steatosis. Smooth liver surface contour. Gallbladder with a Phrygian cap morphology similar to prior. No pericholecystic fluid or inflammation. No biliary ductal dilatation. No visible calcified gallstones. Pancreas: Unremarkable. No pancreatic ductal dilatation or surrounding inflammatory changes. Spleen: Normal in size. No concerning splenic lesions. Adrenals/Urinary Tract: Normal adrenal glands. Kidneys enhance and excrete symmetrically without concerning renal lesion. No urolithiasis or hydronephrosis. Physiologic distension of the urinary bladder without acute concerning abnormality. Stomach/Bowel: Distal esophagus and stomach are unremarkable. Redemonstration of a large air and fluid-filled duodenal diverticulum measuring up to 3.2 cm in size (2/37). No surrounding inflammation. Duodenum otherwise unremarkable. No small bowel thickening or dilatation. A normal appendix is visualized in the right lower quadrant (2/60-68) without periappendiceal fluid or inflammation. No colonic dilatation or wall thickening. Scattered though predominantly distal, colonic  diverticula without focal inflammation to suggest diverticulitis. Vascular/Lymphatic: No significant vascular findings are present. No enlarged abdominal or pelvic lymph nodes. Reproductive: Anteverted uterus. Redemonstration of a prominent and slightly heterogeneous endometrial stripe measuring up to 11 mm in maximal thickness on today's examination. No concerning adnexal lesion.  Other: No abdominopelvic free fluid or free gas. No bowel containing hernias. Small bilateral fat containing inguinal hernias. Musculoskeletal: Multilevel degenerative changes are present in the imaged portions of the spine. Most pronounced at L4-5 with unchanged grade 1 anterolisthesis, disc uncovering and a calcified global disc bulge resulting in moderate canal stenosis and moderate to severe bilateral foraminal narrowing, left greater than right. Additional degenerative changes in the hips and pelvis including stable sclerotic cystic changes at the pubic symphysis which could reflect sequela of prior osteitis pubis. No acute osseous abnormality or suspicious osseous lesion. IMPRESSION: 1. No acute intra-abdominal process to provide a cause for patient's right lower quadrant pain. Normal appendix. 2. Hepatic steatosis. 3. Duodenal and colonic diverticulosis without evidence of diverticulitis. 4. Redemonstration of a thickened, heterogeneous endometrial stripe. Evaluation on prior sonography revealed underlying cystic endometrial hyperplasia with consideration of potential endometrial biopsy, correlate with results if performed. 5. Multilevel degenerative changes of the lumbar spine, Most pronounced at L4-5 with moderate canal stenosis and moderate to severe bilateral foraminal narrowing, left greater than right. Electronically Signed   By: Lovena Le M.D.   On: 05/03/2020 22:55      Assessment and Plan:      1. Thickened endometrium Endometrial biopsy done, will follow up results and manage accordingly. - Surgical  pathology  2. Breast cancer screening by mammogram Mammogram scheduled - MM 3D SCREEN BREAST BILATERAL; Future  3. Pap smear for cervical cancer screening - Cytology -  PAP done, .will follow up results and manage accordingly. Routine preventative health maintenance measures emphasized. Please refer to After Visit Summary for other counseling recommendations.   Return for any gynecologic concerns.    I spent 30 minutes dedicated to the care of this patient including pre-visit review of records, face to face time with the patient discussing her conditions and treatments and post visit ordering of testing.    Verita Schneiders, MD, [place] for [employer], [place]

## 2020-09-14 NOTE — Patient Instructions (Signed)

## 2020-09-18 ENCOUNTER — Telehealth: Payer: Self-pay

## 2020-09-18 LAB — CYTOLOGY - PAP
Comment: NEGATIVE
Diagnosis: NEGATIVE
High risk HPV: NEGATIVE

## 2020-09-18 LAB — SURGICAL PATHOLOGY

## 2020-09-18 NOTE — Telephone Encounter (Signed)
-----   Message from Osborne Oman, MD sent at 09/18/2020 10:39 AM EST ----- Benign endometrial biopsy. Please call to inform patient of results.

## 2020-09-18 NOTE — Telephone Encounter (Signed)
Called pt to go over Biopsy results using phone# on file [phone], line was busy/cont. Busy signal. No way to leave a message, will try later.

## 2020-09-26 ENCOUNTER — Telehealth: Payer: Self-pay | Admitting: Lactation Services

## 2020-09-26 NOTE — Telephone Encounter (Signed)
Patient called in and asked for results of her procedure on 12/02.   Returned patients call and phone was busy. Will need to try again later.

## 2020-09-27 NOTE — Telephone Encounter (Signed)
Called patient and her [phone] number was busy. Called the alternate number on the list and call could not be completed at this time. Will try once more and then send a letter to patient.

## 2020-09-28 NOTE — Telephone Encounter (Signed)
Attempted to call patient, home phone number was busy. Called 2nd home phone and no answer. Letter created to be mailed to patient today.

## 2020-10-14 HISTORY — PX: CERVICAL BIOPSY: SHX590

## 2020-10-25 ENCOUNTER — Ambulatory Visit: Payer: Medicaid Other

## 2020-11-27 MED FILL — risperiDONE 1 MG TABS: 1 | 90 days supply | Qty: 90 | Fill #1

## 2020-11-27 MED FILL — ATORVASTATIN CALCIUM 20 MG: 20 | 90 days supply | Qty: 180 | Fill #2

## 2021-01-13 ENCOUNTER — Other Ambulatory Visit: Payer: Self-pay

## 2021-01-15 ENCOUNTER — Encounter: Payer: Self-pay | Admitting: General Practice

## 2021-02-21 ENCOUNTER — Other Ambulatory Visit (INDEPENDENT_AMBULATORY_CARE_PROVIDER_SITE_OTHER): Payer: Self-pay | Admitting: Primary Care

## 2021-02-21 ENCOUNTER — Other Ambulatory Visit: Payer: Self-pay

## 2021-02-21 DIAGNOSIS — Z760 Encounter for issue of repeat prescription: Secondary | ICD-10-CM

## 2021-02-21 DIAGNOSIS — E7841 Elevated Lipoprotein(a): Secondary | ICD-10-CM

## 2021-02-21 NOTE — Telephone Encounter (Signed)
Requested medication (s) are due for refill today: no  Requested medication (s) are on the active medication list: yes  Last refill: 08/11/2020  Future visit scheduled: no  Notes to clinic: Patient due for follow up on 01/16/2021 Review for refill   Requested Prescriptions  Pending Prescriptions Disp Refills   atorvastatin (LIPITOR) 20 MG tablet 90 tablet 1    Sig: TAKE 2 TABLETS (40 MG TOTAL) BY MOUTH DAILY.      Cardiovascular:  Antilipid - Statins Failed - 02/21/2021  2:33 PM      Failed - Total Cholesterol in normal range and within 360 days    Cholesterol, Total  Date Value Ref Range Status  07/18/2020 229 (H) 100 - 199 mg/dL Final          Passed - LDL in normal range and within 360 days    LDL Chol Calc (NIH)  Date Value Ref Range Status  07/18/2020 87 0 - 99 mg/dL Final          Passed - HDL in normal range and within 360 days    HDL  Date Value Ref Range Status  07/18/2020 126 >39 mg/dL Final          Passed - Triglycerides in normal range and within 360 days    Triglycerides  Date Value Ref Range Status  07/18/2020 94 0 - 149 mg/dL Final          Passed - Patient is not pregnant      Passed - Valid encounter within last 12 months    Recent Outpatient Visits           7 months ago Routine general medical examination at a health care facility   [place], Michelle P, NP   1 year ago Choking due to phlegm in larynx, initial encounter   [place], Michelle P, NP   1 year ago Mixed hyperlipidemia   [place], [place], NP   1 year ago Encounter for screening for lung cancer   [place], Michelle P, NP   1 year ago Hospital discharge follow-up   [place], Michelle P, NP                  risperiDONE (RISPERDAL) 1 MG tablet 90 tablet 1    Sig: TAKE 1 TABLET (1 MG TOTAL) BY MOUTH  AT BEDTIME.      Not Delegated - Psychiatry:  Antipsychotics - Second Generation (Atypical) - risperidone Failed - 02/21/2021  2:33 PM      Failed - This refill cannot be delegated      Failed - Prolactin Level (serum) in normal range and within 180 days    No results found for: PROLACTIN, TOTPROLACTIN, LABPROL        Failed - ALT in normal range and within 180 days    ALT  Date Value Ref Range Status  05/03/2020 43 0 - 44 U/L Final          Failed - AST in normal range and within 180 days    AST  Date Value Ref Range Status  05/03/2020 82 (H) 15 - 41 U/L Final          Failed - Valid encounter within last 6 months    Recent Outpatient Visits           7 months ago  Routine general medical examination at a health care facility   [place], [place], NP   1 year ago Choking due to phlegm in larynx, initial encounter   [place], Michelle P, NP   1 year ago Mixed hyperlipidemia   [place] Kerin Perna, NP   1 year ago Encounter for screening for lung cancer   [place] Kerin Perna, NP   1 year ago Hospital discharge follow-up   [place] Kerin Perna, NP

## 2021-02-25 MED ORDER — RISPERIDONE 1 MG PO TABS
ORAL_TABLET | Freq: Every day | ORAL | 1 refills | Status: DC
  Filled 2021-02-25: qty 90, 90d supply, fill #0
  Filled 2021-02-26: qty 30, 30d supply, fill #0
  Filled 2021-02-26: qty 90, 90d supply, fill #0
  Filled 2021-06-04 – 2021-06-11 (×2): qty 90, 90d supply, fill #1

## 2021-02-25 MED ORDER — ATORVASTATIN CALCIUM 20 MG PO TABS
ORAL_TABLET | Freq: Every day | ORAL | 1 refills | Status: DC
  Filled 2021-02-25: qty 90, 45d supply, fill #0
  Filled 2021-06-04: qty 90, 45d supply, fill #1

## 2021-02-26 ENCOUNTER — Telehealth: Payer: Self-pay

## 2021-02-26 ENCOUNTER — Other Ambulatory Visit: Payer: Self-pay

## 2021-02-26 NOTE — Telephone Encounter (Signed)
Risperidone PA approved until 02/26/2022

## 2021-02-27 ENCOUNTER — Other Ambulatory Visit: Payer: Self-pay

## 2021-03-13 ENCOUNTER — Ambulatory Visit (INDEPENDENT_AMBULATORY_CARE_PROVIDER_SITE_OTHER): Payer: Medicaid Other | Admitting: Primary Care

## 2021-03-13 ENCOUNTER — Other Ambulatory Visit: Payer: Self-pay

## 2021-03-13 ENCOUNTER — Encounter (INDEPENDENT_AMBULATORY_CARE_PROVIDER_SITE_OTHER): Payer: Self-pay | Admitting: Primary Care

## 2021-03-13 VITALS — BP 125/95 | HR 72 | Temp 94.5°F | Ht 64.0 in | Wt 174.8 lb

## 2021-03-13 DIAGNOSIS — J301 Allergic rhinitis due to pollen: Secondary | ICD-10-CM

## 2021-03-13 DIAGNOSIS — R5383 Other fatigue: Secondary | ICD-10-CM

## 2021-03-13 DIAGNOSIS — K5900 Constipation, unspecified: Secondary | ICD-10-CM | POA: Diagnosis not present

## 2021-03-13 DIAGNOSIS — Z1211 Encounter for screening for malignant neoplasm of colon: Secondary | ICD-10-CM

## 2021-03-13 DIAGNOSIS — R093 Abnormal sputum: Secondary | ICD-10-CM | POA: Diagnosis not present

## 2021-03-13 DIAGNOSIS — E782 Mixed hyperlipidemia: Secondary | ICD-10-CM

## 2021-03-13 DIAGNOSIS — F17200 Nicotine dependence, unspecified, uncomplicated: Secondary | ICD-10-CM

## 2021-03-13 DIAGNOSIS — Z1231 Encounter for screening mammogram for malignant neoplasm of breast: Secondary | ICD-10-CM

## 2021-03-13 MED ORDER — LORATADINE 10 MG PO TABS
10.0000 mg | ORAL_TABLET | Freq: Every day | ORAL | 11 refills | Status: DC
  Filled 2021-03-13: qty 30, 30d supply, fill #0

## 2021-03-13 MED ORDER — SENNA 8.6 MG PO TABS
1.0000 | ORAL_TABLET | Freq: Every day | ORAL | 0 refills | Status: DC | PRN
  Filled 2021-03-13: qty 120, 120d supply, fill #0

## 2021-03-13 MED ORDER — DM-GUAIFENESIN ER 30-600 MG PO TB12
1.0000 | ORAL_TABLET | Freq: Two times a day (BID) | ORAL | 1 refills | Status: DC
  Filled 2021-03-13: qty 60, 30d supply, fill #0

## 2021-03-13 NOTE — Progress Notes (Signed)
Acute Office Visit  Subjective:    Patient ID: Janet Ramos, female    DOB: 1961/05/31, 60 y.o.   MRN: [id number]  Chief Complaint  Patient presents with  . Emesis    Due to excess phlegm. Complains of appetite loss     HPI [name]  Is a 60 year old female  in today for excessive sputum that is thick and white, feels as though she is choking and unable to get it all up. She has also loss her appetite and weight - recently tested for COVID and was negative. Difficulty breathing and shortness of breath when wearing mask.  Past Medical History:  Diagnosis Date  . 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  STates this is when she started drinking heaviily  . Bipolar 1 disorder ([place])   . Seizures ([place])   . Trichimoniasis 2010    Past Surgical History:  Procedure Laterality Date  . CERVICAL BIOPSY    . CESAREAN SECTION  1990  . ENUCLEATION Left 09/2014   Damage from remote that hit her eye  . EYE SURGERY Right 06/2015   cyst removal from medial canthus  . RUPTURED GLOBE EXPLORATION AND REPAIR Left 09/16/2014   Procedure:  Ruptured Globe Repair Left Eye;  Surgeon: Lamonte Sakai, MD;  Location: [facility];  Service: Ophthalmology;  Laterality: Left;  . TUBAL LIGATION    . tubaligation  1994    Family History  Problem Relation Age of Onset  . Lupus Mother   . Heart disease Mother   . Colon cancer Father 67  . Thyroid disease Daughter   . Bipolar disorder Daughter   . Hypertension Other   . Cancer Other   . CAD Other     Social History   Socioeconomic History  . Marital status: Single    Spouse name: Not on file  . Number of children: 6  . Years of education: Not on file  . Highest education level: Not on file  Occupational History  . Occupation: Nurses Aide in past.    Comment: unemployed since eye injury about 1 year ago.  Tobacco Use  . Smoking status: Current Every Day Smoker    Packs/day: 0.75     Years: 43.00    Pack years: 32.25    Start date: 02/24/1976  . Smokeless tobacco: Never Used  . Tobacco comment: Working on it.  Vaping Use  . Vaping Use: Never used  Substance and Sexual Activity  . Alcohol use: Yes    Alcohol/week: 8.0 standard drinks    Types: 8 Standard drinks or equivalent per week    Comment: daily use  . Drug use: Yes    Types: Marijuana  . Sexual activity: Not Currently    Birth control/protection: None  Other Topics Concern  . Not on file  Social History Narrative   Originally from [place], but moved to [place] at age 11 yo.     Lived with mother in [place].   Younger sister killed there--the man who was thought to be the killer was murdered in front of the patient's home by some of her sister's friends.   Older sister is felt to have been kidnapped by her [place] husband and take to [place]--occurred 5 years ago.   Middle sister infected with HIV from her husband.    Pt. Lives with a female platonic friend.    Social Determinants  of Health   Financial Resource Strain: Not on file  Food Insecurity: Not on file  Transportation Needs: Not on file  Physical Activity: Not on file  Stress: Not on file  Social Connections: Not on file  Intimate Partner Violence: Not on file    Outpatient Medications Prior to Visit  Medication Sig Dispense Refill  . atorvastatin (LIPITOR) 20 MG tablet TAKE 2 TABLETS (40 MG TOTAL) BY MOUTH DAILY. 90 tablet 1  . risperiDONE (RISPERDAL) 1 MG tablet TAKE 1 TABLET (1 MG TOTAL) BY MOUTH AT BEDTIME. 90 tablet 1  . loratadine (CLARITIN) 10 MG tablet Take 1 tablet (10 mg total) by mouth daily. (Patient not taking: No sig reported) 30 tablet 11   No facility-administered medications prior to visit.    No Known Allergies  Review of Systems  Constitutional: Positive for activity change, fatigue and unexpected weight change.  HENT: Positive for congestion.   Respiratory: Positive for shortness of breath.    Gastrointestinal: Positive for constipation.  All other systems reviewed and are negative.      Objective:    Physical Exam Vitals reviewed.  Constitutional:      Appearance: She is obese.  HENT:     Right Ear: External ear normal.     Left Ear: External ear normal.     Nose: Nose normal.  Cardiovascular:     Rate and Rhythm: Normal rate and regular rhythm.  Pulmonary:     Effort: Respiratory distress present.  Abdominal:     General: Abdomen is flat. Bowel sounds are normal.     Palpations: Abdomen is soft.  Musculoskeletal:     Cervical back: Normal range of motion and neck supple.     Right lower leg: Edema present.     Left lower leg: Edema present.     Comments: Decrease ROM  Skin:    General: Skin is warm and dry.  Neurological:     Mental Status: She is alert and oriented to person, place, and time.  Psychiatric:        Mood and Affect: Mood normal.        Behavior: Behavior normal.     BP (!) 125/95 (BP Location: Right Arm, Patient Position: Sitting, Cuff Size: Normal)   Pulse 72   Temp (!) 94.5 F (34.7 C) (Temporal)   Ht 5\' 4"  (1.626 m)   Wt 174 lb 12.8 oz (79.3 kg)   LMP 03/15/2011   SpO2 97%   BMI 30.00 kg/m  Wt Readings from Last 3 Encounters:  03/13/21 174 lb 12.8 oz (79.3 kg)  09/14/20 186 lb 1.6 oz (84.4 kg)  07/18/20 187 lb 9.6 oz (85.1 kg)    Health Maintenance Due  Topic Date Due  . Fecal DNA (Cologuard)  Never done  . Zoster Vaccines- Shingrix (1 of 2) Never done  . COVID-19 Vaccine (3 - Booster for Moderna series) 08/20/2020  . MAMMOGRAM  10/02/2020    There are no preventive care reminders to display for this patient.   Lab Results  Component Value Date   TSH 1.681 07/04/2019   Lab Results  Component Value Date   WBC 6.2 05/03/2020   HGB 15.3 (H) 05/03/2020   HCT 42.4 05/03/2020   MCV 91.6 05/03/2020   PLT 103 (L) 05/03/2020   Lab Results  Component Value Date   NA 135 05/03/2020   K 3.8 05/03/2020   CO2 27  05/03/2020   GLUCOSE 101 (H) 05/03/2020   BUN 10  05/03/2020   CREATININE 0.71 05/03/2020   BILITOT 1.2 05/03/2020   ALKPHOS 63 05/03/2020   AST 82 (H) 05/03/2020   ALT 43 05/03/2020   PROT 8.5 (H) 05/03/2020   ALBUMIN 4.3 05/03/2020   CALCIUM 9.9 05/03/2020   ANIONGAP 12 05/03/2020   Lab Results  Component Value Date   CHOL 229 (H) 07/18/2020   Lab Results  Component Value Date   HDL 126 07/18/2020   Lab Results  Component Value Date   LDLCALC 87 07/18/2020   Lab Results  Component Value Date   TRIG 94 07/18/2020   Lab Results  Component Value Date   CHOLHDL 1.8 07/18/2020   Lab Results  Component Value Date   HGBA1C 5.3 05/07/2015       Assessment & Plan:  Janet Ramos was seen today for emesis.  Diagnoses and all orders for this visit:  Thick sputum -     dextromethorphan-guaiFENesin (MUCINEX DM) 30-600 MG 12hr tablet; Take 1 tablet by mouth 2 (two) times daily.  Constipation, unspecified constipation type -     senna (SENOKOT) 8.6 MG TABS tablet; Take 1 tablet (8.6 mg total) by mouth daily as needed for mild constipation.  Non-seasonal allergic rhinitis due to pollen -     loratadine (CLARITIN) 10 MG tablet; Take 1 tablet (10 mg total) by mouth daily.  Screening for colon cancer -     Ambulatory referral to Gastroenterology  Encounter for screening mammogram for malignant neoplasm of breast -     MM Digital Screening; Future  Mixed hyperlipidemia Currently on atorvastatin 20 mg at bedtime.  She is monitoring her fat intake. Lipid Panel   Tobacco dependence Despite fatigue and shortness of breath she continues to smoke and is not interested in stopping at this time.  Each visit we will discuss cessation.  Fatigue, unspecified type We will check CBC and thyroid level.   No orders of the defined types were placed in this encounter.    Kerin Perna, NP

## 2021-03-13 NOTE — Patient Instructions (Signed)
Increase water, decrease diary products, use a nasal spray or nettie pot to open nasal congestion. Take guaifenesin as prescribed.

## 2021-03-14 ENCOUNTER — Other Ambulatory Visit: Payer: Self-pay

## 2021-03-15 ENCOUNTER — Other Ambulatory Visit (INDEPENDENT_AMBULATORY_CARE_PROVIDER_SITE_OTHER): Payer: Medicaid Other

## 2021-03-16 ENCOUNTER — Other Ambulatory Visit: Payer: Self-pay

## 2021-03-19 ENCOUNTER — Other Ambulatory Visit (INDEPENDENT_AMBULATORY_CARE_PROVIDER_SITE_OTHER): Payer: Medicaid Other

## 2021-03-19 ENCOUNTER — Other Ambulatory Visit: Payer: Self-pay

## 2021-03-19 DIAGNOSIS — E782 Mixed hyperlipidemia: Secondary | ICD-10-CM

## 2021-03-19 DIAGNOSIS — R5383 Other fatigue: Secondary | ICD-10-CM

## 2021-03-19 DIAGNOSIS — R093 Abnormal sputum: Secondary | ICD-10-CM

## 2021-03-20 LAB — LIPID PANEL
Chol/HDL Ratio: 1.9 ratio (ref 0.0–4.4)
Cholesterol, Total: 150 mg/dL (ref 100–199)
HDL: 79 mg/dL (ref >39)
LDL Chol Calc (NIH): 57 mg/dL (ref 0–99)
Triglycerides: 72 mg/dL (ref 0–149)
VLDL Cholesterol Cal: 14 mg/dL (ref 5–40)

## 2021-03-20 LAB — CBC WITH DIFFERENTIAL/PLATELET
Basophils Absolute: 0 10*3/uL (ref 0.0–0.2)
Basos: 1 %
EOS (ABSOLUTE): 0.1 10*3/uL (ref 0.0–0.4)
Eos: 1 %
Hematocrit: 39.3 % (ref 34.0–46.6)
Hemoglobin: 13.9 g/dL (ref 11.1–15.9)
Immature Grans (Abs): 0 10*3/uL (ref 0.0–0.1)
Immature Granulocytes: 0 %
Lymphocytes Absolute: 1.6 10*3/uL (ref 0.7–3.1)
Lymphs: 29 %
MCH: 34.1 pg — ABNORMAL HIGH (ref 26.6–33.0)
MCHC: 35.4 g/dL (ref 31.5–35.7)
MCV: 96 fL (ref 79–97)
Monocytes Absolute: 0.5 10*3/uL (ref 0.1–0.9)
Monocytes: 10 %
Neutrophils Absolute: 3.3 10*3/uL (ref 1.4–7.0)
Neutrophils: 59 %
Platelets: 57 10*3/uL — CL (ref 150–450)
RBC: 4.08 x10E6/uL (ref 3.77–5.28)
RDW: 14.9 % (ref 11.7–15.4)
WBC: 5.6 10*3/uL (ref 3.4–10.8)

## 2021-03-20 LAB — CMP14+EGFR
ALT: 111 IU/L — ABNORMAL HIGH (ref 0–32)
AST: 238 IU/L — ABNORMAL HIGH (ref 0–40)
Albumin/Globulin Ratio: 1 — ABNORMAL LOW (ref 1.2–2.2)
Albumin: 3.6 g/dL — ABNORMAL LOW (ref 3.8–4.9)
Alkaline Phosphatase: 113 IU/L (ref 44–121)
BUN/Creatinine Ratio: 5 — ABNORMAL LOW (ref 9–23)
BUN: 3 mg/dL — ABNORMAL LOW (ref 6–24)
Bilirubin Total: 0.9 mg/dL (ref 0.0–1.2)
CO2: 21 mmol/L (ref 20–29)
Calcium: 9 mg/dL (ref 8.7–10.2)
Chloride: 102 mmol/L (ref 96–106)
Creatinine, Ser: 0.56 mg/dL — ABNORMAL LOW (ref 0.57–1.00)
Globulin, Total: 3.6 g/dL (ref 1.5–4.5)
Glucose: 108 mg/dL — ABNORMAL HIGH (ref 65–99)
Potassium: 3.6 mmol/L (ref 3.5–5.2)
Sodium: 141 mmol/L (ref 134–144)
Total Protein: 7.2 g/dL (ref 6.0–8.5)
eGFR: 106 mL/min/{1.73_m2} (ref >59)

## 2021-03-20 LAB — TSH+FREE T4
Free T4: 1 ng/dL (ref 0.82–1.77)
TSH: 1.18 u[IU]/mL (ref 0.450–4.500)

## 2021-05-08 ENCOUNTER — Ambulatory Visit (INDEPENDENT_AMBULATORY_CARE_PROVIDER_SITE_OTHER): Payer: Self-pay | Admitting: *Deleted

## 2021-05-08 NOTE — Telephone Encounter (Signed)
Patient is calling to report she has had abdominal pain and vomiting for at least 1 week now. Patient states she is not having abdominal pain or vomiting today- but she has not eaten today. Patient reports she has lost weight due to her symptoms at at one point this week she considered going to ED- but did not go due to wait. Patient states it has been 1 week since she had alcohol(beer) and she is concerned about her liver as a reason for the vomiting. Patient states she vomits phlegm /congestion and she sees small blood clots at times. Patient advised no appointment at office within disposition- advised UC. Patient states she will go.

## 2021-05-08 NOTE — Telephone Encounter (Signed)
Reason for Disposition  [1] MILD or MODERATE vomiting AND [2] present > 48 hours (2 days) (Exception: mild vomiting with associated diarrhea)  Answer Assessment - Initial Assessment Questions 1. LOCATION: "Where does it hurt?"      Whole stomach 2. RADIATION: "Does the pain shoot anywhere else?" (e.g., chest, back)     no 3. ONSET: "When did the pain begin?" (e.g., minutes, hours or days ago)      3 days 4. SUDDEN: "Gradual or sudden onset?"     sudden 5. PATTERN "Does the pain come and go, or is it constant?"    - If constant: "Is it getting better, staying the same, or worsening?"      (Note: Constant means the pain never goes away completely; most serious pain is constant and it progresses)     - If intermittent: "How long does it last?" "Do you have pain now?"     (Note: Intermittent means the pain goes away completely between bouts)     No pain now- comes/goes 6. SEVERITY: "How bad is the pain?"  (e.g., Scale 1-10; mild, moderate, or severe)   - MILD (1-3): doesn't interfere with normal activities, abdomen soft and not tender to touch    - MODERATE (4-7): interferes with normal activities or awakens from sleep, abdomen tender to touch    - SEVERE (8-10): excruciating pain, doubled over, unable to do any normal activities      Not having pain now- 1 day ago 7. RECURRENT SYMPTOM: "Have you ever had this type of stomach pain before?" If Yes, ask: "When was the last time?" and "What happened that time?"      no 8. CAUSE: "What do you think is causing the stomach pain?"     no 9. RELIEVING/AGGRAVATING FACTORS: "What makes it better or worse?" (e.g., movement, antacids, bowel movement)     No- patient was considering going to ED 10. OTHER SYMPTOMS: "Do you have any other symptoms?" (e.g., back pain, diarrhea, fever, urination pain, vomiting)       vomiting 11. PREGNANCY: "Is there any chance you are pregnant?" "When was your last menstrual period?"       N/a  Answer Assessment -  Initial Assessment Questions 1. VOMITING SEVERITY: "How many times have you vomited in the past 24 hours?"     - MILD:  1 - 2 times/day    - MODERATE: 3 - 5 times/day, decreased oral intake without significant weight loss or symptoms of dehydration    - SEVERE: 6 or more times/day, vomits everything or nearly everything, with significant weight loss, symptoms of dehydration      No vomiting in last 2 days 2. ONSET: "When did the vomiting begin?"      1 week 3. FLUIDS: "What fluids or food have you vomited up today?" "Have you been able to keep any fluids down?"     No vomiting today- no appetite- not eaten today, patient is drinking 4. ABDOMINAL PAIN: "Are your having any abdominal pain?" If yes : "How bad is it and what does it feel like?" (e.g., crampy, dull, intermittent, constant)      Yes- not today 5. DIARRHEA: "Is there any diarrhea?" If Yes, ask: "How many times today?"      no 6. CONTACTS: "Is there anyone else in the family with the same symptoms?"      no 7. CAUSE: "What do you think is causing your vomiting?"     Not sure- no beer  in 1 week 8. HYDRATION STATUS: "Any signs of dehydration?" (e.g., dry mouth [not only dry lips], too weak to stand) "When did you last urinate?"     Dry mouth and weakness, patient is urinating- dark color 9. OTHER SYMPTOMS: "Do you have any other symptoms?" (e.g., fever, headache, vertigo, vomiting blood or coffee grounds, recent head injury)     Congestion/mucus- clots- small, not everytime 10. PREGNANCY: "Is there any chance you are pregnant?" "When was your last menstrual period?"       N/a  Protocols used: Abdominal Pain - Female-A-AH, Vomiting-A-AH

## 2021-05-14 DIAGNOSIS — Z8719 Personal history of other diseases of the digestive system: Secondary | ICD-10-CM

## 2021-05-14 HISTORY — DX: Personal history of other diseases of the digestive system: Z87.19

## 2021-05-16 ENCOUNTER — Telehealth: Payer: Self-pay

## 2021-05-16 NOTE — Telephone Encounter (Signed)
Pt would like the mobile mammo event on September 21st.

## 2021-06-04 ENCOUNTER — Other Ambulatory Visit: Payer: Self-pay

## 2021-06-06 ENCOUNTER — Encounter (HOSPITAL_COMMUNITY): Payer: Self-pay

## 2021-06-06 ENCOUNTER — Other Ambulatory Visit: Payer: Self-pay

## 2021-06-06 ENCOUNTER — Emergency Department (HOSPITAL_COMMUNITY): Payer: Medicaid Other

## 2021-06-06 ENCOUNTER — Inpatient Hospital Stay (HOSPITAL_COMMUNITY): Payer: Medicaid Other

## 2021-06-06 ENCOUNTER — Inpatient Hospital Stay (HOSPITAL_COMMUNITY)
Admission: EM | Admit: 2021-06-06 | Discharge: 2021-06-09 | DRG: 378 | Disposition: A | Discharge status: Home / Self Care | Payer: Medicaid Other | Attending: Internal Medicine | Admitting: Internal Medicine

## 2021-06-06 DIAGNOSIS — E7841 Elevated Lipoprotein(a): Secondary | ICD-10-CM

## 2021-06-06 DIAGNOSIS — G40909 Epilepsy, unspecified, not intractable, without status epilepticus: Secondary | ICD-10-CM | POA: Diagnosis present

## 2021-06-06 DIAGNOSIS — K2921 Alcoholic gastritis with bleeding: Secondary | ICD-10-CM | POA: Diagnosis present

## 2021-06-06 DIAGNOSIS — T39315A Adverse effect of propionic acid derivatives, initial encounter: Secondary | ICD-10-CM | POA: Diagnosis present

## 2021-06-06 DIAGNOSIS — K852 Alcohol induced acute pancreatitis without necrosis or infection: Secondary | ICD-10-CM

## 2021-06-06 DIAGNOSIS — F10239 Alcohol dependence with withdrawal, unspecified: Secondary | ICD-10-CM | POA: Diagnosis present

## 2021-06-06 DIAGNOSIS — K3189 Other diseases of stomach and duodenum: Secondary | ICD-10-CM | POA: Diagnosis present

## 2021-06-06 DIAGNOSIS — Z8249 Family history of ischemic heart disease and other diseases of the circulatory system: Secondary | ICD-10-CM | POA: Diagnosis not present

## 2021-06-06 DIAGNOSIS — R112 Nausea with vomiting, unspecified: Secondary | ICD-10-CM

## 2021-06-06 DIAGNOSIS — Z20822 Contact with and (suspected) exposure to covid-19: Secondary | ICD-10-CM | POA: Diagnosis present

## 2021-06-06 DIAGNOSIS — F319 Bipolar disorder, unspecified: Secondary | ICD-10-CM | POA: Diagnosis present

## 2021-06-06 DIAGNOSIS — Z832 Family history of diseases of the blood and blood-forming organs and certain disorders involving the immune mechanism: Secondary | ICD-10-CM

## 2021-06-06 DIAGNOSIS — F17210 Nicotine dependence, cigarettes, uncomplicated: Secondary | ICD-10-CM | POA: Diagnosis present

## 2021-06-06 DIAGNOSIS — R1010 Upper abdominal pain, unspecified: Secondary | ICD-10-CM | POA: Diagnosis not present

## 2021-06-06 DIAGNOSIS — R0602 Shortness of breath: Secondary | ICD-10-CM

## 2021-06-06 DIAGNOSIS — K760 Fatty (change of) liver, not elsewhere classified: Secondary | ICD-10-CM | POA: Diagnosis present

## 2021-06-06 DIAGNOSIS — K920 Hematemesis: Secondary | ICD-10-CM | POA: Diagnosis present

## 2021-06-06 DIAGNOSIS — F10939 Alcohol use, unspecified with withdrawal, unspecified: Secondary | ICD-10-CM | POA: Diagnosis present

## 2021-06-06 DIAGNOSIS — N898 Other specified noninflammatory disorders of vagina: Secondary | ICD-10-CM | POA: Diagnosis present

## 2021-06-06 DIAGNOSIS — Z6827 Body mass index (BMI) 27.0-27.9, adult: Secondary | ICD-10-CM | POA: Diagnosis not present

## 2021-06-06 DIAGNOSIS — K5904 Chronic idiopathic constipation: Secondary | ICD-10-CM | POA: Diagnosis present

## 2021-06-06 DIAGNOSIS — Z800 Family history of malignant neoplasm of digestive organs: Secondary | ICD-10-CM | POA: Diagnosis not present

## 2021-06-06 DIAGNOSIS — K766 Portal hypertension: Secondary | ICD-10-CM | POA: Diagnosis present

## 2021-06-06 DIAGNOSIS — K7010 Alcoholic hepatitis without ascites: Secondary | ICD-10-CM | POA: Diagnosis present

## 2021-06-06 DIAGNOSIS — J449 Chronic obstructive pulmonary disease, unspecified: Secondary | ICD-10-CM | POA: Diagnosis present

## 2021-06-06 DIAGNOSIS — Z720 Tobacco use: Secondary | ICD-10-CM | POA: Diagnosis present

## 2021-06-06 DIAGNOSIS — R109 Unspecified abdominal pain: Secondary | ICD-10-CM | POA: Diagnosis present

## 2021-06-06 DIAGNOSIS — E440 Moderate protein-calorie malnutrition: Secondary | ICD-10-CM | POA: Insufficient documentation

## 2021-06-06 DIAGNOSIS — R634 Abnormal weight loss: Secondary | ICD-10-CM | POA: Diagnosis present

## 2021-06-06 DIAGNOSIS — R093 Abnormal sputum: Secondary | ICD-10-CM

## 2021-06-06 DIAGNOSIS — R1013 Epigastric pain: Secondary | ICD-10-CM | POA: Diagnosis not present

## 2021-06-06 HISTORY — DX: Alcohol use, unspecified with withdrawal, unspecified: F10.939

## 2021-06-06 LAB — URINALYSIS, ROUTINE W REFLEX MICROSCOPIC
Bacteria, UA: NONE SEEN
Bilirubin Urine: NEGATIVE
Glucose, UA: NEGATIVE mg/dL
Ketones, ur: NEGATIVE mg/dL
Nitrite: NEGATIVE
Protein, ur: NEGATIVE mg/dL
Specific Gravity, Urine: 1.001 — ABNORMAL LOW (ref 1.005–1.030)
pH: 6 (ref 5.0–8.0)

## 2021-06-06 LAB — CBC WITH DIFFERENTIAL/PLATELET
Abs Immature Granulocytes: 0.03 10*3/uL (ref 0.00–0.07)
Basophils Absolute: 0 10*3/uL (ref 0.0–0.1)
Basophils Relative: 0 %
Eosinophils Absolute: 0.1 10*3/uL (ref 0.0–0.5)
Eosinophils Relative: 1 %
HCT: 35.1 % — ABNORMAL LOW (ref 36.0–46.0)
Hemoglobin: 12.7 g/dL (ref 12.0–15.0)
Immature Granulocytes: 0 %
Lymphocytes Relative: 31 %
Lymphs Abs: 2 10*3/uL (ref 0.7–4.0)
MCH: 34.7 pg — ABNORMAL HIGH (ref 26.0–34.0)
MCHC: 36.2 g/dL — ABNORMAL HIGH (ref 30.0–36.0)
MCV: 95.9 fL (ref 80.0–100.0)
Monocytes Absolute: 0.6 10*3/uL (ref 0.1–1.0)
Monocytes Relative: 10 %
Neutro Abs: 3.9 10*3/uL (ref 1.7–7.7)
Neutrophils Relative %: 58 %
Platelets: 90 10*3/uL — ABNORMAL LOW (ref 150–400)
RBC: 3.66 MIL/uL — ABNORMAL LOW (ref 3.87–5.11)
RDW: 15.8 % — ABNORMAL HIGH (ref 11.5–15.5)
WBC: 6.7 10*3/uL (ref 4.0–10.5)
nRBC: 0.6 % — ABNORMAL HIGH (ref 0.0–0.2)

## 2021-06-06 LAB — COMPREHENSIVE METABOLIC PANEL
ALT: 104 U/L — ABNORMAL HIGH (ref 0–44)
AST: 318 U/L — ABNORMAL HIGH (ref 15–41)
Albumin: 3.1 g/dL — ABNORMAL LOW (ref 3.5–5.0)
Alkaline Phosphatase: 165 U/L — ABNORMAL HIGH (ref 38–126)
Anion gap: 12 (ref 5–15)
BUN: <5 mg/dL — ABNORMAL LOW (ref 6–20)
CO2: 22 mmol/L (ref 22–32)
Calcium: 9 mg/dL (ref 8.9–10.3)
Chloride: 102 mmol/L (ref 98–111)
Creatinine, Ser: 0.42 mg/dL — ABNORMAL LOW (ref 0.44–1.00)
GFR, Estimated: >60 mL/min (ref >60)
Glucose, Bld: 105 mg/dL — ABNORMAL HIGH (ref 70–99)
Potassium: 3.6 mmol/L (ref 3.5–5.1)
Sodium: 136 mmol/L (ref 135–145)
Total Bilirubin: 2 mg/dL — ABNORMAL HIGH (ref 0.3–1.2)
Total Protein: 7.5 g/dL (ref 6.5–8.1)

## 2021-06-06 LAB — HEMOGLOBIN A1C
Hgb A1c MFr Bld: 5.3 % (ref 4.8–5.6)
Mean Plasma Glucose: 105.41 mg/dL

## 2021-06-06 LAB — PHOSPHORUS: Phosphorus: 2.9 mg/dL (ref 2.5–4.6)

## 2021-06-06 LAB — LIPASE, BLOOD: Lipase: 197 U/L — ABNORMAL HIGH (ref 11–51)

## 2021-06-06 LAB — HEPATITIS PANEL, ACUTE
HCV Ab: NONREACTIVE
Hep A IgM: NONREACTIVE
Hep B C IgM: NONREACTIVE
Hepatitis B Surface Ag: NONREACTIVE

## 2021-06-06 LAB — TSH: TSH: 3.002 u[IU]/mL (ref 0.350–4.500)

## 2021-06-06 LAB — MAGNESIUM: Magnesium: 1.8 mg/dL (ref 1.7–2.4)

## 2021-06-06 MED ORDER — IOHEXOL 350 MG/ML SOLN
100.0000 mL | Freq: Once | INTRAVENOUS | Status: AC | PRN
  Administered 2021-06-06: 80 mL via INTRAVENOUS

## 2021-06-06 MED ORDER — THIAMINE HCL 100 MG/ML IJ SOLN
100.0000 mg | Freq: Every day | INTRAMUSCULAR | Status: DC
  Administered 2021-06-06: 100 mg via INTRAVENOUS
  Filled 2021-06-06: qty 2

## 2021-06-06 MED ORDER — SODIUM CHLORIDE 0.9 % IV SOLN
INTRAVENOUS | Status: DC | PRN
Start: 2021-06-06 — End: 2021-06-09

## 2021-06-06 MED ORDER — ONDANSETRON HCL 4 MG/2ML IJ SOLN
4.0000 mg | Freq: Once | INTRAMUSCULAR | Status: AC
  Administered 2021-06-06: 4 mg via INTRAVENOUS
  Filled 2021-06-06: qty 2

## 2021-06-06 MED ORDER — SODIUM CHLORIDE 0.9 % IV SOLN: INTRAVENOUS | Status: DC

## 2021-06-06 MED ORDER — FOLIC ACID 1 MG PO TABS
1.0000 mg | ORAL_TABLET | Freq: Every day | ORAL | Status: DC
  Administered 2021-06-08 – 2021-06-09 (×2): 1 mg via ORAL
  Filled 2021-06-06 (×3): qty 1

## 2021-06-06 MED ORDER — MORPHINE SULFATE (PF) 4 MG/ML IV SOLN
4.0000 mg | Freq: Once | INTRAVENOUS | Status: AC
  Administered 2021-06-06: 4 mg via INTRAVENOUS
  Filled 2021-06-06: qty 1

## 2021-06-06 MED ORDER — ALUM & MAG HYDROXIDE-SIMETH 200-200-20 MG/5ML PO SUSP
30.0000 mL | Freq: Once | ORAL | Status: AC
  Administered 2021-06-06: 30 mL via ORAL
  Filled 2021-06-06: qty 30

## 2021-06-06 MED ORDER — HYDROCODONE-ACETAMINOPHEN 5-325 MG PO TABS
1.0000 | ORAL_TABLET | ORAL | Status: DC | PRN
  Administered 2021-06-06 – 2021-06-07 (×3): 1 via ORAL
  Filled 2021-06-06 (×3): qty 1

## 2021-06-06 MED ORDER — ACETAMINOPHEN 325 MG PO TABS: 650.0000 mg | ORAL_TABLET | Freq: Four times a day (QID) | ORAL | Status: DC | PRN

## 2021-06-06 MED ORDER — RISPERIDONE 0.25 MG PO TABS
0.2500 mg | ORAL_TABLET | Freq: Every day | ORAL | Status: DC
  Administered 2021-06-06 – 2021-06-07 (×2): 0.25 mg via ORAL
  Filled 2021-06-06 (×2): qty 1

## 2021-06-06 MED ORDER — MORPHINE SULFATE (PF) 2 MG/ML IV SOLN
2.0000 mg | INTRAVENOUS | Status: DC | PRN
  Administered 2021-06-06: 2 mg via INTRAVENOUS
  Filled 2021-06-06: qty 1

## 2021-06-06 MED ORDER — LIDOCAINE VISCOUS HCL 2 % MT SOLN
15.0000 mL | Freq: Once | OROMUCOSAL | Status: AC
  Administered 2021-06-06: 15 mL via ORAL
  Filled 2021-06-06: qty 15

## 2021-06-06 MED ORDER — ONDANSETRON HCL 4 MG PO TABS: 4.0000 mg | ORAL_TABLET | Freq: Four times a day (QID) | ORAL | Status: DC | PRN

## 2021-06-06 MED ORDER — PANTOPRAZOLE SODIUM 40 MG IV SOLR
40.0000 mg | Freq: Two times a day (BID) | INTRAVENOUS | Status: DC
  Administered 2021-06-06 – 2021-06-09 (×7): 40 mg via INTRAVENOUS
  Filled 2021-06-06 (×7): qty 40

## 2021-06-06 MED ORDER — MEDROXYPROGESTERONE ACETATE 10 MG PO TABS: 10.0000 mg | ORAL_TABLET | Freq: Every day | ORAL | Status: DC

## 2021-06-06 MED ORDER — ACETAMINOPHEN 650 MG RE SUPP: 650.0000 mg | Freq: Four times a day (QID) | RECTAL | Status: DC | PRN

## 2021-06-06 MED ORDER — ADULT MULTIVITAMIN W/MINERALS CH
1.0000 | ORAL_TABLET | Freq: Every day | ORAL | Status: DC
  Administered 2021-06-08 – 2021-06-09 (×2): 1 via ORAL
  Filled 2021-06-06 (×3): qty 1

## 2021-06-06 MED ORDER — LORAZEPAM 1 MG PO TABS
1.0000 mg | ORAL_TABLET | ORAL | Status: AC | PRN
Start: 2021-06-06 — End: 2021-06-09

## 2021-06-06 MED ORDER — ONDANSETRON HCL 4 MG/2ML IJ SOLN
4.0000 mg | Freq: Four times a day (QID) | INTRAMUSCULAR | Status: DC | PRN
  Administered 2021-06-06 (×2): 4 mg via INTRAVENOUS
  Filled 2021-06-06 (×2): qty 2

## 2021-06-06 MED ORDER — ATORVASTATIN CALCIUM 10 MG PO TABS
20.0000 mg | ORAL_TABLET | Freq: Every day | ORAL | Status: DC
  Administered 2021-06-06 – 2021-06-09 (×3): 20 mg via ORAL
  Filled 2021-06-06 (×4): qty 2

## 2021-06-06 MED ORDER — LORAZEPAM 1 MG PO TABS: 0.0000 mg | ORAL_TABLET | Freq: Two times a day (BID) | ORAL | Status: DC

## 2021-06-06 MED ORDER — NICOTINE 14 MG/24HR TD PT24
14.0000 mg | MEDICATED_PATCH | Freq: Every day | TRANSDERMAL | Status: DC
  Administered 2021-06-06 – 2021-06-09 (×4): 14 mg via TRANSDERMAL
  Filled 2021-06-06 (×4): qty 1

## 2021-06-06 MED ORDER — LORAZEPAM 2 MG/ML IJ SOLN
1.0000 mg | INTRAMUSCULAR | Status: AC | PRN
  Administered 2021-06-06 – 2021-06-08 (×3): 1 mg via INTRAVENOUS
  Filled 2021-06-06 (×3): qty 1

## 2021-06-06 MED ORDER — THIAMINE HCL 100 MG PO TABS
100.0000 mg | ORAL_TABLET | Freq: Every day | ORAL | Status: DC
  Administered 2021-06-08 – 2021-06-09 (×2): 100 mg via ORAL
  Filled 2021-06-06 (×3): qty 1

## 2021-06-06 MED ORDER — LORAZEPAM 1 MG PO TABS
0.0000 mg | ORAL_TABLET | Freq: Four times a day (QID) | ORAL | Status: AC
  Administered 2021-06-06: 2 mg via ORAL
  Administered 2021-06-06 – 2021-06-07 (×2): 1 mg via ORAL
  Administered 2021-06-07 (×2): 2 mg via ORAL
  Filled 2021-06-06: qty 2
  Filled 2021-06-06 (×2): qty 1
  Filled 2021-06-06 (×3): qty 2

## 2021-06-06 MED ORDER — SODIUM CHLORIDE 0.9 % IV BOLUS
1000.0000 mL | Freq: Once | INTRAVENOUS | Status: AC
  Administered 2021-06-06: 1000 mL via INTRAVENOUS

## 2021-06-06 MED ORDER — HYDROMORPHONE HCL 1 MG/ML IJ SOLN
1.0000 mg | Freq: Once | INTRAMUSCULAR | Status: AC
  Administered 2021-06-06: 1 mg via INTRAVENOUS
  Filled 2021-06-06: qty 1

## 2021-06-06 MED ORDER — MEDROXYPROGESTERONE ACETATE 10 MG PO TABS
10.0000 mg | ORAL_TABLET | Freq: Every day | ORAL | Status: DC
  Administered 2021-06-06 – 2021-06-09 (×3): 10 mg via ORAL
  Filled 2021-06-06 (×4): qty 1

## 2021-06-06 NOTE — H&P (View-Only) (Signed)
UNASSIGNED PATIENT Reason for Consult: Abnormal weight loss with nausea and vomiting. Referring Physician: [facility]  Janet Ramos is an 60 y.o. female.  HPI: Janet Ramos is a 60 year old black female with multiple medical problems listed below who was admitted to the hospital with some nausea and vomiting.  She has a longstanding history of alcohol abuse and drinks about 6-12 beers per day and has had a 40 pound weight loss in the last few months.  She has never had a GI work-up in the past and denies having a previous EGD or colonoscopy.  She also smokes 1/2 pack of cigarettes per day for last 30 to 35 years.  She has had some shortness of breath periodically but denies having a cough or expectoration.  On admission she was noted to have a lipase of 197 AST of 318 ALT of 104 alkaline phosphatase of 165.  Hemoglobin is dropped slightly to 12 from 13.9 platelets are 90 K.  A CT scan of the abdomen pelvis showed no convincing evidence for pancreatitis but fatty liver with focal fatty sparing of the gallbladder fossa was noted along with a distended urinary bladder and diverticulosis in the large bowel with isolated diverticulum in the duodenum and without acute inflammation in the rest of the colon and appendix.  Patient claims she was given some water to drink but she vomited that up to.  She is quite restricted as she has not been able to eat or drink since she was admitted to the hospital..  Patient denies use of nonsteroidal drugs.  She has a history of chronic constipation and go over 7 to 10 days without having a BM. There is mention of hematemesis on the patient's admission H&P but she has had only couple of streaks of heme in her vomitus but denies frank hematemesis.  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 She started smoking about 45 years ago. She has a 32.25 pack-year smoking history. She has never used smokeless tobacco. She reports current alcohol use of about 8.0 standard drinks per week. She reports current drug use. Drug: Marijuana.  Allergies: No Known Allergies  Medications: I have reviewed the patient's current medications. Prior to Admission:  Medications Prior to Admission  Medication Sig Dispense Refill Last Dose   atorvastatin (LIPITOR) 20 MG  tablet TAKE 2 TABLETS (40 MG TOTAL) BY MOUTH DAILY. 90 tablet 1 Past Month   dextromethorphan-guaiFENesin (MUCINEX DM) 30-600 MG 12hr tablet Take 1 tablet by mouth 2 (two) times daily. (Patient taking differently: Take 1 tablet by mouth 2 (two) times daily as needed for cough.) 60 tablet 1 unk   ibuprofen (ADVIL) 200 MG tablet Take 400 mg by mouth every 6 (six) hours as needed.   Past Week   loratadine (CLARITIN) 10 MG tablet Take 1 tablet (10 mg total) by mouth daily. (Patient taking differently: Take 10 mg by mouth daily as needed for allergies.) 30 tablet 11 unk   Multiple Vitamins-Minerals (MULTI FOR HER 50+) TABS Take 1 tablet by mouth daily.   06/05/2021    risperiDONE (RISPERDAL) 1 MG tablet TAKE 1 TABLET (1 MG TOTAL) BY MOUTH AT BEDTIME. (Patient taking differently: Take 1 mg by mouth at bedtime.) 90 tablet 1 06/05/2021   senna (SENOKOT) 8.6 MG TABS tablet Take 1 tablet (8.6 mg total) by mouth daily as needed for mild constipation. 120 tablet 0 unk   Scheduled:  atorvastatin  20 mg Oral Daily   folic acid  1 mg Oral Daily   LORazepam  0-4 mg Oral Q6H   Followed by   Derrill Memo ON 06/08/2021] LORazepam  0-4 mg Oral Q12H   medroxyPROGESTERone  10 mg Oral Daily   multivitamin with minerals  1 tablet Oral Daily   nicotine  14 mg Transdermal Daily   pantoprazole (PROTONIX) IV  40 mg Intravenous Q12H   risperiDONE  0.25 mg Oral QHS   thiamine  100 mg Oral Daily   Or   thiamine  100 mg Intravenous Daily   Continuous:  sodium chloride      Results for orders placed or performed during the hospital encounter of 06/06/21 (from the past 48 hour(s))  CBC with Differential     Status: Abnormal   Collection Time: 06/06/21  2:56 AM  Result Value Ref Range   WBC 6.7 4.0 - 10.5 K/uL   RBC 3.66 (L) 3.87 - 5.11 MIL/uL   Hemoglobin 12.7 12.0 - 15.0 g/dL   HCT 35.1 (L) 36.0 - 46.0 %   MCV 95.9 80.0 - 100.0 fL   MCH 34.7 (H) 26.0 - 34.0 pg   MCHC 36.2 (H) 30.0 - 36.0 g/dL   RDW 15.8 (H) 11.5 - 15.5 %   Platelets 90 (L) 150 - 400 K/uL    Comment: SPECIMEN CHECKED FOR CLOTS Immature Platelet Fraction may be clinically indicated, consider ordering this additional test [id number] REPEATED TO VERIFY PLATELET COUNT CONFIRMED BY SMEAR    nRBC 0.6 (H) 0.0 - 0.2 %   Neutrophils Relative % 58 %   Neutro Abs 3.9 1.7 - 7.7 K/uL   Lymphocytes Relative 31 %   Lymphs Abs 2.0 0.7 - 4.0 K/uL   Monocytes Relative 10 %   Monocytes Absolute 0.6 0.1 - 1.0 K/uL   Eosinophils Relative 1 %   Eosinophils Absolute 0.1 0.0 - 0.5 K/uL   Basophils Relative 0 %   Basophils Absolute 0.0 0.0 - 0.1 K/uL   Immature Granulocytes 0 %   Abs Immature Granulocytes 0.03 0.00 - 0.07  K/uL    Comment: Performed at [facility], [place] [address] [place], [place] [address]  Comprehensive metabolic panel     Status: Abnormal   Collection Time: 06/06/21  2:56 AM  Result Value Ref Range   Sodium 136 135 - 145 mmol/L  Potassium 3.6 3.5 - 5.1 mmol/L   Chloride 102 98 - 111 mmol/L   CO2 22 22 - 32 mmol/L   Glucose, Bld 105 (H) 70 - 99 mg/dL    Comment: Glucose reference range applies only to samples taken after fasting for at least 8 hours.   BUN <5 (L) 6 - 20 mg/dL   Creatinine, Ser 0.42 (L) 0.44 - 1.00 mg/dL   Calcium 9.0 8.9 - 10.3 mg/dL   Total Protein 7.5 6.5 - 8.1 g/dL   Albumin 3.1 (L) 3.5 - 5.0 g/dL   AST 318 (H) 15 - 41 U/L   ALT 104 (H) 0 - 44 U/L   Alkaline Phosphatase 165 (H) 38 - 126 U/L   Total Bilirubin 2.0 (H) 0.3 - 1.2 mg/dL   GFR, Estimated >60 >60 mL/min    Comment: (NOTE) Calculated using the CKD-EPI Creatinine Equation (2021)    Anion gap 12 5 - 15    Comment: Performed at [facility], [place] [address] [place], [place] [address]  Lipase, blood     Status: Abnormal   Collection Time: 06/06/21  2:56 AM  Result Value Ref Range   Lipase 197 (H) 11 - 51 U/L    Comment: Performed at [facility], [place] [address] [place], [place] [address]  Magnesium     Status: None   Collection Time: 06/06/21  2:56 AM  Result Value Ref Range   Magnesium 1.8 1.7 - 2.4 mg/dL    Comment: Performed at [facility], [place] [address] [place], [place] [address]  Hemoglobin A1c     Status: None   Collection Time: 06/06/21  2:56 AM  Result Value Ref Range   Hgb A1c MFr Bld 5.3 4.8 - 5.6 %    Comment: (NOTE) Pre diabetes:          5.7%-6.4%  Diabetes:              >6.4%  Glycemic control for   <7.0% adults with diabetes    Mean Plasma Glucose 105.41 mg/dL    Comment: Performed at [place] [address] [place], [place] [address]  Phosphorus     Status: None   Collection Time:  06/06/21  2:56 AM  Result Value Ref Range   Phosphorus 2.9 2.5 - 4.6 mg/dL    Comment: Performed at [facility], [place] [address], [place], [place] [address]  Urinalysis, Routine w reflex microscopic Urine, Clean Catch     Status: Abnormal   Collection Time: 06/06/21  3:00 AM  Result Value Ref Range   Color, Urine YELLOW YELLOW   APPearance CLEAR CLEAR   Specific Gravity, Urine 1.001 (L) 1.005 - 1.030   pH 6.0 5.0 - 8.0   Glucose, UA NEGATIVE NEGATIVE mg/dL   Hgb urine dipstick LARGE (A) NEGATIVE   Bilirubin Urine NEGATIVE NEGATIVE   Ketones, ur NEGATIVE NEGATIVE mg/dL   Protein, ur NEGATIVE NEGATIVE mg/dL   Nitrite NEGATIVE NEGATIVE   Leukocytes,Ua MODERATE (A) NEGATIVE   RBC / HPF 0-5 0 - 5 RBC/hpf   WBC, UA 0-5 0 - 5 WBC/hpf   Bacteria, UA NONE SEEN NONE SEEN   Squamous Epithelial / LPF 0-5 0 - 5    Comment: Performed at [facility], [place] [address] [place], [place] [address]  Hepatitis panel, acute     Status: None   Collection Time: 06/06/21  5:24 AM  Result Value Ref Range  Hepatitis B Surface Ag NON REACTIVE NON REACTIVE   HCV Ab NON REACTIVE NON REACTIVE    Comment: (NOTE) Nonreactive HCV antibody screen is consistent with no HCV infections,  unless recent infection is suspected or other evidence exists to indicate HCV infection.     Hep A IgM NON REACTIVE NON REACTIVE   Hep B C IgM NON REACTIVE NON REACTIVE    Comment: Performed at [facility], [place] [address] [place], [place] [address]  TSH     Status: None   Collection Time: 06/06/21  8:21 AM  Result Value Ref Range   TSH 3.002 0.350 - 4.500 uIU/mL    Comment: Performed by a 3rd Generation assay with a functional sensitivity of <=0.01 uIU/mL. Performed at [facility], [place] [address] [place],  [address]     CT CHEST WO CONTRAST  Result Date: 06/06/2021 CLINICAL DATA:  60 year old female with history of respiratory failure. Shortness of  breath. EXAM: CT CHEST WITHOUT CONTRAST TECHNIQUE: Multidetector CT imaging of the chest was performed following the standard protocol without IV contrast. COMPARISON:  Chest CT 08/03/2019. FINDINGS: Cardiovascular: Heart size is normal. There is no significant pericardial fluid, thickening or pericardial calcification. There is aortic atherosclerosis, as well as atherosclerosis of the great vessels of the mediastinum and the coronary arteries, including calcified atherosclerotic plaque in the left main and left anterior descending coronary arteries. Mediastinum/Nodes: No pathologically enlarged mediastinal or hilar lymph nodes. Please note that accurate exclusion of hilar adenopathy is limited on noncontrast CT scans. Calcified subcarinal lymph nodes incidentally noted. Esophagus is unremarkable in appearance. Lungs/Pleura: No acute consolidative airspace disease. No pleural effusions. No suspicious appearing pulmonary nodules or masses are noted. Mild dependent subsegmental atelectasis or scarring noted in the lower lobes of the lungs bilaterally (right greater than left). Upper Abdomen: Diffuse low attenuation throughout the visualized hepatic parenchyma, indicative of very severe hepatic steatosis. Musculoskeletal: There are no aggressive appearing lytic or blastic lesions noted in the visualized portions of the skeleton. IMPRESSION: 1. No acute findings to account for the patient's symptoms. 2. Aortic atherosclerosis, in addition to left main and left anterior descending coronary artery disease. Please note that although the presence of coronary artery calcium documents the presence of coronary artery disease, the severity of this disease and any potential stenosis cannot be assessed on this non-gated CT examination. Assessment for potential risk factor modification, dietary therapy or pharmacologic therapy may be warranted, if clinically indicated. 3. Very severe hepatic steatosis. Aortic Atherosclerosis  (ICD10-I70.0). Electronically Signed   By: Vinnie Langton M.D.   On: 06/06/2021 08:21   CT ABDOMEN PELVIS W CONTRAST  Result Date: 06/06/2021 CLINICAL DATA:  60 year old female with abdominal and flank pain. Unintentional weight loss. Abnormal lipase. EXAM: CT ABDOMEN AND PELVIS WITH CONTRAST TECHNIQUE: Multidetector CT imaging of the abdomen and pelvis was performed using the standard protocol following bolus administration of intravenous contrast. CONTRAST:  13m OMNIPAQUE IOHEXOL 350 MG/ML SOLN COMPARISON:  CT Abdomen and Pelvis 05/03/2020 and earlier. FINDINGS: Lower chest: Stable mild elevation of the right hemidiaphragm. Cardiac size is at the upper limits of normal. No pericardial or pleural effusion. Hepatobiliary: Pronounced hepatic steatosis now. Probable fatty sparing near the gallbladder fossa on series 2, image 20. Contracted gallbladder. No bile duct enlargement. Pancreas: CT appearance of the pancreas is within normal limits. Spleen: Negative. Adrenals/Urinary Tract: Negative adrenal glands and kidneys. Symmetric renal enhancement and contrast excretion. Distended urinary bladder (470 mL), but otherwise unremarkable. Occasional  pelvic phleboliths. Stomach/Bowel: Mild to moderate diverticulosis of the large bowel from the splenic flexure to the proximal sigmoid. Occasional transverse colon diverticula. Mild diverticulosis of the ascending colon. No active inflammation. Normal appendix on series 2, image 62. Negative terminal ileum. No dilated small bowel. Decompressed stomach and duodenum. Duodenal diverticulum measuring 2.5 cm on series 2, image 29 is chronic with no active inflammation. No free air or free fluid. Vascular/Lymphatic: Major vascular structures in the abdomen and pelvis appear patent. Minimal aortic calcified atherosclerosis. No lymphadenopathy. Reproductive: Negative. Other: No pelvic free fluid. 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 Lower lumbar facet degeneration. Pubic symphysis degeneration. No acute osseous abnormality identified. IMPRESSION: 1. No convincing CT evidence of pancreatitis, but CT can be normal in mild acute pancreatitis. 2. Pronounced Fatty Liver Disease now. Suspect focal fatty sparing near the gallbladder fossa. 3. Distended urinary bladder (470 mL).  Query urinary retention. 4. Diverticulosis of the duodenum and large bowel without active inflammation. Normal appendix. Electronically Signed   By: Genevie Ann M.D.   On: 06/06/2021 06:26   DG CHEST PORT 1 VIEW  Result Date: 06/06/2021 CLINICAL DATA:  60 year old female with history of shortness of breath. EXAM: PORTABLE CHEST 1 VIEW COMPARISON:  Chest x-ray 03/11/2018. FINDINGS: Lung volumes are normal. No consolidative airspace disease. No pleural effusions. No pneumothorax. No pulmonary nodule or mass noted. Pulmonary vasculature and the cardiomediastinal silhouette are within normal limits. IMPRESSION: No radiographic evidence of acute cardiopulmonary disease. Electronically Signed   By: Vinnie Langton M.D.   On: 06/06/2021 08:18    Review of Systems  Constitutional:  Positive for activity change, fatigue and unexpected weight change. Negative for appetite change, chills, diaphoresis and fever.  Respiratory:  Positive for cough.   Cardiovascular: Negative.   Gastrointestinal:  Positive for abdominal pain, constipation, nausea and vomiting. Negative for blood in stool, diarrhea and rectal pain.  Genitourinary: Negative.   Musculoskeletal:  Positive for arthralgias.  Neurological:  Positive for dizziness, seizures and light-headedness.  Psychiatric/Behavioral:  Positive for agitation and dysphoric mood. The patient is nervous/anxious.   Blood pressure (!) 137/96, pulse 86, temperature 98.9 F (37.2 C), resp. rate 17, last menstrual period 03/15/2011, SpO2 96 %. Physical Exam Constitutional:      General: She is not in acute distress.     Appearance: She is well-developed.  Eyes:     Extraocular Movements: Extraocular movements intact.  Cardiovascular:     Rate and Rhythm: Normal rate and regular rhythm.  Pulmonary:     Effort: Pulmonary effort is normal.     Breath sounds: Normal breath sounds.  Abdominal:     General: Abdomen is protuberant. Bowel sounds are decreased.     Palpations: Abdomen is soft.     Tenderness: There is abdominal tenderness in the epigastric area.  Skin:    General: Skin is warm and dry.  Neurological:     General: No focal deficit present.     Mental Status: She is alert and oriented to person, place, and time.   Assessment/Plan: 1) Epigastric pain with nausea vomiting and abnormal weight loss of 40 pounds, in a 60 year old black female with alcohol abuse-plans are to do EGD and a colonoscopy.  Patient has been having nausea and vomiting some not sure if she can prep for colonoscopy therefore an EGD with me will be planned tomorrow to rule out esophagitis etc. and a colonoscopy that can be done at a later date 2) Fatty  liver secondary to alcohol abuse. 3) Chronic constipation. 4) Bipolar disorder. 5) History of seizure disorder Juanita Craver 06/06/2021, 2:40 PM

## 2021-06-06 NOTE — ED Notes (Signed)
Pt unable to tolerate PO meds. Pt vomited after drinking Mylanta and Xylocaine.

## 2021-06-06 NOTE — ED Notes (Signed)
Pt vomited again 20 minutes after PO medication given.

## 2021-06-06 NOTE — H&P (Signed)
History and Physical    Janet Ramos EHM:[id number] DOB: 11/09/1960 DOA: 06/06/2021  PCP: Kerin Perna, NP  Patient coming from: home  I have personally briefly reviewed patient's old medical records in [place]  Chief Complaint: abd. pain  HPI: Janet Ramos is a 60 y.o. female with medical history significant of alcohol abuse, seizures, bipolar presents with burning epigastric pain for the last month along with 40 pound weight loss.  Patient drinks 6-12 ounce beers daily.  Patient denies a history of GERD.  Patient also endorses some nausea.  Patient states she has some vomiting every day with some bright red blood.  Patient denies any melena or hematochezia.  Patient denies any history of prior GI bleeds and has never had an EGD or colonoscopy.  Of note patient smokes about half a pack a day and is smoked for over 30 years.  Patient endorses intermittent shortness of breath over the last month but no cough.  Patient denies any back pain but does endorse some sweats more so at night.  Patient denies a history of chronic pancreatitis.  Lipase today 197 AST 318 ALT 104.  Sodium potassium chloride bicarb within normal limits glucose 105 BUN 5 creatinine 0.4 calcium 9 gap of 12.  Alk phos elevated at 165.  White count 6.7 H&H 12 and 35 from prior 13.9 and 39.3.  Platelets are 90.  UA showing large blood no nitrites but moderate leuks.  Patient denies any dysuria.  Patient does endorse some vaginal itching but no discharge over the last 3 to 4 days.  No over-the-counter meds tried.  CT abdomen showed no convincing CT evidence of pancreatitis but CT can be normal in mild acute pancreatitis Per read, pronounced fatty liver disease now suspected focal fatty sparing near the gallbladder fossa distended urinary bladder and diverticulosis of duodenum and large bowel with without acute inflammation normal appendix.  Patient denies any issues urinating.  Patient was given Dilaudid morphine  and Zofran and normal saline in the ED without much improvement.  ED Course: As above  Review of Systems: As per HPI otherwise all other systems reviewed and are negative.  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  STates this is when she started drinking heaviily   Bipolar 1 disorder ([place])    [place]iz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 She started smoking about 45 years ago. She has a 32.25 pack-year smoking history. She has never used smokeless tobacco. She reports current alcohol use of about 8.0 standard drinks per week. She reports current drug use. Drug: Marijuana.  No Known Allergies  Family History  Problem Relation Age of Onset   Lupus Mother    Heart disease Mother    Colon cancer Father 55   Thyroid  disease Daughter    Bipolar disorder Daughter    Hypertension Other    Cancer Other    CAD Other      Prior to Admission medications   Medication Sig Start Date End Date Taking? Authorizing Provider  atorvastatin (LIPITOR) 20 MG tablet TAKE 2 TABLETS (40 MG TOTAL) BY MOUTH DAILY. 02/25/21 02/25/22 Yes Kerin Perna, NP  dextromethorphan-guaiFENesin (MUCINEX DM) 30-600 MG 12hr tablet Take 1 tablet by mouth 2 (two) times daily. Patient taking differently: Take 1 tablet by mouth 2 (two) times daily as needed for cough. 03/13/21  Yes Kerin Perna, NP  ibuprofen (ADVIL) 200  MG tablet Take 400 mg by mouth every 6 (six) hours as needed.   Yes [provider]  loratadine (CLARITIN) 10 MG tablet Take 1 tablet (10 mg total) by mouth daily. Patient taking differently: Take 10 mg by mouth daily as needed for allergies. 03/13/21  Yes Kerin Perna, NP  Multiple Vitamins-Minerals (MULTI FOR HER 50+) TABS Take 1 tablet by mouth daily.   Yes [provider]  risperiDONE (RISPERDAL) 1 MG tablet TAKE 1 TABLET (1 MG TOTAL) BY MOUTH AT BEDTIME. Patient taking differently: Take 1 mg by mouth at bedtime. 02/25/21 02/25/22 Yes Kerin Perna, NP  senna (SENOKOT) 8.6 MG TABS tablet Take 1 tablet (8.6 mg total) by mouth daily as needed for mild constipation. 03/13/21  Yes Kerin Perna, NP    Physical Exam: Vitals:   06/06/21 0430 06/06/21 0600 06/06/21 0700 06/06/21 0730  BP: (!) 132/108 101/77 105/67 103/80  Pulse: 70 78 71 96  Resp: 18 18 18 17   Temp:  98.9 F (37.2 C)    TempSrc:      SpO2: 98% 100% 94% 97%    Constitutional: NAD, calm, comfortable Vitals:   06/06/21 0430 06/06/21 0600 06/06/21 0700 06/06/21 0730  BP: (!) 132/108 101/77 105/67 103/80  Pulse: 70 78 71 96  Resp: 18 18 18 17   Temp:  98.9 F (37.2 C)    TempSrc:      SpO2: 98% 100% 94% 97%   Eyes: PERRL, lids and conjunctivae normal ENMT: Dry membranes are moist. Posterior pharynx clear of any exudate or lesions.Normal dentition.  Neck: normal, supple, no masses, no thyromegaly Respiratory: clear to auscultation bilaterally, no wheezing, no crackles. Normal respiratory effort. No accessory muscle use.  Cardiovascular: Regular rate and rhythm, no murmurs / rubs / gallops. No extremity edema. 2+ pedal pulses. No carotid bruits.  Abdomen: Hypoactive bowel sounds, epigastric tenderness with guarding, no rebound  musculoskeletal: no clubbing / cyanosis. No joint deformity upper and lower extremities. Good ROM, no contractures. Normal muscle tone.  Skin: no rashes, lesions,  ulcers. No induration Neurologic: CN 2-12 grossly intact. Sensation intact, GCS 15, Strength 5/5 in all 4.  Psychiatric: Normal judgment and insight. Alert and oriented x 3. Normal mood.   Labs on Admission: I have personally reviewed following labs and imaging studies  CBC: Recent Labs  Lab 06/06/21 0256  WBC 6.7  NEUTROABS 3.9  HGB 12.7  HCT 35.1*  MCV 95.9  PLT 90*    Basic Metabolic Panel: Recent Labs  Lab 06/06/21 0256  NA 136  K 3.6  CL 102  CO2 22  GLUCOSE 105*  BUN <5*  CREATININE 0.42*  CALCIUM 9.0    GFR: CrCl cannot be calculated (Unknown ideal weight.).  Liver Function Tests: Recent Labs  Lab 06/06/21 0256  AST 318*  ALT 104*  ALKPHOS 165*  BILITOT 2.0*  PROT 7.5  ALBUMIN 3.1*    Urine analysis:    Component Value Date/Time   COLORURINE YELLOW 06/06/2021 0300   APPEARANCEUR CLEAR 06/06/2021 0300   LABSPEC 1.001 (L) 06/06/2021 0300   PHURINE 6.0 06/06/2021 0300   GLUCOSEU NEGATIVE 06/06/2021 0300   HGBUR LARGE (A) 06/06/2021 0300   BILIRUBINUR NEGATIVE 06/06/2021 0300   BILIRUBINUR negative 01/02/2017 1513   KETONESUR NEGATIVE 06/06/2021 0300   PROTEINUR NEGATIVE 06/06/2021 0300   UROBILINOGEN 0.2 01/02/2017 1513   UROBILINOGEN 0.2 09/16/2014 1016   NITRITE NEGATIVE 06/06/2021 0300   LEUKOCYTESUR MODERATE (A) 06/06/2021 0300    Radiological Exams on Admission: CT ABDOMEN PELVIS W CONTRAST  Result Date: 06/06/2021 CLINICAL DATA:  60 year old female with abdominal and flank pain. Unintentional weight loss. Abnormal lipase. EXAM: CT ABDOMEN AND PELVIS WITH CONTRAST TECHNIQUE: Multidetector CT imaging of the abdomen and pelvis was performed using the standard protocol following bolus administration of intravenous contrast. CONTRAST:  62mL OMNIPAQUE IOHEXOL 350 MG/ML SOLN COMPARISON:  CT Abdomen and Pelvis 05/03/2020 and earlier. FINDINGS: Lower chest: Stable mild elevation of the right hemidiaphragm. Cardiac size is at the upper limits of  normal. No pericardial or pleural effusion. Hepatobiliary: Pronounced hepatic steatosis now. Probable fatty sparing near the gallbladder fossa on series 2, image 20. Contracted gallbladder. No bile duct enlargement. Pancreas: CT appearance of the pancreas is within normal limits. Spleen: Negative. Adrenals/Urinary Tract: Negative adrenal glands and kidneys. Symmetric renal enhancement and contrast excretion. Distended urinary bladder (470 mL), but otherwise unremarkable. Occasional pelvic phleboliths. Stomach/Bowel: Mild to moderate diverticulosis of the large bowel from the splenic flexure to the proximal sigmoid. Occasional transverse colon diverticula. Mild diverticulosis of the ascending colon. No active inflammation. Normal appendix on series 2, image 62. Negative terminal ileum. No dilated small bowel. Decompressed stomach and duodenum. Duodenal diverticulum measuring 2.5 cm on series 2, image 29 is chronic with no active inflammation. No free air or free fluid. Vascular/Lymphatic: Major vascular structures in the abdomen and pelvis appear patent. Minimal aortic calcified atherosclerosis. No lymphadenopathy. Reproductive: Negative. Other: No pelvic free fluid. 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 Lower lumbar facet degeneration. Pubic symphysis degeneration. No acute osseous abnormality identified. IMPRESSION: 1. No convincing CT evidence of pancreatitis, but CT can be normal in mild acute pancreatitis. 2. Pronounced Fatty Liver Disease now. Suspect focal fatty sparing near the gallbladder fossa. 3. Distended urinary bladder (470 mL).  Query urinary retention. 4. Diverticulosis of the duodenum and large bowel without active inflammation. Normal appendix. Electronically Signed   By: Genevie Ann M.D.   On: 06/06/2021 06:26      Assessment/Plan Active Problems:   Alcohol withdrawal syndrome with complication, with unspecified complication (HCC)   Abdominal  pain   Vaginal itching   Nausea and vomiting   Hematemesis   Tobacco abuse   Weight loss 60 year old female with history of alcohol abuse presents with abdominal pain nausea vomiting and weight loss.  Originally called for admission for pancreatitis but lipase less than 200 no significant change on CT. Abdominal pain-suspect gastritis due to alcohol abuse, starting PPI twice daily and given GI cocktail now Considering 40 pound weight loss and no prior EGD or colonoscopies will get GI evaluation  Hematemesis w/ n/v-likely secondary to presumed gastritis from alcohol abuse and NSAIDs-ibuprofen, hemoglobin 12 from 13, will continue to monitor give as needed Zofran for nausea or vomiting  Tobacco abuse-encourage cessation-given nicotine patch  Shortness of breath with weight  loss-with symptoms above along with 30+ pack year history, will get CT of chest without contrast Follow-up TSH  Vaginal itching-patient denies discharge or recent antibiotics-history of trichomoniasis, will get STI panel With also some intermittent vaginal bleeding over the last several days-OB/GYN consult Patient had thickened endometrium per note in December 2021 and was seen by Dr.Ugonna Anyonwu Patient discussed with Dr. Gertie Gowda and OB/GYN over the phone who recommends outpatient follow-up and will start the patient on Provera 10 mg for 30 days.  Patient to call the office where she was previously seen at [facility] on [facility] for follow-up appointment.  Alcohol abuse-seizure precaution, placed on CIWA protocol  Disposition-admit patient, telemetry, continue to monitor  DVT prophylaxis: scd Code Status:   full Family Communication:   na Disposition Plan:   Patient is from:  home  Anticipated DC to:  home  Anticipated DC date:  tbd  Anticipated DC barriers: tbd  Consults called:  GI-Dr. Collene Mares Admission status:  inpt  Severity of Illness: The appropriate patient status for this patient is INPATIENT. Inpatient  status is judged to be reasonable and necessary in order to provide the required intensity of service to ensure the patient's safety. The patient's presenting symptoms, physical exam findings, and initial radiographic and laboratory data in the context of their chronic comorbidities is felt to place them at high risk for further clinical deterioration. Furthermore, it is not anticipated that the patient will be medically stable for discharge from the hospital within 2 midnights of admission. The following factors support the patient status of inpatient.   " The patient's presenting symptoms include as above. " The worrisome physical exam findings include as above. " The initial radiographic and laboratory data are worrisome because of as above. " The chronic co-morbidities include as above.   * I certify that at the point of admission it is my clinical judgment that the patient will require inpatient hospital care spanning beyond 2 midnights from the point of admission due to high intensity of service, high risk for further deterioration and high frequency of surveillance required.Jacqlyn Krauss MD [facility] [phone] How to contact the [facility] Attending or Consulting provider [place] or covering provider during after hours [place], for this patient?   Check the care team in [facility] and look for a) attending/consulting [facility] provider listed and b) the [facility] team listed Log into [email or web address] and use [place]'s universal password to access. If you do not have the password, please contact the hospital operator. Locate the [facility] provider you are looking for under [facility] and page to a number that you can be directly reached. If you still have difficulty reaching the provider, please page the [facility] (Director on Call) for the Hospitalists listed on amion for assistance.  06/06/2021, 8:03 AM

## 2021-06-06 NOTE — ED Notes (Signed)
Seizure pads placed on stretcher

## 2021-06-06 NOTE — ED Triage Notes (Addendum)
Pt BIB EMS. Pt complains of abdominal pain and flank pain x 3 months off and on. Pt states that the pain is getting worse. Pt reports not being able to eat well x 3 weeks. Pt reports losing weight.

## 2021-06-06 NOTE — ED Provider Notes (Signed)
Patient is a 60 year old female whose care was transferred to me at shift change.  Patient has a history and exam concerning for alcoholic pancreatitis.  Will admit to the medicine team for further work-up.   Rayna Sexton, PA-C 06/06/21 0735    Hayden Rasmussen, MD 06/06/21 1754

## 2021-06-06 NOTE — ED Provider Notes (Signed)
[facility] DEPT Provider Note   CSN: [id number] Arrival date & time: 06/06/21  0245     History Chief Complaint  Patient presents with   Abdominal Pain   Flank Pain    Janet Ramos is a 60 y.o. female with history of alcohol use who presents with abdominal and flank pain x 3 months. Patient states her pain originally began in [place] and is now generalized and worsening. She has associated vomiting with intermittent bright red emesis, diarrhea, and chills. She reports not being able to eat well and 40lb weight loss in past month. States PCP has discussed with her to cut back on her drinking, patient continues to drink a 6 pack of beer per day. Last drink was this morning prior to arrival. Also reports some vaginal spotting and itching x 1 day. No chest pain, SOB.  The history is provided by the patient.  Abdominal Pain Associated symptoms: chills, constipation, diarrhea, fatigue, nausea, vaginal bleeding and vomiting   Associated symptoms: no chest pain, no dysuria and no shortness of breath   Flank Pain Associated symptoms include abdominal pain. Pertinent negatives include no chest pain and no shortness of breath.      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  STates this is when she started drinking heaviily   Bipolar 1 disorder ([place])    Seizures ([place])    Trichimoniasis 2010    Patient Active Problem List   Diagnosis Date Noted   Alcohol withdrawal syndrome with complication, with unspecified complication ([place]) 86/76/1948   Thickened endometrium 02/09/2018   Substance induced mood disorder ([place]) 10/31/2017   Left knee pain 08/28/2015   Headache 05/08/2015   Hyperlipidemia 05/08/2015   Hypokalemia 05/08/2015   Right sided weakness 05/07/2015   Syncope 05/07/2015   Polysubstance abuse ([place]) 05/07/2015   Rupture of globe 09/16/2014    Past Surgical  History:  Procedure Laterality Date   CERVICAL BIOPSY     CESAREAN SECTION  1990   ENUCLEATION Left 09/2014   Damage from remote that hit her eye   EYE SURGERY Right 06/2015   cyst removal from medial canthus   RUPTURED GLOBE EXPLORATION AND REPAIR Left 09/16/2014   Procedure:  Ruptured Globe Repair Left Eye;  Surgeon: Lamonte Sakai, MD;  Location: [facility];  Service: Ophthalmology;  Laterality: Left;   TUBAL LIGATION     tubaligation  1994     OB History     Gravida  6   Para  6   Term  6   Preterm      AB      Living  6      SAB      IAB      Ectopic      Multiple      Live Births  6           Family History  Problem Relation Age of Onset   Lupus Mother    Heart disease Mother    Colon cancer Father 65   Thyroid disease Daughter    Bipolar disorder Daughter    Hypertension Other    Cancer Other    CAD Other     Social History   Tobacco Use   Smoking status: Every Day    Packs/day: 0.75    Years: 43.00    Pack years: 32.25  Types: Cigarettes    Start date: 02/24/1976   Smokeless tobacco: Never   Tobacco comments:    Working on it.  Vaping Use   Vaping Use: Never used  Substance Use Topics   Alcohol use: Yes    Alcohol/week: 8.0 standard drinks    Types: 8 Standard drinks or equivalent per week    Comment: daily use   Drug use: Yes    Types: Marijuana    Home Medications Prior to Admission medications   Medication Sig Start Date End Date Taking? Authorizing Provider  atorvastatin (LIPITOR) 20 MG tablet TAKE 2 TABLETS (40 MG TOTAL) BY MOUTH DAILY. 02/25/21 02/25/22  Kerin Perna, NP  dextromethorphan-guaiFENesin (MUCINEX DM) 30-600 MG 12hr tablet Take 1 tablet by mouth 2 (two) times daily. 03/13/21   Kerin Perna, NP  loratadine (CLARITIN) 10 MG tablet Take 1 tablet (10 mg total) by mouth daily. 03/13/21   Kerin Perna, NP  risperiDONE (RISPERDAL) 1 MG tablet TAKE 1 TABLET (1 MG TOTAL) BY MOUTH AT BEDTIME. 02/25/21  02/25/22  Kerin Perna, NP  senna (SENOKOT) 8.6 MG TABS tablet Take 1 tablet (8.6 mg total) by mouth daily as needed for mild constipation. 03/13/21   Kerin Perna, NP    Allergies    Patient has no known allergies.  Review of Systems   Review of Systems  Constitutional:  Positive for appetite change, chills, fatigue and unexpected weight change.  Respiratory:  Negative for shortness of breath.   Cardiovascular:  Negative for chest pain.  Gastrointestinal:  Positive for abdominal pain, constipation, diarrhea, nausea and vomiting. Negative for blood in stool.  Genitourinary:  Positive for flank pain and vaginal bleeding. Negative for dysuria.       Vaginal spotting and itching x 1 day  All other systems reviewed and are negative.  Physical Exam Updated Vital Signs BP (!) 132/108   Pulse 70   Temp 99.1 F (37.3 C) (Oral)   Resp 18   LMP 03/15/2011   SpO2 98%   Physical Exam Vitals and nursing note reviewed.  Constitutional:      Appearance: Normal appearance.  HENT:     Head: Normocephalic and atraumatic.  Eyes:     Conjunctiva/sclera: Conjunctivae normal.  Cardiovascular:     Rate and Rhythm: Normal rate and regular rhythm.  Pulmonary:     Effort: Pulmonary effort is normal. No respiratory distress.     Breath sounds: Normal breath sounds.  Abdominal:     General: There is no distension.     Palpations: Abdomen is soft.     Tenderness: There is generalized abdominal tenderness. There is right CVA tenderness, left CVA tenderness and guarding.  Skin:    General: Skin is warm and dry.  Neurological:     General: No focal deficit present.     Mental Status: She is alert and oriented to person, place, and time.    ED Results / Procedures / Treatments   Labs (all labs ordered are listed, but only abnormal results are displayed) Labs Reviewed  URINALYSIS, ROUTINE W REFLEX MICROSCOPIC - Abnormal; Notable for the following components:      Result Value    Specific Gravity, Urine 1.001 (*)    Hgb urine dipstick LARGE (*)    Leukocytes,Ua MODERATE (*)    All other components within normal limits  CBC WITH DIFFERENTIAL/PLATELET - Abnormal; Notable for the following components:   RBC 3.66 (*)    HCT 35.1 (*)  MCH 34.7 (*)    MCHC 36.2 (*)    RDW 15.8 (*)    Platelets 90 (*)    nRBC 0.6 (*)    All other components within normal limits  COMPREHENSIVE METABOLIC PANEL - Abnormal; Notable for the following components:   Glucose, Bld 105 (*)    BUN <5 (*)    Creatinine, Ser 0.42 (*)    Albumin 3.1 (*)    AST 318 (*)    ALT 104 (*)    Alkaline Phosphatase 165 (*)    Total Bilirubin 2.0 (*)    All other components within normal limits  LIPASE, BLOOD - Abnormal; Notable for the following components:   Lipase 197 (*)    All other components within normal limits    EKG None  Radiology No results found.  Procedures Procedures   Medications Ordered in ED Medications  sodium chloride 0.9 % bolus 1,000 mL (1,000 mLs Intravenous New Bag/Given 06/06/21 0359)  morphine 4 MG/ML injection 4 mg (4 mg Intravenous Given 06/06/21 0400)  ondansetron (ZOFRAN) injection 4 mg (4 mg Intravenous Given 06/06/21 0400)    ED Course  I have reviewed the triage vital signs and the nursing notes.  Pertinent labs & imaging results that were available during my care of the patient were reviewed by me and considered in my medical decision making (see chart for details).    MDM Rules/Calculators/A&P                           Patient is 60 y/o female with history of alcohol use presents with 3 months of abdominal pain, flank pain, and vomiting. Intermittent bright red blood in emesis. Patient drinks a 6 pack of beer per day. Reports 40lb weight loss in 1 month. Patient is afebrile with tenderness to palpation of generalized abdomen with guarding. Differential to include pancreatitis, hepatitis, gastritis, colitis, malignancy.  CBC shows thrombocytopenia,  stable and consistent with stated alcohol abuse history. CMP shows AST 318 >> ALT 104 to suggest alcoholic hepatitis, values stable compared to prior labs. Alk phos 165, bilirubin 2.0, both elevated compared to prior. Lipase 197. Hepatitis panel processing.  CT abdomen pelvis showed no evidence of pancreatitis and pronounced fatty liver disease. Patient's pain is persistent despite morphine and dilaudid. Consult to hospitalist for admission for acute pancreatitis, likely alcohol related.  Patient care discussed and transferred to [facility], refer to his note for disposition.  Final Clinical Impression(s) / ED Diagnoses Final diagnoses:  None    Rx / DC Orders ED Discharge Orders     None        Kateri Plummer, PA-C 06/06/21 0653    Maudie Flakes, MD 06/06/21 [phone]

## 2021-06-06 NOTE — ED Notes (Signed)
Home PO meds held until patient able to tolerate.

## 2021-06-06 NOTE — Consult Note (Addendum)
UNASSIGNED PATIENT Reason for Consult: Abnormal weight loss with nausea and vomiting. Referring Physician: [facility] hospital[facility]  Janet Ramos is an 60 y.o. female.  HPI: [name] is a 60 year old black female with multiple medical problems listed below who was admitted to the hospital with some nausea and vomiting.  She has a longstanding history of alcohol abuse and drinks about 6-12 beers per day and has had a 40 pound weight loss in the last few months.  She has never had a GI work-up in the past and denies having a previous EGD or colonoscopy.  She also smokes 1/2 pack of cigarettes per day for last 30 to 35 years.  She has had some shortness of breath periodically but denies having a cough or expectoration.  On admission she was noted to have a lipase of 197 AST of 318 ALT of 104 alkaline phosphatase of 165.  Hemoglobin is dropped slightly to 12 from 13.9 platelets are 90 K.  A CT scan of the abdomen pelvis showed no convincing evidence for pancreatitis but fatty liver with focal fatty sparing of the gallbladder fossa was noted along with a distended urinary bladder and diverticulosis in the large bowel with isolated diverticulum in the duodenum and without acute inflammation in the rest of the colon and appendix.  Patient claims she was given some water to drink but she vomited that up to.  She is quite restricted as she has not been able to eat or drink since she was admitted to the hospital..  Patient denies use of nonsteroidal drugs.  She has a history of chronic constipation and go over 7 to 10 days without having a BM. There is mention of hematemesis on the patient's admission H&P but she has had only couple of streaks of heme in her vomitus but denies frank hematemesis.  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 She started smoking about 45 years ago. She has a 32.25 pack-year smoking history. She has never used smokeless tobacco. She reports current alcohol use of about 8.0 standard drinks per week. She reports current drug use. Drug: Marijuana.  Allergies: No Known Allergies  Medications: I have reviewed the patient's current medications. Prior to Admission:  Medications Prior to Admission  Medication Sig Dispense Refill Last Dose   atorvastatin (LIPITOR) 20 MG  tablet TAKE 2 TABLETS (40 MG TOTAL) BY MOUTH DAILY. 90 tablet 1 Past Month   dextromethorphan-guaiFENesin (MUCINEX DM) 30-600 MG 12hr tablet Take 1 tablet by mouth 2 (two) times daily. (Patient taking differently: Take 1 tablet by mouth 2 (two) times daily as needed for cough.) 60 tablet 1 unk   ibuprofen (ADVIL) 200 MG tablet Take 400 mg by mouth every 6 (six) hours as needed.   Past Week   loratadine (CLARITIN) 10 MG tablet Take 1 tablet (10 mg total) by mouth daily. (Patient taking differently: Take 10 mg by mouth daily as needed for allergies.) 30 tablet 11 unk   Multiple Vitamins-Minerals (MULTI FOR HER 50+) TABS Take 1 tablet by mouth daily.   06/05/2021    risperiDONE (RISPERDAL) 1 MG tablet TAKE 1 TABLET (1 MG TOTAL) BY MOUTH AT BEDTIME. (Patient taking differently: Take 1 mg by mouth at bedtime.) 90 tablet 1 06/05/2021   senna (SENOKOT) 8.6 MG TABS tablet Take 1 tablet (8.6 mg total) by mouth daily as needed for mild constipation. 120 tablet 0 unk   Scheduled:  atorvastatin  20 mg Oral Daily   folic acid  1 mg Oral Daily   LORazepam  0-4 mg Oral Q6H   Followed by   Derrill Memo ON 06/08/2021] LORazepam  0-4 mg Oral Q12H   medroxyPROGESTERone  10 mg Oral Daily   multivitamin with minerals  1 tablet Oral Daily   nicotine  14 mg Transdermal Daily   pantoprazole (PROTONIX) IV  40 mg Intravenous Q12H   risperiDONE  0.25 mg Oral QHS   thiamine  100 mg Oral Daily   Or   thiamine  100 mg Intravenous Daily   Continuous:  sodium chloride      Results for orders placed or performed during the hospital encounter of 06/06/21 (from the past 48 hour(s))  CBC with Differential     Status: Abnormal   Collection Time: 06/06/21  2:56 AM  Result Value Ref Range   WBC 6.7 4.0 - 10.5 K/uL   RBC 3.66 (L) 3.87 - 5.11 MIL/uL   Hemoglobin 12.7 12.0 - 15.0 g/dL   HCT 35.1 (L) 36.0 - 46.0 %   MCV 95.9 80.0 - 100.0 fL   MCH 34.7 (H) 26.0 - 34.0 pg   MCHC 36.2 (H) 30.0 - 36.0 g/dL   RDW 15.8 (H) 11.5 - 15.5 %   Platelets 90 (L) 150 - 400 K/uL    Comment: SPECIMEN CHECKED FOR CLOTS Immature Platelet Fraction may be clinically indicated, consider ordering this additional test [id number] REPEATED TO VERIFY PLATELET COUNT CONFIRMED BY SMEAR    nRBC 0.6 (H) 0.0 - 0.2 %   Neutrophils Relative % 58 %   Neutro Abs 3.9 1.7 - 7.7 K/uL   Lymphocytes Relative 31 %   Lymphs Abs 2.0 0.7 - 4.0 K/uL   Monocytes Relative 10 %   Monocytes Absolute 0.6 0.1 - 1.0 K/uL   Eosinophils Relative 1 %   Eosinophils Absolute 0.1 0.0 - 0.5 K/uL   Basophils Relative 0 %   Basophils Absolute 0.0 0.0 - 0.1 K/uL   Immature Granulocytes 0 %   Abs Immature Granulocytes 0.03 0.00 - 0.07  K/uL    Comment: Performed at [facility], [place] [address] [place], [place] [address]  Comprehensive metabolic panel     Status: Abnormal   Collection Time: 06/06/21  2:56 AM  Result Value Ref Range   Sodium 136 135 - 145 mmol/L  Potassium 3.6 3.5 - 5.1 mmol/L   Chloride 102 98 - 111 mmol/L   CO2 22 22 - 32 mmol/L   Glucose, Bld 105 (H) 70 - 99 mg/dL    Comment: Glucose reference range applies only to samples taken after fasting for at least 8 hours.   BUN <5 (L) 6 - 20 mg/dL   Creatinine, Ser 0.42 (L) 0.44 - 1.00 mg/dL   Calcium 9.0 8.9 - 10.3 mg/dL   Total Protein 7.5 6.5 - 8.1 g/dL   Albumin 3.1 (L) 3.5 - 5.0 g/dL   AST 318 (H) 15 - 41 U/L   ALT 104 (H) 0 - 44 U/L   Alkaline Phosphatase 165 (H) 38 - 126 U/L   Total Bilirubin 2.0 (H) 0.3 - 1.2 mg/dL   GFR, Estimated >60 >60 mL/min    Comment: (NOTE) Calculated using the CKD-EPI Creatinine Equation (2021)    Anion gap 12 5 - 15    Comment: Performed at [facility], [place] [address] [place], [place] [address]  Lipase, blood     Status: Abnormal   Collection Time: 06/06/21  2:56 AM  Result Value Ref Range   Lipase 197 (H) 11 - 51 U/L    Comment: Performed at [facility], [place] [address], [place], [place] [address]  Magnesium     Status: None   Collection Time: 06/06/21  2:56 AM  Result Value Ref Range   Magnesium 1.8 1.7 - 2.4 mg/dL    Comment: Performed at [facility], [place] [address] [place], [place] [address]  Hemoglobin A1c     Status: None   Collection Time: 06/06/21  2:56 AM  Result Value Ref Range   Hgb A1c MFr Bld 5.3 4.8 - 5.6 %    Comment: (NOTE) Pre diabetes:          5.7%-6.4%  Diabetes:              >6.4%  Glycemic control for   <7.0% adults with diabetes    Mean Plasma Glucose 105.41 mg/dL    Comment: Performed at [facility] [address] [place], [place] [address]  Phosphorus     Status: None   Collection Time:  06/06/21  2:56 AM  Result Value Ref Range   Phosphorus 2.9 2.5 - 4.6 mg/dL    Comment: Performed at [facility], [place] [address], [place], [place] [address]  Urinalysis, Routine w reflex microscopic Urine, Clean Catch     Status: Abnormal   Collection Time: 06/06/21  3:00 AM  Result Value Ref Range   Color, Urine YELLOW YELLOW   APPearance CLEAR CLEAR   Specific Gravity, Urine 1.001 (L) 1.005 - 1.030   pH 6.0 5.0 - 8.0   Glucose, UA NEGATIVE NEGATIVE mg/dL   Hgb urine dipstick LARGE (A) NEGATIVE   Bilirubin Urine NEGATIVE NEGATIVE   Ketones, ur NEGATIVE NEGATIVE mg/dL   Protein, ur NEGATIVE NEGATIVE mg/dL   Nitrite NEGATIVE NEGATIVE   Leukocytes,Ua MODERATE (A) NEGATIVE   RBC / HPF 0-5 0 - 5 RBC/hpf   WBC, UA 0-5 0 - 5 WBC/hpf   Bacteria, UA NONE SEEN NONE SEEN   Squamous Epithelial / LPF 0-5 0 - 5    Comment: Performed at [facility], [place] [address] [place], [place] [address]  Hepatitis panel, acute     Status: None   Collection Time: 06/06/21  5:24 AM  Result Value Ref Range  Hepatitis B Surface Ag NON REACTIVE NON REACTIVE   HCV Ab NON REACTIVE NON REACTIVE    Comment: (NOTE) Nonreactive HCV antibody screen is consistent with no HCV infections,  unless recent infection is suspected or other evidence exists to indicate HCV infection.     Hep A IgM NON REACTIVE NON REACTIVE   Hep B C IgM NON REACTIVE NON REACTIVE    Comment: Performed at [facility], [place] [address] [place], [place] [address]  TSH     Status: None   Collection Time: 06/06/21  8:21 AM  Result Value Ref Range   TSH 3.002 0.350 - 4.500 uIU/mL    Comment: Performed by a 3rd Generation assay with a functional sensitivity of <=0.01 uIU/mL. Performed at [facility], [place] [address], [place], [place] [address]     CT CHEST WO CONTRAST  Result Date: 06/06/2021 CLINICAL DATA:  60 year old female with history of respiratory failure. Shortness of  breath. EXAM: CT CHEST WITHOUT CONTRAST TECHNIQUE: Multidetector CT imaging of the chest was performed following the standard protocol without IV contrast. COMPARISON:  Chest CT 08/03/2019. FINDINGS: Cardiovascular: Heart size is normal. There is no significant pericardial fluid, thickening or pericardial calcification. There is aortic atherosclerosis, as well as atherosclerosis of the great vessels of the mediastinum and the coronary arteries, including calcified atherosclerotic plaque in the left main and left anterior descending coronary arteries. Mediastinum/Nodes: No pathologically enlarged mediastinal or hilar lymph nodes. Please note that accurate exclusion of hilar adenopathy is limited on noncontrast CT scans. Calcified subcarinal lymph nodes incidentally noted. Esophagus is unremarkable in appearance. Lungs/Pleura: No acute consolidative airspace disease. No pleural effusions. No suspicious appearing pulmonary nodules or masses are noted. Mild dependent subsegmental atelectasis or scarring noted in the lower lobes of the lungs bilaterally (right greater than left). Upper Abdomen: Diffuse low attenuation throughout the visualized hepatic parenchyma, indicative of very severe hepatic steatosis. Musculoskeletal: There are no aggressive appearing lytic or blastic lesions noted in the visualized portions of the skeleton. IMPRESSION: 1. No acute findings to account for the patient's symptoms. 2. Aortic atherosclerosis, in addition to left main and left anterior descending coronary artery disease. Please note that although the presence of coronary artery calcium documents the presence of coronary artery disease, the severity of this disease and any potential stenosis cannot be assessed on this non-gated CT examination. Assessment for potential risk factor modification, dietary therapy or pharmacologic therapy may be warranted, if clinically indicated. 3. Very severe hepatic steatosis. Aortic Atherosclerosis  (ICD10-I70.0). Electronically Signed   By: Vinnie Langton M.D.   On: 06/06/2021 08:21   CT ABDOMEN PELVIS W CONTRAST  Result Date: 06/06/2021 CLINICAL DATA:  59 year old female with abdominal and flank pain. Unintentional weight loss. Abnormal lipase. EXAM: CT ABDOMEN AND PELVIS WITH CONTRAST TECHNIQUE: Multidetector CT imaging of the abdomen and pelvis was performed using the standard protocol following bolus administration of intravenous contrast. CONTRAST:  72m OMNIPAQUE IOHEXOL 350 MG/ML SOLN COMPARISON:  CT Abdomen and Pelvis 05/03/2020 and earlier. FINDINGS: Lower chest: Stable mild elevation of the right hemidiaphragm. Cardiac size is at the upper limits of normal. No pericardial or pleural effusion. Hepatobiliary: Pronounced hepatic steatosis now. Probable fatty sparing near the gallbladder fossa on series 2, image 20. Contracted gallbladder. No bile duct enlargement. Pancreas: CT appearance of the pancreas is within normal limits. Spleen: Negative. Adrenals/Urinary Tract: Negative adrenal glands and kidneys. Symmetric renal enhancement and contrast excretion. Distended urinary bladder (470 mL), but otherwise unremarkable. Occasional  pelvic phleboliths. Stomach/Bowel: Mild to moderate diverticulosis of the large bowel from the splenic flexure to the proximal sigmoid. Occasional transverse colon diverticula. Mild diverticulosis of the ascending colon. No active inflammation. Normal appendix on series 2, image 62. Negative terminal ileum. No dilated small bowel. Decompressed stomach and duodenum. Duodenal diverticulum measuring 2.5 cm on series 2, image 29 is chronic with no active inflammation. No free air or free fluid. Vascular/Lymphatic: Major vascular structures in the abdomen and pelvis appear patent. Minimal aortic calcified atherosclerosis. No lymphadenopathy. Reproductive: Negative. Other: No pelvic free fluid. 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 Lower lumbar facet degeneration. Pubic symphysis degeneration. No acute osseous abnormality identified. IMPRESSION: 1. No convincing CT evidence of pancreatitis, but CT can be normal in mild acute pancreatitis. 2. Pronounced Fatty Liver Disease now. Suspect focal fatty sparing near the gallbladder fossa. 3. Distended urinary bladder (470 mL).  Query urinary retention. 4. Diverticulosis of the duodenum and large bowel without active inflammation. Normal appendix. Electronically Signed   By: Genevie Ann M.D.   On: 06/06/2021 06:26   DG CHEST PORT 1 VIEW  Result Date: 06/06/2021 CLINICAL DATA:  60 year old female with history of shortness of breath. EXAM: PORTABLE CHEST 1 VIEW COMPARISON:  Chest x-ray 03/11/2018. FINDINGS: Lung volumes are normal. No consolidative airspace disease. No pleural effusions. No pneumothorax. No pulmonary nodule or mass noted. Pulmonary vasculature and the cardiomediastinal silhouette are within normal limits. IMPRESSION: No radiographic evidence of acute cardiopulmonary disease. Electronically Signed   By: Vinnie Langton M.D.   On: 06/06/2021 08:18    Review of Systems  Constitutional:  Positive for activity change, fatigue and unexpected weight change. Negative for appetite change, chills, diaphoresis and fever.  Respiratory:  Positive for cough.   Cardiovascular: Negative.   Gastrointestinal:  Positive for abdominal pain, constipation, nausea and vomiting. Negative for blood in stool, diarrhea and rectal pain.  Genitourinary: Negative.   Musculoskeletal:  Positive for arthralgias.  Neurological:  Positive for dizziness, seizures and light-headedness.  Psychiatric/Behavioral:  Positive for agitation and dysphoric mood. The patient is nervous/anxious.   Blood pressure (!) 137/96, pulse 86, temperature 98.9 F (37.2 C), resp. rate 17, last menstrual period 03/15/2011, SpO2 96 %. Physical Exam Constitutional:      General: She is not in acute distress.     Appearance: She is well-developed.  Eyes:     Extraocular Movements: Extraocular movements intact.  Cardiovascular:     Rate and Rhythm: Normal rate and regular rhythm.  Pulmonary:     Effort: Pulmonary effort is normal.     Breath sounds: Normal breath sounds.  Abdominal:     General: Abdomen is protuberant. Bowel sounds are decreased.     Palpations: Abdomen is soft.     Tenderness: There is abdominal tenderness in the epigastric area.  Skin:    General: Skin is warm and dry.  Neurological:     General: No focal deficit present.     Mental Status: She is alert and oriented to person, place, and time.   Assessment/Plan: 1) Epigastric pain with nausea vomiting and abnormal weight loss of 40 pounds, in a 60 year old black female with alcohol abuse-plans are to do EGD and a colonoscopy.  Patient has been having nausea and vomiting some not sure if she can prep for colonoscopy therefore an EGD with me will be planned tomorrow to rule out esophagitis etc. and a colonoscopy that can be done at a later date 2) Fatty  liver secondary to alcohol abuse. 3) Chronic constipation. 4) Bipolar disorder. 5) History of seizure disorder Juanita Craver 06/06/2021, 2:40 PM

## 2021-06-07 ENCOUNTER — Encounter (HOSPITAL_COMMUNITY)
Admission: EM | Disposition: A | Discharge status: Home / Self Care | Payer: Self-pay | Source: Home / Self Care | Attending: Internal Medicine

## 2021-06-07 ENCOUNTER — Encounter (HOSPITAL_COMMUNITY): Payer: Self-pay | Admitting: Family Medicine

## 2021-06-07 ENCOUNTER — Inpatient Hospital Stay (HOSPITAL_COMMUNITY): Payer: Medicaid Other | Admitting: Certified Registered Nurse Anesthetist

## 2021-06-07 DIAGNOSIS — K852 Alcohol induced acute pancreatitis without necrosis or infection: Secondary | ICD-10-CM

## 2021-06-07 DIAGNOSIS — E440 Moderate protein-calorie malnutrition: Secondary | ICD-10-CM | POA: Insufficient documentation

## 2021-06-07 DIAGNOSIS — K920 Hematemesis: Secondary | ICD-10-CM

## 2021-06-07 DIAGNOSIS — F10239 Alcohol dependence with withdrawal, unspecified: Secondary | ICD-10-CM

## 2021-06-07 DIAGNOSIS — R1013 Epigastric pain: Secondary | ICD-10-CM | POA: Diagnosis not present

## 2021-06-07 HISTORY — PX: ESOPHAGOGASTRODUODENOSCOPY: SHX5428

## 2021-06-07 LAB — PROTIME-INR
INR: 1.2 (ref 0.8–1.2)
Prothrombin Time: 15.1 seconds (ref 11.4–15.2)

## 2021-06-07 LAB — COMPREHENSIVE METABOLIC PANEL
ALT: 76 U/L — ABNORMAL HIGH (ref 0–44)
AST: 165 U/L — ABNORMAL HIGH (ref 15–41)
Albumin: 2.6 g/dL — ABNORMAL LOW (ref 3.5–5.0)
Alkaline Phosphatase: 142 U/L — ABNORMAL HIGH (ref 38–126)
Anion gap: 7 (ref 5–15)
BUN: <5 mg/dL — ABNORMAL LOW (ref 6–20)
CO2: 24 mmol/L (ref 22–32)
Calcium: 8.5 mg/dL — ABNORMAL LOW (ref 8.9–10.3)
Chloride: 106 mmol/L (ref 98–111)
Creatinine, Ser: 0.47 mg/dL (ref 0.44–1.00)
GFR, Estimated: >60 mL/min (ref >60)
Glucose, Bld: 89 mg/dL (ref 70–99)
Potassium: 3.4 mmol/L — ABNORMAL LOW (ref 3.5–5.1)
Sodium: 137 mmol/L (ref 135–145)
Total Bilirubin: 1.9 mg/dL — ABNORMAL HIGH (ref 0.3–1.2)
Total Protein: 6.5 g/dL (ref 6.5–8.1)

## 2021-06-07 LAB — CBC
HCT: 32.8 % — ABNORMAL LOW (ref 36.0–46.0)
Hemoglobin: 11.4 g/dL — ABNORMAL LOW (ref 12.0–15.0)
MCH: 34.5 pg — ABNORMAL HIGH (ref 26.0–34.0)
MCHC: 34.8 g/dL (ref 30.0–36.0)
MCV: 99.4 fL (ref 80.0–100.0)
Platelets: 72 10*3/uL — ABNORMAL LOW (ref 150–400)
RBC: 3.3 MIL/uL — ABNORMAL LOW (ref 3.87–5.11)
RDW: 16.7 % — ABNORMAL HIGH (ref 11.5–15.5)
WBC: 5.2 10*3/uL (ref 4.0–10.5)
nRBC: 0.8 % — ABNORMAL HIGH (ref 0.0–0.2)

## 2021-06-07 LAB — APTT: aPTT: 29 seconds (ref 24–36)

## 2021-06-07 LAB — HIV ANTIBODY (ROUTINE TESTING W REFLEX): HIV Screen 4th Generation wRfx: NONREACTIVE

## 2021-06-07 LAB — MAGNESIUM: Magnesium: 1.9 mg/dL (ref 1.7–2.4)

## 2021-06-07 LAB — SARS CORONAVIRUS 2 (TAT 6-24 HRS): SARS Coronavirus 2: NEGATIVE

## 2021-06-07 LAB — LIPASE, BLOOD: Lipase: 107 U/L — ABNORMAL HIGH (ref 11–51)

## 2021-06-07 SURGERY — EGD (ESOPHAGOGASTRODUODENOSCOPY)
Anesthesia: Monitor Anesthesia Care

## 2021-06-07 MED ORDER — LACTATED RINGERS IV SOLN: Freq: Once | INTRAVENOUS | Status: AC

## 2021-06-07 MED ORDER — PROPOFOL 10 MG/ML IV BOLUS
INTRAVENOUS | Status: DC | PRN
  Administered 2021-06-07: 50 mg via INTRAVENOUS
  Administered 2021-06-07: 30 mg via INTRAVENOUS

## 2021-06-07 MED ORDER — ENSURE ENLIVE PO LIQD
237.0000 mL | Freq: Two times a day (BID) | ORAL | Status: DC
  Administered 2021-06-07 – 2021-06-09 (×3): 237 mL via ORAL

## 2021-06-07 MED ORDER — PROPOFOL 500 MG/50ML IV EMUL
INTRAVENOUS | Status: AC
  Filled 2021-06-07: qty 50

## 2021-06-07 MED ORDER — PROPOFOL 500 MG/50ML IV EMUL
INTRAVENOUS | Status: DC | PRN
  Administered 2021-06-07: 100 ug/kg/min via INTRAVENOUS

## 2021-06-07 MED ORDER — SODIUM CHLORIDE 0.9 % IV SOLN: INTRAVENOUS | Status: DC

## 2021-06-07 MED ORDER — LACTATED RINGERS IV SOLN: INTRAVENOUS | Status: DC | PRN

## 2021-06-07 MED ORDER — LIDOCAINE 2% (20 MG/ML) 5 ML SYRINGE
INTRAMUSCULAR | Status: DC | PRN
  Administered 2021-06-07: 100 mg via INTRAVENOUS

## 2021-06-07 MED ORDER — FLUCONAZOLE 150 MG PO TABS
150.0000 mg | ORAL_TABLET | Freq: Once | ORAL | Status: DC
  Filled 2021-06-07: qty 1

## 2021-06-07 NOTE — Anesthesia Postprocedure Evaluation (Signed)
Anesthesia Post Note  Patient: Janet Ramos  Procedure(s) Performed: ESOPHAGOGASTRODUODENOSCOPY (EGD)     Patient location during evaluation: Endoscopy Anesthesia Type: MAC Level of consciousness: awake and alert Pain management: pain level controlled Vital Signs Assessment: post-procedure vital signs reviewed and stable Respiratory status: spontaneous breathing, nonlabored ventilation, respiratory function stable and patient connected to nasal cannula oxygen Cardiovascular status: blood pressure returned to baseline and stable Postop Assessment: no apparent nausea or vomiting Anesthetic complications: no   No notable events documented.  Last Vitals:  Vitals:   06/07/21 1340 06/07/21 1345  BP: (!) 141/86 (!) 151/90  Pulse: (!) 116 (!) 104  Resp: 19 (!) 22  Temp:    SpO2: 100% 98%    Last Pain:  Vitals:   06/07/21 1345  TempSrc:   PainSc: 0-No pain                 Barnet Glasgow

## 2021-06-07 NOTE — Anesthesia Preprocedure Evaluation (Addendum)
Anesthesia Evaluation  Patient identified by MRN, date of birth, ID band Patient awake    Reviewed: Allergy & Precautions, Patient's Chart, lab work & pertinent test results  Airway Mallampati: II  TM Distance: >3 FB Neck ROM: Full    Dental no notable dental hx. (+) Teeth Intact, Dental Advisory Given   Pulmonary Current Smoker and Patient abstained from smoking.,    Pulmonary exam normal breath sounds clear to auscultation       Cardiovascular negative cardio ROS Normal cardiovascular exam Rhythm:Regular Rate:Normal     Neuro/Psych Seizures -,  Bipolar Disorder    GI/Hepatic GERD  ,(+)     substance abuse  alcohol use,   Endo/Other  negative endocrine ROS  Renal/GU      Musculoskeletal   Abdominal   Peds  Hematology Lab Results      Component                Value               Date                      WBC                      5.2                 06/07/2021                HGB                      11.4 (L)            06/07/2021                HCT                      32.8 (L)            06/07/2021                MCV                      99.4                06/07/2021                PLT                      72 (L)              06/07/2021              Anesthesia Other Findings   Reproductive/Obstetrics                            Anesthesia Physical Anesthesia Plan  ASA: 3  Anesthesia Plan: MAC   Post-op Pain Management:    Induction:   PONV Risk Score and Plan: Treatment may vary due to age or medical condition  Airway Management Planned: Nasal Cannula and Natural Airway  Additional Equipment: None  Intra-op Plan:   Post-operative Plan:   Informed Consent: I have reviewed the patients History and Physical, chart, labs and discussed the procedure including the risks, benefits and alternatives for the proposed anesthesia with the patient or authorized representative who has  indicated his/her understanding and acceptance.     Dental advisory given  Plan Discussed with:   Anesthesia Plan Comments: (Wt loss for EGD)        Anesthesia Quick Evaluation

## 2021-06-07 NOTE — Transfer of Care (Signed)
Immediate Anesthesia Transfer of Care Note  Patient: Janet Ramos  Procedure(s) Performed: ESOPHAGOGASTRODUODENOSCOPY (EGD)  Patient Location: Endoscopy Unit  Anesthesia Type:MAC  Level of Consciousness: awake  Airway & Oxygen Therapy: Patient Spontanous Breathing  Post-op Assessment: Report given to RN and Post -op Vital signs reviewed and stable  Post vital signs: Reviewed and stable  Last Vitals:  Vitals Value Taken Time  BP 138/96 06/07/21 1330  Temp    Pulse 115 06/07/21 1334  Resp 21 06/07/21 1334  SpO2 98 % 06/07/21 1334  Vitals shown include unvalidated device data.  Last Pain:  Vitals:   06/07/21 1330  TempSrc:   PainSc: 0-No pain      Patients Stated Pain Goal: 2 (16/57/90 3833)  Complications: No notable events documented.

## 2021-06-07 NOTE — Progress Notes (Signed)
Initial Nutrition Assessment  DOCUMENTATION CODES:   Non-severe (moderate) malnutrition in context of social or environmental circumstances  INTERVENTION:   Once diet advanced: -Ensure Plus PO BID, each provides 350 kcals and 13g protein  NUTRITION DIAGNOSIS:   Moderate Malnutrition related to social / environmental circumstances as evidenced by energy intake < or equal to 75% for > or equal to 1 month, percent weight loss.  GOAL:   Patient will meet greater than or equal to 90% of their needs  MONITOR:   PO intake, Diet advancement, Labs, Weight trends, I & O's  REASON FOR ASSESSMENT:   Malnutrition Screening Tool    ASSESSMENT:   60 year old black female with multiple medical problems listed below who was admitted to the hospital with some nausea and vomiting.  She has a longstanding history of alcohol abuse and drinks about 6-12 beers per day and has had a 40 pound weight loss in the last few months.  Patient laying in bed with eyes closed upon visit. Was NPO awaiting EGD this morning. Pt reports not eating for a month, states she was not eating anything at all during this time. Pt with history of EtOH abuse, suspect she was drinking instead of eating. Pt was having episodes of N/V PTA. Pt agreeable to trying Ensure supplements once diet is advanced.  Pt reports 40 lbs of weight loss. Per weight records, pt has lost  26 lbs since 09/14/20 (14% wt loss x 9 months, significant for time frame).  Medications: Lactated ringers  Labs reviewed: Low K  NUTRITION - FOCUSED PHYSICAL EXAM:  No depletions noted.  Diet Order:   Diet Order             Diet NPO time specified  Diet effective now                   EDUCATION NEEDS:   No education needs have been identified at this time  Skin:  Skin Assessment: Reviewed RN Assessment  Last BM:  8/19  Height:   Ht Readings from Last 1 Encounters:  03/13/21 '5\' 4"'$  (1.626 m)    Weight:   Wt Readings from Last 1  Encounters:  06/07/21 72.2 kg    BMI:  Body mass index is 27.32 kg/m.  Estimated Nutritional Needs:   Kcal:  1700-1900  Protein:  90-105g  Fluid:  1.9L/day   Clayton Bibles, MS, RD, LDN Inpatient Clinical Dietitian Contact information available via Amion

## 2021-06-07 NOTE — Progress Notes (Signed)
[facility]                                                                              Patient Demographics  Annetta Stentz, is a 60 y.o. female, DOB - 12-08-1960, [id number]  Admit date - 06/06/2021   Admitting Physician Jacqlyn Krauss, MD  Outpatient Primary MD for the patient is Kerin Perna, NP  Outpatient specialists:   LOS - 1  days   Medical records reviewed and are as summarized below:    Chief Complaint  Patient presents with   Abdominal Pain   Flank Pain       Brief summary   Patient is a 60 year old female with history of bipolar disorder, seizure, alcohol use, tobacco use presented to ED with nausea and vomiting.  Patient has longstanding history of alcohol use, drinks 6 12 oz beers per day and had 40 pound weight loss in the last few months.  Patient reports no prior GI work-up.  Also smokes half pack per day for last 3025 years. Patient reports some vomiting every day with bright red blood, no hematochezia or melena.  Also reported shortness of breath over the last month but no coughing. On admission noted to have elevated LFTs.  CT abdomen showed no convincing evidence of pancreatitis but fatty liver with focal fatty sparing of gallbladder fossa, distended urinary bladder and diverticulosis. Patient reports t having ?dysphagia, chronic constipation,  Assessment & Plan    Principal Problem:   Nausea and vomiting, anorexia, abdominal pain, weight loss, transaminitis -Unclear etiology, suspect gastritis due to alcohol abuse.  Lipase 197 on admission, CT abdomen showed no convincing evidence of pancreatitis -Continue PPI, GI consulted, plan for endoscopy - continue n.p.o. status, IV fluids, pain control, antiemetics  Active Problems: Hematemesis -No hematemesis since admission, likely secondary to presumed gastritis from alcohol use, NSAIDs. -Continue PPI, follow EGD results  Transaminitis -CT abdomen showed very severe  hepatic steatosis -LFTs improving, hepatitis panel negative   Shortness of breath likely due to COPD, longstanding smoking history -CT chest showed no acute findings to account for patient's symptoms, aortic atherosclerosis -2D echo in 06/2019 which showed EF of 60 to 65%  Chronic idiopathic constipation -Will place on bowel regimen after endoscopy and possible colonoscopy completed  -TSH 3.0  Vaginal itching -Patient follows with OB/GYN outpatient. -Diflucan 150 mg p.o. x1 -Admitting physician discussed with Dr. Gertie Gowda (OB/GYN ) who recommended outpatient follow-up and will start the patient on Provera 10 mg for 30 days.   Alcohol use -Continue seizure precaution, placed on CIWA protocol, counseled on alcohol cessation -Continue thiamine, folate    Code Status: Full CODE STATUS DVT Prophylaxis:  SCDs Start: 06/06/21 0742   Level of Care: Level of care: Telemetry Family Communication: Discussed all imaging results, lab results, explained to the patient   Disposition Plan:     Status is: Inpatient  Remains inpatient appropriate because:Inpatient level of care appropriate due to severity of illness  Dispo: The patient is from: Home              Anticipated d/c is to: Home  Patient currently is not medically stable to d/c.   Difficult to place patient No      Time Spent in minutes   35 minutes  Procedures:  None  Consultants:   Gastroenterology  Antimicrobials:   Anti-infectives (From admission, onward)    Start     Dose/Rate Route Frequency Ordered Stop   06/07/21 0845  fluconazole (DIFLUCAN) tablet 150 mg        150 mg Oral  Once 06/07/21 0753            Medications  Scheduled Meds:  atorvastatin  20 mg Oral Daily   fluconazole  150 mg Oral Once   folic acid  1 mg Oral Daily   LORazepam  0-4 mg Oral Q6H   Followed by   Derrill Memo ON 06/08/2021] LORazepam  0-4 mg Oral Q12H   medroxyPROGESTERone  10 mg Oral Daily   multivitamin with minerals   1 tablet Oral Daily   nicotine  14 mg Transdermal Daily   pantoprazole (PROTONIX) IV  40 mg Intravenous Q12H   risperiDONE  0.25 mg Oral QHS   thiamine  100 mg Oral Daily   Or   thiamine  100 mg Intravenous Daily   Continuous Infusions:  sodium chloride     sodium chloride     sodium chloride 50 mL/hr at 06/06/21 2130   PRN Meds:.sodium chloride, acetaminophen **OR** acetaminophen, HYDROcodone-acetaminophen, LORazepam **OR** LORazepam, morphine injection, ondansetron **OR** ondansetron (ZOFRAN) IV      Subjective:   Ketra Segarra was seen and examined today.  Nauseated but no vomiting.  Patient denies dizziness, chest pain, new weakness, numbess, tingling. No acute events overnight.  No fevers or chills.  Objective:   Vitals:   06/06/21 2347 06/07/21 0204 06/07/21 0408 06/07/21 1044  BP: 131/81  115/82 (!) 140/103  Pulse: 98  (!) 105 (!) 106  Resp: 20  20   Temp: 99.4 F (37.4 C)  98.7 F (37.1 C)   TempSrc: Oral  Oral   SpO2: 94%  94%   Weight:  72.2 kg      Intake/Output Summary (Last 24 hours) at 06/07/2021 1122 Last data filed at 06/07/2021 0600 Gross per 24 hour  Intake 410.58 ml  Output --  Net 410.58 ml     Wt Readings from Last 3 Encounters:  06/07/21 72.2 kg  03/13/21 79.3 kg  09/14/20 84.4 kg     Exam General: Alert and oriented x 3, NAD Cardiovascular: S1 S2 auscultated, no murmurs, RRR Respiratory: Clear to auscultation bilaterally, no wheezing Gastrointestinal: Soft, mild epigastric TTP , nondistended, + bowel sounds Ext: no pedal edema bilaterally Neuro: no new deficits Skin: No rashes Psych: Normal affect and demeanor, alert and oriented x3    Data Reviewed:  I have personally reviewed following labs and imaging studies  Micro Results Recent Results (from the past 240 hour(s))  SARS CORONAVIRUS 2 (TAT 6-24 HRS) Nasopharyngeal Nasopharyngeal Swab     Status: None   Collection Time: 06/06/21 10:04 PM   Specimen: Nasopharyngeal Swab   Result Value Ref Range Status   SARS Coronavirus 2 NEGATIVE NEGATIVE Final    Comment: (NOTE) SARS-CoV-2 target nucleic acids are NOT DETECTED.  The SARS-CoV-2 RNA is generally detectable in upper and lower respiratory specimens during the acute phase of infection. Negative results do not preclude SARS-CoV-2 infection, do not rule out co-infections with other pathogens, and should not be used as the sole basis for treatment or other patient management decisions. Negative results  must be combined with clinical observations, patient history, and epidemiological information. The expected result is Negative.  Fact Sheet for Patients: SugarRoll.be  Fact Sheet for Healthcare Providers: https://www.woods-mathews.com/  This test is not yet approved or cleared by the [place] FDA and  has been authorized for detection and/or diagnosis of SARS-CoV-2 by FDA under an Emergency Use Authorization (EUA). This EUA will remain  in effect (meaning this test can be used) for the duration of the COVID-19 declaration under Se ction 564(b)(1) of the Act, 21 U.S.C. section 360bbb-3(b)(1), unless the authorization is terminated or revoked sooner.  Performed at [facility], [place] [address], [place], [place] [address]     Radiology Reports CT CHEST WO CONTRAST  Result Date: 06/06/2021 CLINICAL DATA:  60 year old female with history of respiratory failure. Shortness of breath. EXAM: CT CHEST WITHOUT CONTRAST TECHNIQUE: Multidetector CT imaging of the chest was performed following the standard protocol without IV contrast. COMPARISON:  Chest CT 08/03/2019. FINDINGS: Cardiovascular: Heart size is normal. There is no significant pericardial fluid, thickening or pericardial calcification. There is aortic atherosclerosis, as well as atherosclerosis of the great vessels of the mediastinum and the coronary arteries, including calcified atherosclerotic plaque in  the left main and left anterior descending coronary arteries. Mediastinum/Nodes: No pathologically enlarged mediastinal or hilar lymph nodes. Please note that accurate exclusion of hilar adenopathy is limited on noncontrast CT scans. Calcified subcarinal lymph nodes incidentally noted. Esophagus is unremarkable in appearance. Lungs/Pleura: No acute consolidative airspace disease. No pleural effusions. No suspicious appearing pulmonary nodules or masses are noted. Mild dependent subsegmental atelectasis or scarring noted in the lower lobes of the lungs bilaterally (right greater than left). Upper Abdomen: Diffuse low attenuation throughout the visualized hepatic parenchyma, indicative of very severe hepatic steatosis. Musculoskeletal: There are no aggressive appearing lytic or blastic lesions noted in the visualized portions of the skeleton. IMPRESSION: 1. No acute findings to account for the patient's symptoms. 2. Aortic atherosclerosis, in addition to left main and left anterior descending coronary artery disease. Please note that although the presence of coronary artery calcium documents the presence of coronary artery disease, the severity of this disease and any potential stenosis cannot be assessed on this non-gated CT examination. Assessment for potential risk factor modification, dietary therapy or pharmacologic therapy may be warranted, if clinically indicated. 3. Very severe hepatic steatosis. Aortic Atherosclerosis (ICD10-I70.0). Electronically Signed   By: Vinnie Langton M.D.   On: 06/06/2021 08:21   CT ABDOMEN PELVIS W CONTRAST  Result Date: 06/06/2021 CLINICAL DATA:  60 year old female with abdominal and flank pain. Unintentional weight loss. Abnormal lipase. EXAM: CT ABDOMEN AND PELVIS WITH CONTRAST TECHNIQUE: Multidetector CT imaging of the abdomen and pelvis was performed using the standard protocol following bolus administration of intravenous contrast. CONTRAST:  63m OMNIPAQUE IOHEXOL 350  MG/ML SOLN COMPARISON:  CT Abdomen and Pelvis 05/03/2020 and earlier. FINDINGS: Lower chest: Stable mild elevation of the right hemidiaphragm. Cardiac size is at the upper limits of normal. No pericardial or pleural effusion. Hepatobiliary: Pronounced hepatic steatosis now. Probable fatty sparing near the gallbladder fossa on series 2, image 20. Contracted gallbladder. No bile duct enlargement. Pancreas: CT appearance of the pancreas is within normal limits. Spleen: Negative. Adrenals/Urinary Tract: Negative adrenal glands and kidneys. Symmetric renal enhancement and contrast excretion. Distended urinary bladder (470 mL), but otherwise unremarkable. Occasional pelvic phleboliths. Stomach/Bowel: Mild to moderate diverticulosis of the large bowel from the splenic flexure to the proximal sigmoid. Occasional transverse colon diverticula. Mild diverticulosis  of the ascending colon. No active inflammation. Normal appendix on series 2, image 62. Negative terminal ileum. No dilated small bowel. Decompressed stomach and duodenum. Duodenal diverticulum measuring 2.5 cm on series 2, image 29 is chronic with no active inflammation. No free air or free fluid. Vascular/Lymphatic: Major vascular structures in the abdomen and pelvis appear patent. Minimal aortic calcified atherosclerosis. No lymphadenopathy. Reproductive: Negative. Other: No pelvic free fluid. 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 Lower lumbar facet degeneration. Pubic symphysis degeneration. No acute osseous abnormality identified. IMPRESSION: 1. No convincing CT evidence of pancreatitis, but CT can be normal in mild acute pancreatitis. 2. Pronounced Fatty Liver Disease now. Suspect focal fatty sparing near the gallbladder fossa. 3. Distended urinary bladder (470 mL).  Query urinary retention. 4. Diverticulosis of the duodenum and large bowel without active inflammation. Normal appendix. Electronically Signed   By:  Genevie Ann M.D.   On: 06/06/2021 06:26   DG CHEST PORT 1 VIEW  Result Date: 06/06/2021 CLINICAL DATA:  60 year old female with history of shortness of breath. EXAM: PORTABLE CHEST 1 VIEW COMPARISON:  Chest x-ray 03/11/2018. FINDINGS: Lung volumes are normal. No consolidative airspace disease. No pleural effusions. No pneumothorax. No pulmonary nodule or mass noted. Pulmonary vasculature and the cardiomediastinal silhouette are within normal limits. IMPRESSION: No radiographic evidence of acute cardiopulmonary disease. Electronically Signed   By: Vinnie Langton M.D.   On: 06/06/2021 08:18    Lab Data:  CBC: Recent Labs  Lab 06/06/21 0256 06/07/21 0501  WBC 6.7 5.2  NEUTROABS 3.9  --   HGB 12.7 11.4*  HCT 35.1* 32.8*  MCV 95.9 99.4  PLT 90* 72*   Basic Metabolic Panel: Recent Labs  Lab 06/06/21 0256 06/07/21 0501  NA 136 137  K 3.6 3.4*  CL 102 106  CO2 22 24  GLUCOSE 105* 89  BUN <5* <5*  CREATININE 0.42* 0.47  CALCIUM 9.0 8.5*  MG 1.8 1.9  PHOS 2.9  --    GFR: Estimated Creatinine Clearance: 74.7 mL/min (by C-G formula based on SCr of 0.47 mg/dL). Liver Function Tests: Recent Labs  Lab 06/06/21 0256 06/07/21 0501  AST 318* 165*  ALT 104* 76*  ALKPHOS 165* 142*  BILITOT 2.0* 1.9*  PROT 7.5 6.5  ALBUMIN 3.1* 2.6*   Recent Labs  Lab 06/06/21 0256 06/07/21 0501  LIPASE 197* 107*   No results for input(s): AMMONIA in the last 168 hours. Coagulation Profile: Recent Labs  Lab 06/07/21 0501  INR 1.2   Cardiac Enzymes: No results for input(s): CKTOTAL, CKMB, CKMBINDEX, TROPONINI in the last 168 hours. BNP (last 3 results) No results for input(s): PROBNP in the last 8760 hours. HbA1C: Recent Labs    06/06/21 0256  HGBA1C 5.3   CBG: No results for input(s): GLUCAP in the last 168 hours. Lipid Profile: No results for input(s): CHOL, HDL, LDLCALC, TRIG, CHOLHDL, LDLDIRECT in the last 72 hours. Thyroid Function Tests: Recent Labs    06/06/21 0821  TSH  3.002   Anemia Panel: No results for input(s): VITAMINB12, FOLATE, FERRITIN, TIBC, IRON, RETICCTPCT in the last 72 hours. Urine analysis:    Component Value Date/Time   COLORURINE YELLOW 06/06/2021 0300   APPEARANCEUR CLEAR 06/06/2021 0300   LABSPEC 1.001 (L) 06/06/2021 0300   PHURINE 6.0 06/06/2021 0300   GLUCOSEU NEGATIVE 06/06/2021 0300   HGBUR LARGE (A) 06/06/2021 0300   BILIRUBINUR NEGATIVE 06/06/2021 0300   BILIRUBINUR negative 01/02/2017 1513   KETONESUR NEGATIVE 06/06/2021  0300   PROTEINUR NEGATIVE 06/06/2021 0300   UROBILINOGEN 0.2 01/02/2017 1513   UROBILINOGEN 0.2 09/16/2014 1016   NITRITE NEGATIVE 06/06/2021 0300   LEUKOCYTESUR MODERATE (A) 06/06/2021 0300     Regis Wiland M.D. [facility] 06/07/2021, 11:22 AM  Available via Epic secure chat 7am-7pm After 7 pm, please refer to night coverage provider listed on amion.

## 2021-06-07 NOTE — Op Note (Signed)
[facility] Patient Name: Janet Ramos Procedure Date: 06/07/2021 MRN: [id number] Attending MD: Carol Ada , MD Date of Birth: [birth year] CSN: [id number] Age: 60 Admit Type: Inpatient Procedure:                Upper GI endoscopy Indications:              Nausea with vomiting Providers:                Carol Ada, MD, Mikey College, RN, Cherylynn Ridges, Technician Referring MD:              Medicines:                Propofol per Anesthesia Complications:            No immediate complications. Estimated Blood Loss:     Estimated blood loss: none. Procedure:                Pre-Anesthesia Assessment:                           - Prior to the procedure, a History and Physical                            was performed, and patient medications and                            allergies were reviewed. The patient's tolerance of                            previous anesthesia was also reviewed. The risks                            and benefits of the procedure and the sedation                            options and risks were discussed with the patient.                            All questions were answered, and informed consent                            was obtained. Prior Anticoagulants: The patient has                            taken no previous anticoagulant or antiplatelet                            agents. ASA Grade Assessment: III - A patient with                            severe systemic disease. After reviewing the risks  and benefits, the patient was deemed in                            satisfactory condition to undergo the procedure.                           - Sedation was administered by an anesthesia                            professional. Deep sedation was attained.                           After obtaining informed consent, the endoscope was                            passed under direct vision. Throughout  the                            procedure, the patient's blood pressure, pulse, and                            oxygen saturations were monitored continuously. The                            [id number] Olympus endoscope was introduced                            through the mouth, and advanced to the second part                            of duodenum. The upper GI endoscopy was                            accomplished without difficulty. The patient                            tolerated the procedure well. Scope In: Scope Out: Findings:      The esophagus was normal.      Mild portal hypertensive gastropathy was found in the gastric fundus and       in the gastric body.      The examined duodenum was normal.      There was no evidence of any esophagitis. The gastric mucosa was       suspicious for a portal HTN gastropathy, but there were no findings of       esophageal varices or gastric varices. Review of the patient's liver       enzymes show that she has an ETOH hepatitis. This does cause nausea and       vomiting. Impression:               - Normal esophagus.                           - Portal hypertensive gastropathy.                           -  Normal examined duodenum.                           - No specimens collected. Moderate Sedation:      Not Applicable - Patient had care per Anesthesia. Recommendation:           - Return patient to hospital ward for ongoing care.                           - Resume regular diet.                           - Continue present medications.                           - No ETOH. Procedure Code(s):        --- Professional ---                           [phone], Esophagogastroduodenoscopy, flexible,                            transoral; diagnostic, including collection of                            specimen(s) by brushing or washing, when performed                            (separate procedure) Diagnosis Code(s):        --- Professional ---                            K76.6, Portal hypertension                           K31.89, Other diseases of stomach and duodenum                           R11.2, Nausea with vomiting, unspecified CPT copyright 2019 [employer]. All rights reserved. The codes documented in this report are preliminary and upon coder review may  be revised to meet current compliance requirements. Carol Ada, MD Carol Ada, MD 06/07/2021 1:29:48 PM This report has been signed electronically. Number of Addenda: 0

## 2021-06-07 NOTE — Interval H&P Note (Signed)
History and Physical Interval Note:  06/07/2021 12:57 PM  Janet Ramos  has presented today for surgery, with the diagnosis of Epigastric pain with nausea vomiting and 40 pound weight loss.  The various methods of treatment have been discussed with the patient and family. After consideration of risks, benefits and other options for treatment, the patient has consented to  Procedure(s) with comments: ESOPHAGOGASTRODUODENOSCOPY (EGD) (N/A) - Epigastric pain with nausea vomiting and 40 pound weight loss as a surgical intervention.  The patient's history has been reviewed, patient examined, no change in status, stable for surgery.  I have reviewed the patient's chart and labs.  Questions were answered to the patient's satisfaction.     Dmetrius Ambs D

## 2021-06-08 ENCOUNTER — Encounter (HOSPITAL_COMMUNITY): Payer: Self-pay | Admitting: Gastroenterology

## 2021-06-08 ENCOUNTER — Other Ambulatory Visit: Payer: Self-pay

## 2021-06-08 DIAGNOSIS — R1010 Upper abdominal pain, unspecified: Secondary | ICD-10-CM | POA: Diagnosis not present

## 2021-06-08 DIAGNOSIS — F10239 Alcohol dependence with withdrawal, unspecified: Secondary | ICD-10-CM | POA: Diagnosis not present

## 2021-06-08 DIAGNOSIS — K852 Alcohol induced acute pancreatitis without necrosis or infection: Secondary | ICD-10-CM | POA: Diagnosis not present

## 2021-06-08 LAB — COMPREHENSIVE METABOLIC PANEL
ALT: 69 U/L — ABNORMAL HIGH (ref 0–44)
AST: 153 U/L — ABNORMAL HIGH (ref 15–41)
Albumin: 2.5 g/dL — ABNORMAL LOW (ref 3.5–5.0)
Alkaline Phosphatase: 127 U/L — ABNORMAL HIGH (ref 38–126)
Anion gap: 9 (ref 5–15)
BUN: <5 mg/dL — ABNORMAL LOW (ref 6–20)
CO2: 24 mmol/L (ref 22–32)
Calcium: 8.7 mg/dL — ABNORMAL LOW (ref 8.9–10.3)
Chloride: 104 mmol/L (ref 98–111)
Creatinine, Ser: 0.48 mg/dL (ref 0.44–1.00)
GFR, Estimated: >60 mL/min (ref >60)
Glucose, Bld: 79 mg/dL (ref 70–99)
Potassium: 3 mmol/L — ABNORMAL LOW (ref 3.5–5.1)
Sodium: 137 mmol/L (ref 135–145)
Total Bilirubin: 2.5 mg/dL — ABNORMAL HIGH (ref 0.3–1.2)
Total Protein: 6.6 g/dL (ref 6.5–8.1)

## 2021-06-08 LAB — GLUCOSE, CAPILLARY: Glucose-Capillary: 138 mg/dL — ABNORMAL HIGH (ref 70–99)

## 2021-06-08 MED ORDER — FLUCONAZOLE 150 MG PO TABS
150.0000 mg | ORAL_TABLET | ORAL | 0 refills | Status: DC | PRN
  Filled 2021-06-08: qty 5, 15d supply, fill #0

## 2021-06-08 MED ORDER — LACTATED RINGERS IV SOLN: INTRAVENOUS | Status: DC

## 2021-06-08 MED ORDER — MEDROXYPROGESTERONE ACETATE 10 MG PO TABS
10.0000 mg | ORAL_TABLET | Freq: Every day | ORAL | 0 refills | Status: DC
  Filled 2021-06-08: qty 30, 30d supply, fill #0

## 2021-06-08 MED ORDER — ONDANSETRON 4 MG PO TBDP
4.0000 mg | ORAL_TABLET | Freq: Three times a day (TID) | ORAL | 0 refills | Status: DC | PRN
  Filled 2021-06-08: qty 20, 7d supply, fill #0

## 2021-06-08 MED ORDER — DM-GUAIFENESIN ER 30-600 MG PO TB12
1.0000 | ORAL_TABLET | Freq: Two times a day (BID) | ORAL | Status: DC | PRN
Start: 2021-06-08 — End: 2021-10-05

## 2021-06-08 MED ORDER — POTASSIUM CHLORIDE CRYS ER 10 MEQ PO TBCR
40.0000 meq | EXTENDED_RELEASE_TABLET | Freq: Once | ORAL | Status: AC
  Administered 2021-06-08: 40 meq via ORAL
  Filled 2021-06-08: qty 4

## 2021-06-08 MED ORDER — SODIUM CHLORIDE 0.9 % IV BOLUS
1000.0000 mL | Freq: Once | INTRAVENOUS | Status: AC
  Administered 2021-06-08: 1000 mL via INTRAVENOUS

## 2021-06-08 MED ORDER — RISPERIDONE 1 MG PO TABS
1.0000 mg | ORAL_TABLET | Freq: Every day | ORAL | Status: DC
  Administered 2021-06-08: 1 mg via ORAL
  Filled 2021-06-08: qty 1

## 2021-06-08 MED ORDER — PANTOPRAZOLE SODIUM 40 MG PO TBEC
40.0000 mg | DELAYED_RELEASE_TABLET | Freq: Every day | ORAL | 1 refills | Status: DC
  Filled 2021-06-08: qty 30, 30d supply, fill #0

## 2021-06-08 NOTE — Progress Notes (Signed)
[facility]                                                                              Patient Demographics  Janet Ramos, is a 60 y.o. female, DOB - 01/01/1961, [id number]  Admit date - 06/06/2021   Admitting Physician Jacqlyn Krauss, MD  Outpatient Primary MD for the patient is Janet Perna, NP  Outpatient specialists:   LOS - 2  days   Medical records reviewed and are as summarized below:    Chief Complaint  Patient presents with   Abdominal Pain   Flank Pain       Brief summary   Patient is a 60 year old female with history of bipolar disorder, seizure, alcohol use, tobacco use presented to ED with nausea and vomiting.  Patient has longstanding history of alcohol use, drinks 6 12 oz beers per day and had 40 pound weight loss in the last few months.  Patient reports no prior GI work-up.  Also smokes half pack per day for last 3025 years. Patient reports some vomiting every day with bright red blood, no hematochezia or melena.  Also reported shortness of breath over the last month but no coughing. On admission noted to have elevated LFTs.  CT abdomen showed no convincing evidence of pancreatitis but fatty liver with focal fatty sparing of gallbladder fossa, distended urinary bladder and diverticulosis. Patient reports t having ?dysphagia, chronic constipation,  Assessment & Plan    Principal Problem:   Nausea and vomiting, anorexia, abdominal pain, weight loss, transaminitis -Unclear etiology, suspect gastritis due to alcohol abuse.  Lipase 197 on admission, CT abdomen showed no convincing evidence of pancreatitis -EGD 8/25 showed normal esophagus, portal hypertensive gastropathy -Tolerating diet  Active Problems: Hematemesis -No hematemesis since admission, likely secondary to presumed gastritis from alcohol use, NSAIDs. -Continue PPI, EGD essentially normal except portal hypertensive gastropathy no acute bleeding  Alcohol abuse with  acute alcohol withdrawals -Continue seizure precautions, CIWA protocol with Ativan -Continue thiamine, folate  Transaminitis -CT abdomen showed very severe hepatic steatosis, likely due to alcohol use -LFTs improving, hepatitis panel negative   Shortness of breath likely due to COPD, longstanding smoking history -CT chest showed no acute findings to account for patient's symptoms, aortic atherosclerosis -2D echo in 06/2019 which showed EF of 60 to 65%  Chronic idiopathic constipation -Will place on bowel regimen after endoscopy and possible colonoscopy completed  -TSH 3.0  Vaginal itching -Patient follows with OB/GYN outpatient. -Diflucan 150 mg p.o. x1 on 8/25 -Admitting physician discussed with Dr. Gertie Gowda (OB/GYN ) who recommended outpatient follow-up and start the patient on Provera 10 mg for 30 days.    Code Status: Full CODE STATUS DVT Prophylaxis:  SCDs Start: 06/06/21 0742   Level of Care: Level of care: Telemetry Family Communication: Discussed all imaging results, lab results, explained to the patient   Disposition Plan:     Status is: Inpatient  Remains inpatient appropriate because:Inpatient level of care appropriate due to severity of illness  Dispo: The patient is from: Home              Anticipated d/c is to: Home  Patient currently is not medically stable to d/c.  Having acute alcohol withdrawals   Difficult to place patient No      Time Spent in minutes 25 minutes  Procedures:  EGD 8/25  Consultants:   Gastroenterology  Antimicrobials:   Anti-infectives (From admission, onward)    Start     Dose/Rate Route Frequency Ordered Stop   06/10/21 0000  fluconazole (DIFLUCAN) 150 MG tablet        150 mg Oral Every 72 hours PRN 06/08/21 1231     06/07/21 0845  fluconazole (DIFLUCAN) tablet 150 mg        150 mg Oral  Once 06/07/21 0753            Medications  Scheduled Meds:  atorvastatin  20 mg Oral Daily   feeding supplement  237  mL Oral BID BM   fluconazole  150 mg Oral Once   folic acid  1 mg Oral Daily   LORazepam  0-4 mg Oral Q12H   medroxyPROGESTERone  10 mg Oral Daily   multivitamin with minerals  1 tablet Oral Daily   nicotine  14 mg Transdermal Daily   pantoprazole (PROTONIX) IV  40 mg Intravenous Q12H   risperiDONE  1 mg Oral QHS   thiamine  100 mg Oral Daily   Or   thiamine  100 mg Intravenous Daily   Continuous Infusions:  sodium chloride     lactated ringers 100 mL/hr at 06/08/21 1538   PRN Meds:.sodium chloride, acetaminophen **OR** acetaminophen, HYDROcodone-acetaminophen, LORazepam **OR** LORazepam, morphine injection, ondansetron **OR** ondansetron (ZOFRAN) IV      Subjective:   [name] was seen and examined today.  At the time of my examination in a.m., alert and awake, asking for food and wants to go home.  However subsequently drowsy, borderline BP, tachycardia secondary to acute alcohol withdrawals.  Objective:   Vitals:   06/08/21 1240 06/08/21 1247 06/08/21 1401 06/08/21 1424  BP: (!) 113/98  (!) 87/76 106/78  Pulse: (!) 102  (!) 109 (!) 102  Resp: 17  15   Temp:  (!) 97.5 F (36.4 C) 99.1 F (37.3 C)   TempSrc:  Oral Oral   SpO2: 98%  95%   Weight:      Height:        Intake/Output Summary (Last 24 hours) at 06/08/2021 1550 Last data filed at 06/08/2021 1133 Gross per 24 hour  Intake 497.78 ml  Output --  Net 497.78 ml     Wt Readings from Last 3 Encounters:  06/08/21 72.8 kg  03/13/21 79.3 kg  09/14/20 84.4 kg   Physical Exam General: Alert and oriented x 3, NAD Cardiovascular: S1 S2 clear, RRR. No pedal edema b/l Respiratory: CTAB Gastrointestinal: Soft, nontender, nondistended, NBS Ext: no pedal edema bilaterally Neuro: no new deficits Skin: No rashes Psych: Normal affect and demeanor, alert and oriented x3   Data Reviewed:  I have personally reviewed following labs and imaging studies  Micro Results Recent Results (from the past 240 hour(s))   SARS CORONAVIRUS 2 (TAT 6-24 HRS) Nasopharyngeal Nasopharyngeal Swab     Status: None   Collection Time: 06/06/21 10:04 PM   Specimen: Nasopharyngeal Swab  Result Value Ref Range Status   SARS Coronavirus 2 NEGATIVE NEGATIVE Final    Comment: (NOTE) SARS-CoV-2 target nucleic acids are NOT DETECTED.  The SARS-CoV-2 RNA is generally detectable in upper and lower respiratory specimens during the acute phase of infection. Negative results do not preclude SARS-CoV-2  infection, do not rule out co-infections with other pathogens, and should not be used as the sole basis for treatment or other patient management decisions. Negative results must be combined with clinical observations, patient history, and epidemiological information. The expected result is Negative.  Fact Sheet for Patients: SugarRoll.be  Fact Sheet for Healthcare Providers: https://www.woods-mathews.com/  This test is not yet approved or cleared by the [place] FDA and  has been authorized for detection and/or diagnosis of SARS-CoV-2 by FDA under an Emergency Use Authorization (EUA). This EUA will remain  in effect (meaning this test can be used) for the duration of the COVID-19 declaration under Se ction 564(b)(1) of the Act, 21 U.S.C. section 360bbb-3(b)(1), unless the authorization is terminated or revoked sooner.  Performed at [facility], [place] [address], [place], [place] [address]     Radiology Reports CT CHEST WO CONTRAST  Result Date: 06/06/2021 CLINICAL DATA:  60 year old female with history of respiratory failure. Shortness of breath. EXAM: CT CHEST WITHOUT CONTRAST TECHNIQUE: Multidetector CT imaging of the chest was performed following the standard protocol without IV contrast. COMPARISON:  Chest CT 08/03/2019. FINDINGS: Cardiovascular: Heart size is normal. There is no significant pericardial fluid, thickening or pericardial calcification. There is  aortic atherosclerosis, as well as atherosclerosis of the great vessels of the mediastinum and the coronary arteries, including calcified atherosclerotic plaque in the left main and left anterior descending coronary arteries. Mediastinum/Nodes: No pathologically enlarged mediastinal or hilar lymph nodes. Please note that accurate exclusion of hilar adenopathy is limited on noncontrast CT scans. Calcified subcarinal lymph nodes incidentally noted. Esophagus is unremarkable in appearance. Lungs/Pleura: No acute consolidative airspace disease. No pleural effusions. No suspicious appearing pulmonary nodules or masses are noted. Mild dependent subsegmental atelectasis or scarring noted in the lower lobes of the lungs bilaterally (right greater than left). Upper Abdomen: Diffuse low attenuation throughout the visualized hepatic parenchyma, indicative of very severe hepatic steatosis. Musculoskeletal: There are no aggressive appearing lytic or blastic lesions noted in the visualized portions of the skeleton. IMPRESSION: 1. No acute findings to account for the patient's symptoms. 2. Aortic atherosclerosis, in addition to left main and left anterior descending coronary artery disease. Please note that although the presence of coronary artery calcium documents the presence of coronary artery disease, the severity of this disease and any potential stenosis cannot be assessed on this non-gated CT examination. Assessment for potential risk factor modification, dietary therapy or pharmacologic therapy may be warranted, if clinically indicated. 3. Very severe hepatic steatosis. Aortic Atherosclerosis (ICD10-I70.0). Electronically Signed   By: Vinnie Langton M.D.   On: 06/06/2021 08:21   CT ABDOMEN PELVIS W CONTRAST  Result Date: 06/06/2021 CLINICAL DATA:  60 year old female with abdominal and flank pain. Unintentional weight loss. Abnormal lipase. EXAM: CT ABDOMEN AND PELVIS WITH CONTRAST TECHNIQUE: Multidetector CT imaging  of the abdomen and pelvis was performed using the standard protocol following bolus administration of intravenous contrast. CONTRAST:  25m OMNIPAQUE IOHEXOL 350 MG/ML SOLN COMPARISON:  CT Abdomen and Pelvis 05/03/2020 and earlier. FINDINGS: Lower chest: Stable mild elevation of the right hemidiaphragm. Cardiac size is at the upper limits of normal. No pericardial or pleural effusion. Hepatobiliary: Pronounced hepatic steatosis now. Probable fatty sparing near the gallbladder fossa on series 2, image 20. Contracted gallbladder. No bile duct enlargement. Pancreas: CT appearance of the pancreas is within normal limits. Spleen: Negative. Adrenals/Urinary Tract: Negative adrenal glands and kidneys. Symmetric renal enhancement and contrast excretion. Distended urinary bladder (470 mL), but otherwise  unremarkable. Occasional pelvic phleboliths. Stomach/Bowel: Mild to moderate diverticulosis of the large bowel from the splenic flexure to the proximal sigmoid. Occasional transverse colon diverticula. Mild diverticulosis of the ascending colon. No active inflammation. Normal appendix on series 2, image 62. Negative terminal ileum. No dilated small bowel. Decompressed stomach and duodenum. Duodenal diverticulum measuring 2.5 cm on series 2, image 29 is chronic with no active inflammation. No free air or free fluid. Vascular/Lymphatic: Major vascular structures in the abdomen and pelvis appear patent. Minimal aortic calcified atherosclerosis. No lymphadenopathy. Reproductive: Negative. Other: No pelvic free fluid. 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 Lower lumbar facet degeneration. Pubic symphysis degeneration. No acute osseous abnormality identified. IMPRESSION: 1. No convincing CT evidence of pancreatitis, but CT can be normal in mild acute pancreatitis. 2. Pronounced Fatty Liver Disease now. Suspect focal fatty sparing near the gallbladder fossa. 3. Distended urinary  bladder (470 mL).  Query urinary retention. 4. Diverticulosis of the duodenum and large bowel without active inflammation. Normal appendix. Electronically Signed   By: Genevie Ann M.D.   On: 06/06/2021 06:26   DG CHEST PORT 1 VIEW  Result Date: 06/06/2021 CLINICAL DATA:  60 year old female with history of shortness of breath. EXAM: PORTABLE CHEST 1 VIEW COMPARISON:  Chest x-ray 03/11/2018. FINDINGS: Lung volumes are normal. No consolidative airspace disease. No pleural effusions. No pneumothorax. No pulmonary nodule or mass noted. Pulmonary vasculature and the cardiomediastinal silhouette are within normal limits. IMPRESSION: No radiographic evidence of acute cardiopulmonary disease. Electronically Signed   By: Vinnie Langton M.D.   On: 06/06/2021 08:18    Lab Data:  CBC: Recent Labs  Lab 06/06/21 0256 06/07/21 0501  WBC 6.7 5.2  NEUTROABS 3.9  --   HGB 12.7 11.4*  HCT 35.1* 32.8*  MCV 95.9 99.4  PLT 90* 72*   Basic Metabolic Panel: Recent Labs  Lab 06/06/21 0256 06/07/21 0501 06/08/21 0533  NA 136 137 137  K 3.6 3.4* 3.0*  CL 102 106 104  CO2 '22 24 24  '$ GLUCOSE 105* 89 79  BUN <5* <5* <5*  CREATININE 0.42* 0.47 0.48  CALCIUM 9.0 8.5* 8.7*  MG 1.8 1.9  --   PHOS 2.9  --   --    GFR: Estimated Creatinine Clearance: 74.9 mL/min (by C-G formula based on SCr of 0.48 mg/dL). Liver Function Tests: Recent Labs  Lab 06/06/21 0256 06/07/21 0501 06/08/21 0533  AST 318* 165* 153*  ALT 104* 76* 69*  ALKPHOS 165* 142* 127*  BILITOT 2.0* 1.9* 2.5*  PROT 7.5 6.5 6.6  ALBUMIN 3.1* 2.6* 2.5*   Recent Labs  Lab 06/06/21 0256 06/07/21 0501  LIPASE 197* 107*   No results for input(s): AMMONIA in the last 168 hours. Coagulation Profile: Recent Labs  Lab 06/07/21 0501  INR 1.2   Cardiac Enzymes: No results for input(s): CKTOTAL, CKMB, CKMBINDEX, TROPONINI in the last 168 hours. BNP (last 3 results) No results for input(s): PROBNP in the last 8760 hours. HbA1C: Recent Labs     06/06/21 0256  HGBA1C 5.3   CBG: Recent Labs  Lab 06/08/21 1245  GLUCAP 138*   Lipid Profile: No results for input(s): CHOL, HDL, LDLCALC, TRIG, CHOLHDL, LDLDIRECT in the last 72 hours. Thyroid Function Tests: Recent Labs    06/06/21 0821  TSH 3.002   Anemia Panel: No results for input(s): VITAMINB12, FOLATE, FERRITIN, TIBC, IRON, RETICCTPCT in the last 72 hours. Urine analysis:    Component Value Date/Time   COLORURINE  YELLOW 06/06/2021 0300   APPEARANCEUR CLEAR 06/06/2021 0300   LABSPEC 1.001 (L) 06/06/2021 0300   PHURINE 6.0 06/06/2021 0300   GLUCOSEU NEGATIVE 06/06/2021 0300   HGBUR LARGE (A) 06/06/2021 0300   BILIRUBINUR NEGATIVE 06/06/2021 0300   BILIRUBINUR negative 01/02/2017 1513   KETONESUR NEGATIVE 06/06/2021 0300   PROTEINUR NEGATIVE 06/06/2021 0300   UROBILINOGEN 0.2 01/02/2017 1513   UROBILINOGEN 0.2 09/16/2014 1016   NITRITE NEGATIVE 06/06/2021 0300   LEUKOCYTESUR MODERATE (A) 06/06/2021 0300     Lidwina Kaner M.D. [facility] 06/08/2021, 3:50 PM  Available via Epic secure chat 7am-7pm After 7 pm, please refer to night coverage provider listed on amion.

## 2021-06-08 NOTE — Progress Notes (Signed)
Subjective: No acute events.  Objective: Vital signs in last 24 hours: Temp:  [98.5 F (36.9 C)-100.5 F (38.1 C)] 98.5 F (36.9 C) (08/26 0526) Pulse Rate:  [97-116] 103 (08/26 0526) Resp:  [16-29] 16 (08/26 0526) BP: (135-151)/(86-103) 135/97 (08/26 0526) SpO2:  [95 %-100 %] 99 % (08/26 0526) Weight:  [72.8 kg] 72.8 kg (08/26 0500) Last BM Date: 06/01/21  Intake/Output from previous day: 08/25 0701 - 08/26 0700 In: 982.8 [I.V.:982.8] Out: -  Intake/Output this shift: No intake/output data recorded.  General appearance: somnolent GI: soft, non-tender; bowel sounds normal; no masses,  no organomegaly  Lab Results: Recent Labs    06/06/21 0256 06/07/21 0501  WBC 6.7 5.2  HGB 12.7 11.4*  HCT 35.1* 32.8*  PLT 90* 72*   BMET Recent Labs    06/06/21 0256 06/07/21 0501 06/08/21 0533  NA 136 137 137  K 3.6 3.4* 3.0*  CL 102 106 104  CO2 '22 24 24  '$ GLUCOSE 105* 89 79  BUN <5* <5* <5*  CREATININE 0.42* 0.47 0.48  CALCIUM 9.0 8.5* 8.7*   LFT Recent Labs    06/08/21 0533  PROT 6.6  ALBUMIN 2.5*  AST 153*  ALT 69*  ALKPHOS 127*  BILITOT 2.5*   PT/INR Recent Labs    06/07/21 0501  LABPROT 15.1  INR 1.2   Hepatitis Panel Recent Labs    06/06/21 0524  HEPBSAG NON REACTIVE  HCVAB NON REACTIVE  HEPAIGM NON REACTIVE  HEPBIGM NON REACTIVE   C-Diff No results for input(s): CDIFFTOX in the last 72 hours. Fecal Lactopherrin No results for input(s): FECLLACTOFRN in the last 72 hours.  Studies/Results: CT CHEST WO CONTRAST  Result Date: 06/06/2021 CLINICAL DATA:  60 year old female with history of respiratory failure. Shortness of breath. EXAM: CT CHEST WITHOUT CONTRAST TECHNIQUE: Multidetector CT imaging of the chest was performed following the standard protocol without IV contrast. COMPARISON:  Chest CT 08/03/2019. FINDINGS: Cardiovascular: Heart size is normal. There is no significant pericardial fluid, thickening or pericardial calcification. There is  aortic atherosclerosis, as well as atherosclerosis of the great vessels of the mediastinum and the coronary arteries, including calcified atherosclerotic plaque in the left main and left anterior descending coronary arteries. Mediastinum/Nodes: No pathologically enlarged mediastinal or hilar lymph nodes. Please note that accurate exclusion of hilar adenopathy is limited on noncontrast CT scans. Calcified subcarinal lymph nodes incidentally noted. Esophagus is unremarkable in appearance. Lungs/Pleura: No acute consolidative airspace disease. No pleural effusions. No suspicious appearing pulmonary nodules or masses are noted. Mild dependent subsegmental atelectasis or scarring noted in the lower lobes of the lungs bilaterally (right greater than left). Upper Abdomen: Diffuse low attenuation throughout the visualized hepatic parenchyma, indicative of very severe hepatic steatosis. Musculoskeletal: There are no aggressive appearing lytic or blastic lesions noted in the visualized portions of the skeleton. IMPRESSION: 1. No acute findings to account for the patient's symptoms. 2. Aortic atherosclerosis, in addition to left main and left anterior descending coronary artery disease. Please note that although the presence of coronary artery calcium documents the presence of coronary artery disease, the severity of this disease and any potential stenosis cannot be assessed on this non-gated CT examination. Assessment for potential risk factor modification, dietary therapy or pharmacologic therapy may be warranted, if clinically indicated. 3. Very severe hepatic steatosis. Aortic Atherosclerosis (ICD10-I70.0). Electronically Signed   By: Vinnie Langton M.D.   On: 06/06/2021 08:21    Medications: Scheduled:  atorvastatin  20 mg Oral Daily   feeding  supplement  237 mL Oral BID BM   fluconazole  150 mg Oral Once   folic acid  1 mg Oral Daily   LORazepam  0-4 mg Oral Q12H   medroxyPROGESTERone  10 mg Oral Daily    multivitamin with minerals  1 tablet Oral Daily   nicotine  14 mg Transdermal Daily   pantoprazole (PROTONIX) IV  40 mg Intravenous Q12H   risperiDONE  0.25 mg Oral QHS   thiamine  100 mg Oral Daily   Or   thiamine  100 mg Intravenous Daily   Continuous:  sodium chloride     sodium chloride 50 mL/hr at 06/07/21 1953    Assessment/Plan: 1) ETOH hepatitis - improving. 2) Nausea/vomiting - appears resolved.   No reports of any nausea or vomiting overnight in the chart.  The patient was very sleepy during this examination.  It was not clear if she was medicated or she refused to respond.  Blood work shows that her liver enzymes continue to normalize and her INR is normal.  Plan: 1) Continue with supportive care. 2) ETOH abstinence. 3) Encourage proper nutrition.  LOS: 2 days   Dari Carpenito D 06/08/2021, 8:10 AM

## 2021-06-08 NOTE — TOC Initial Note (Signed)
Transition of Care [facility]) - Initial/Assessment Note    Patient Details  Name: Janet Ramos MRN: [id number] Date of Birth: [birth year]  Transition of Care [facility]) CM/SW Contact:    Trish Mage, LCSW Phone Number: 06/08/2021, 11:51 AM  Clinical Narrative:    Patient seen in follow up to MD consult for alcohol use.  [name] knows her admission is related to her drinking ETOH, states she has been active in [place] in the past, and will reinvolve herself upon d/c.  Her home group is [employer], she already has a sponsor, and her husband will transport her.  She is not interested in any other resources.  TOC will continue to follow during the course of hospitalization.                Expected Discharge Plan: [place] Barriers to Discharge: No Barriers Identified   Patient Goals and CMS Choice        Expected Discharge Plan and Services Expected Discharge Plan: [place] In-house Referral: Clinical Social Work     Living arrangements for the past 2 months: [place]                                      Prior Living Arrangements/Services Living arrangements for the past 2 months: Single Family Home Lives with:: Spouse Patient language and need for interpreter reviewed:: Yes Do you feel safe going back to the place where you live?: Yes      Need for Family Participation in Patient Care: Yes (Comment) Care giver support system in place?: Yes (comment)   Criminal Activity/Legal Involvement Pertinent to Current Situation/Hospitalization: No - Comment as needed  Activities of Daily Living Home Assistive Devices/Equipment: Eyeglasses ADL Screening (condition at time of admission) Patient's cognitive ability adequate to safely complete daily activities?: Yes Is the patient deaf or have difficulty hearing?: No Does the patient have difficulty seeing, even when wearing glasses/contacts?: No Does the patient have difficulty  concentrating, remembering, or making decisions?: No Patient able to express need for assistance with ADLs?: Yes Does the patient have difficulty dressing or bathing?: No Independently performs ADLs?: Yes (appropriate for developmental age) Does the patient have difficulty walking or climbing stairs?: Yes (secondary to right sided weakness and left knee pain) Weakness of Legs: Right Weakness of Arms/Hands: Right  Permission Sought/Granted                  Emotional Assessment Appearance:: Appears stated age Attitude/Demeanor/Rapport: Engaged Affect (typically observed): Appropriate Orientation: : Oriented to Self, Oriented to Place, Oriented to  Time, Oriented to Situation Alcohol / Substance Use: Alcohol Use Psych Involvement: No (comment)  Admission diagnosis:  SOB (shortness of breath) [R06.02] Abdominal pain [R10.9] Alcohol-induced acute pancreatitis, unspecified complication status [id number] Patient Active Problem List   Diagnosis Date Noted   Malnutrition of moderate degree 06/07/2021   Abdominal pain 06/06/2021   Vaginal itching 06/06/2021   Nausea and vomiting 06/06/2021   Hematemesis 06/06/2021   Tobacco abuse 06/06/2021   Weight loss 06/06/2021   Alcohol withdrawal syndrome with complication, with unspecified complication ([place]) [id number]   Thickened endometrium 02/09/2018   Substance induced mood disorder ([place]) 10/31/2017   Left knee pain 08/28/2015   Headache 05/08/2015   Hyperlipidemia 05/08/2015   Hypokalemia 05/08/2015   Right sided weakness 05/07/2015   Syncope 05/07/2015   Polysubstance  abuse ([place]) 05/07/2015   Rupture of globe 09/16/2014   PCP:  Kerin Perna, NP Pharmacy:   [facility] [address] [place] [address] Phone: [phone] Fax: [phone]     Social Determinants of Health (SDOH) Interventions    Readmission Risk Interventions Readmission Risk Prevention Plan 07/09/2019   Transportation Screening Complete  PCP or Specialist Appt within 5-7 Days Complete  Home Care Screening Complete  Medication Review (RN CM) Complete  Some recent data might be hidden

## 2021-06-08 NOTE — Progress Notes (Signed)
   06/08/21 1401  Assess: MEWS Score  Temp 99.1 F (37.3 C)  BP (!) 87/76  Pulse Rate (!) 109  Resp 15  Level of Consciousness Alert  SpO2 95 %  O2 Device Room Air  Patient Activity (if Appropriate) In bed  Assess: MEWS Score  MEWS Temp 0  MEWS Systolic 1  MEWS Pulse 1  MEWS RR 0  MEWS LOC 0  MEWS Score 2  MEWS Score Color Yellow  Assess: if the MEWS score is Yellow or Red  Were vital signs taken at a resting state? Yes  Focused Assessment No change from prior assessment  Does the patient meet 2 or more of the SIRS criteria? No  MEWS guidelines implemented *See Row Information* Yes  Treat  Pain Scale 0-10  Pain Score 0  Take Vital Signs  Increase Vital Sign Frequency  Yellow: Q 2hr X 2 then Q 4hr X 2, if remains yellow, continue Q 4hrs  Escalate  MEWS: Escalate Yellow: discuss with charge nurse/RN and consider discussing with provider and RRT  Notify: Charge Nurse/RN  Name of Charge Nurse/RN Notified Kasia, RN  Date Charge Nurse/RN Notified 06/08/21  Time Charge Nurse/RN Notified 1416  Notify: Provider  Provider Name/Title Rai, MD  Date Provider Notified 06/08/21  Time Provider Notified 1416  Notification Type Page  Notification Reason Change in status  Provider response See new orders  Date of Provider Response 06/08/21  Time of Provider Response 1416  Document  Patient Outcome Stabilized after interventions  Progress note created (see row info) Yes  Assess: SIRS CRITERIA  SIRS Temperature  0  SIRS Pulse 1  SIRS Respirations  0  SIRS WBC 0  SIRS Score Sum  1  Pt in yellow MEWS due to low BP. MD notified. Charge RN made aware. This RN continued to follow yellow MEWS protocol.

## 2021-06-09 DIAGNOSIS — F10239 Alcohol dependence with withdrawal, unspecified: Secondary | ICD-10-CM | POA: Diagnosis not present

## 2021-06-09 DIAGNOSIS — R1013 Epigastric pain: Secondary | ICD-10-CM | POA: Diagnosis not present

## 2021-06-09 DIAGNOSIS — E7841 Elevated Lipoprotein(a): Secondary | ICD-10-CM | POA: Diagnosis not present

## 2021-06-09 DIAGNOSIS — K852 Alcohol induced acute pancreatitis without necrosis or infection: Secondary | ICD-10-CM | POA: Diagnosis not present

## 2021-06-09 MED ORDER — ATORVASTATIN CALCIUM 20 MG PO TABS: 40.0000 mg | ORAL_TABLET | Freq: Every day | ORAL | 1 refills | Status: DC

## 2021-06-09 NOTE — Discharge Summary (Signed)
Physician Discharge Summary   Patient ID: Janet Ramos MRN: [id number] DOB/AGE: Apr 15, 1961 60 y.o.  Admit date: 06/06/2021 Discharge date: 06/09/2021  Primary Care Physician:  Kerin Perna, NP   Recommendations for Outpatient Follow-up:  Follow up with PCP in 1-2 weeks 2.  Patient recommended to follow-up with OB/GYN 3.  Obtain LFTs, if improving patient may resume statin  Home Health: Home health Equipment/Devices:   Discharge Condition: stable  CODE STATUS: FULL  Diet recommendation: Heart healthy diet   Discharge Diagnoses:     Abdominal pain secondary to gastritis  Alcohol withdrawal syndrome  (HCC)  Vaginal itching  Nausea and vomiting  Hematemesis  Tobacco abuse  Weight loss COPD  Consults: Gastroenterology    Allergies:  No Known Allergies   DISCHARGE MEDICATIONS: Allergies as of 06/09/2021   No Known Allergies      Medication List     STOP taking these medications    ibuprofen 200 MG tablet Commonly known as: ADVIL       TAKE these medications    atorvastatin 20 MG tablet Commonly known as: LIPITOR Take 2 tablets (40 mg total) by mouth daily. Hold until follow-up with your primary care physician. What changed:  how much to take additional instructions   dextromethorphan-guaiFENesin 30-600 MG 12hr tablet Commonly known as: MUCINEX DM Take 1 tablet by mouth 2 (two) times daily as needed for cough.   fluconazole 150 MG tablet Commonly known as: DIFLUCAN Take 1 tablet (150 mg total) by mouth every 3 (three) days as needed (yeast infection). Start taking on: June 10, 2021   loratadine 10 MG tablet Commonly known as: CLARITIN Take 1 tablet (10 mg total) by mouth daily. What changed:  when to take this reasons to take this   medroxyPROGESTERone 10 MG tablet Commonly known as: PROVERA Take 1 tablet (10 mg total) by mouth daily.   Multi For Her 50+ Tabs Take 1 tablet by mouth daily.   ondansetron 4 MG  disintegrating tablet Commonly known as: Zofran ODT Take 1 tablet (4 mg total) by mouth every 8 (eight) hours as needed for nausea or vomiting.   pantoprazole 40 MG tablet Commonly known as: Protonix Take 1 tablet (40 mg total) by mouth daily.   risperiDONE 1 MG tablet Commonly known as: RISPERDAL TAKE 1 TABLET (1 MG TOTAL) BY MOUTH AT BEDTIME. What changed: how much to take   senna 8.6 MG Tabs tablet Commonly known as: SENOKOT Take 1 tablet (8.6 mg total) by mouth daily as needed for mild constipation.         Brief H and P: For complete details please refer to admission H and P, but in brief,  Patient is a 60 year old female with history of bipolar disorder, seizure, alcohol use, tobacco use presented to ED with nausea and vomiting.  Patient has longstanding history of alcohol use, drinks 6 12 oz beers per day and had 40 pound weight loss in the last few months.  Patient reports no prior GI work-up.  Also smokes half pack per day for last 3025 years. Patient reports some vomiting every day with bright red blood, no hematochezia or melena.  Also reported shortness of breath over the last month but no coughing. On admission noted to have elevated LFTs.  CT abdomen showed no convincing evidence of pancreatitis but fatty liver with focal fatty sparing of gallbladder fossa, distended urinary bladder and diverticulosis. Patient reports t having ?dysphagia, chronic constipation,  Hospital Course:   Nausea and  vomiting, anorexia, abdominal pain, weight loss, transaminitis -Unclear etiology, suspect gastritis due to alcohol abuse.  Lipase 197 on admission, CT abdomen showed no convincing evidence of pancreatitis -EGD 8/25 showed normal esophagus, portal hypertensive gastropathy -Tolerating diet, had breakfast and lunch today, alert and oriented, wants to go home.  Hematemesis -No hematemesis since admission, likely secondary to presumed gastritis from alcohol use, NSAIDs. -Continue  Protonix.   - EGD essentially normal except portal hypertensive gastropathy no acute bleeding   Alcohol abuse with acute alcohol withdrawals -Patient was placed on CIWA protocol with Ativan. -Currently stable, acute alcohol withdrawals improved -CIWA 0 at 8 AM   Transaminitis -CT abdomen showed very severe hepatic steatosis, likely due to alcohol use -LFTs improving, hepatitis panel negative -Hold statins until follow-up with PCP, follow LFTs     Shortness of breath likely due to COPD, longstanding smoking history -CT chest showed no acute findings to account for patient's symptoms, aortic atherosclerosis -2D echo in 06/2019 which showed EF of 60 to 65%   Chronic idiopathic constipation -Will place on bowel regimen after endoscopy and possible colonoscopy completed  -TSH 3.0   Vaginal itching -Patient follows with OB/GYN outpatient. -Diflucan 150 mg p.o. x1 on 8/25 -Admitting physician discussed with Dr. Gertie Gowda (OB/GYN ) who recommended outpatient follow-up and start the patient on Provera 10 mg for 30 days.   Day of Discharge S: Tolerating diet, wants to go home.  No acute issues.  CIWA 0  BP (!) 118/99 (BP Location: Right Arm)   Pulse (!) 101   Temp (!) 97.5 F (36.4 C)   Resp 18   Ht '5\' 4"'$  (1.626 m)   Wt 71.6 kg   LMP 03/15/2011   SpO2 100%   BMI 27.09 kg/m   Physical Exam: General: Alert and awake oriented x3 not in any acute distress. CVS: S1-S2 clear no murmur rubs or gallops Chest: clear to auscultation bilaterally, no wheezing rales or rhonchi Abdomen: soft nontender, nondistended, normal bowel sounds Extremities: no cyanosis, clubbing or edema noted bilaterally Neuro: no new FND's    Get Medicines reviewed and adjusted: Please take all your medications with you for your next visit with your Primary MD  Please request your Primary MD to go over all hospital tests and procedure/radiological results at the follow up. Please ask your Primary MD to get all  Hospital records sent to his/her office.  If you experience worsening of your admission symptoms, develop shortness of breath, life threatening emergency, suicidal or homicidal thoughts you must seek medical attention immediately by calling 911 or calling your MD immediately  if symptoms less severe.  You must read complete instructions/literature along with all the possible adverse reactions/side effects for all the Medicines you take and that have been prescribed to you. Take any new Medicines after you have completely understood and accept all the possible adverse reactions/side effects.   Do not drive when taking pain medications.   Do not take more than prescribed Pain, Sleep and Anxiety Medications  Special Instructions: If you have smoked or chewed Tobacco  in the last 2 yrs please stop smoking, stop any regular Alcohol  and or any Recreational drug use.  Wear Seat belts while driving.  Please note  You were cared for by a hospitalist during your hospital stay. Once you are discharged, your primary care physician will handle any further medical issues. Please note that NO REFILLS for any discharge medications will be authorized once you are discharged, as it is imperative  that you return to your primary care physician (or establish a relationship with a primary care physician if you do not have one) for your aftercare needs so that they can reassess your need for medications and monitor your lab values.   The results of significant diagnostics from this hospitalization (including imaging, microbiology, ancillary and laboratory) are listed below for reference.      Procedures/Studies:  CT CHEST WO CONTRAST  Result Date: 06/06/2021 CLINICAL DATA:  60 year old female with history of respiratory failure. Shortness of breath. EXAM: CT CHEST WITHOUT CONTRAST TECHNIQUE: Multidetector CT imaging of the chest was performed following the standard protocol without IV contrast. COMPARISON:   Chest CT 08/03/2019. FINDINGS: Cardiovascular: Heart size is normal. There is no significant pericardial fluid, thickening or pericardial calcification. There is aortic atherosclerosis, as well as atherosclerosis of the great vessels of the mediastinum and the coronary arteries, including calcified atherosclerotic plaque in the left main and left anterior descending coronary arteries. Mediastinum/Nodes: No pathologically enlarged mediastinal or hilar lymph nodes. Please note that accurate exclusion of hilar adenopathy is limited on noncontrast CT scans. Calcified subcarinal lymph nodes incidentally noted. Esophagus is unremarkable in appearance. Lungs/Pleura: No acute consolidative airspace disease. No pleural effusions. No suspicious appearing pulmonary nodules or masses are noted. Mild dependent subsegmental atelectasis or scarring noted in the lower lobes of the lungs bilaterally (right greater than left). Upper Abdomen: Diffuse low attenuation throughout the visualized hepatic parenchyma, indicative of very severe hepatic steatosis. Musculoskeletal: There are no aggressive appearing lytic or blastic lesions noted in the visualized portions of the skeleton. IMPRESSION: 1. No acute findings to account for the patient's symptoms. 2. Aortic atherosclerosis, in addition to left main and left anterior descending coronary artery disease. Please note that although the presence of coronary artery calcium documents the presence of coronary artery disease, the severity of this disease and any potential stenosis cannot be assessed on this non-gated CT examination. Assessment for potential risk factor modification, dietary therapy or pharmacologic therapy may be warranted, if clinically indicated. 3. Very severe hepatic steatosis. Aortic Atherosclerosis (ICD10-I70.0). Electronically Signed   By: Vinnie Langton M.D.   On: 06/06/2021 08:21   CT ABDOMEN PELVIS W CONTRAST  Result Date: 06/06/2021 CLINICAL DATA:   60 year old female with abdominal and flank pain. Unintentional weight loss. Abnormal lipase. EXAM: CT ABDOMEN AND PELVIS WITH CONTRAST TECHNIQUE: Multidetector CT imaging of the abdomen and pelvis was performed using the standard protocol following bolus administration of intravenous contrast. CONTRAST:  65m OMNIPAQUE IOHEXOL 350 MG/ML SOLN COMPARISON:  CT Abdomen and Pelvis 05/03/2020 and earlier. FINDINGS: Lower chest: Stable mild elevation of the right hemidiaphragm. Cardiac size is at the upper limits of normal. No pericardial or pleural effusion. Hepatobiliary: Pronounced hepatic steatosis now. Probable fatty sparing near the gallbladder fossa on series 2, image 20. Contracted gallbladder. No bile duct enlargement. Pancreas: CT appearance of the pancreas is within normal limits. Spleen: Negative. Adrenals/Urinary Tract: Negative adrenal glands and kidneys. Symmetric renal enhancement and contrast excretion. Distended urinary bladder (470 mL), but otherwise unremarkable. Occasional pelvic phleboliths. Stomach/Bowel: Mild to moderate diverticulosis of the large bowel from the splenic flexure to the proximal sigmoid. Occasional transverse colon diverticula. Mild diverticulosis of the ascending colon. No active inflammation. Normal appendix on series 2, image 62. Negative terminal ileum. No dilated small bowel. Decompressed stomach and duodenum. Duodenal diverticulum measuring 2.5 cm on series 2, image 29 is chronic with no active inflammation. No free air or free fluid. Vascular/Lymphatic: Major vascular structures in  the abdomen and pelvis appear patent. Minimal aortic calcified atherosclerosis. No lymphadenopathy. Reproductive: Negative. Other: No pelvic free fluid. 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 Lower lumbar facet degeneration. Pubic symphysis degeneration. No acute osseous abnormality identified. IMPRESSION: 1. No convincing CT evidence of pancreatitis,  but CT can be normal in mild acute pancreatitis. 2. Pronounced Fatty Liver Disease now. Suspect focal fatty sparing near the gallbladder fossa. 3. Distended urinary bladder (470 mL).  Query urinary retention. 4. Diverticulosis of the duodenum and large bowel without active inflammation. Normal appendix. Electronically Signed   By: Genevie Ann M.D.   On: 06/06/2021 06:26   DG CHEST PORT 1 VIEW  Result Date: 06/06/2021 CLINICAL DATA:  60 year old female with history of shortness of breath. EXAM: PORTABLE CHEST 1 VIEW COMPARISON:  Chest x-ray 03/11/2018. FINDINGS: Lung volumes are normal. No consolidative airspace disease. No pleural effusions. No pneumothorax. No pulmonary nodule or mass noted. Pulmonary vasculature and the cardiomediastinal silhouette are within normal limits. IMPRESSION: No radiographic evidence of acute cardiopulmonary disease. Electronically Signed   By: Vinnie Langton M.D.   On: 06/06/2021 08:18      LAB RESULTS: Basic Metabolic Panel: Recent Labs  Lab 06/06/21 0256 06/07/21 0501 06/08/21 0533  NA 136 137 137  K 3.6 3.4* 3.0*  CL 102 106 104  CO2 '22 24 24  '$ GLUCOSE 105* 89 79  BUN <5* <5* <5*  CREATININE 0.42* 0.47 0.48  CALCIUM 9.0 8.5* 8.7*  MG 1.8 1.9  --   PHOS 2.9  --   --    Liver Function Tests: Recent Labs  Lab 06/07/21 0501 06/08/21 0533  AST 165* 153*  ALT 76* 69*  ALKPHOS 142* 127*  BILITOT 1.9* 2.5*  PROT 6.5 6.6  ALBUMIN 2.6* 2.5*   Recent Labs  Lab 06/06/21 0256 06/07/21 0501  LIPASE 197* 107*   No results for input(s): AMMONIA in the last 168 hours. CBC: Recent Labs  Lab 06/06/21 0256 06/07/21 0501  WBC 6.7 5.2  NEUTROABS 3.9  --   HGB 12.7 11.4*  HCT 35.1* 32.8*  MCV 95.9 99.4  PLT 90* 72*   Cardiac Enzymes: No results for input(s): CKTOTAL, CKMB, CKMBINDEX, TROPONINI in the last 168 hours. BNP: Invalid input(s): POCBNP CBG: Recent Labs  Lab 06/08/21 1245  GLUCAP 138*       Disposition and Follow-up: Discharge  Instructions     Diet - low sodium heart healthy   Complete by: As directed    Increase activity slowly   Complete by: As directed         DISPOSITION: [place] [facility]. Schedule an appointment as soon as possible for a visit.   Specialty: Obstetrics and Gynecology Why: Please call and make an appointment as soon as possible. Contact information: [address] [place] [phone]        Kerin Perna, NP. Schedule an appointment as soon as possible for a visit in 2 week(s).   Specialty: Internal Medicine Why: for hospital follow-up, obtain labs (metabolic panel and liver function tests) Contact information: [address] [address] [phone]                  Time coordinating discharge:  35 minutes  Signed:   Estill Cotta M.D. [facility] 06/09/2021, 1:25 PM

## 2021-06-09 NOTE — Progress Notes (Signed)
Pt discharged home. AVS printed and educational teaching done with teach back method. "Ask Me 3" utilized. PIV removed. Pt has belongings. Pt transported to lobby via wheelchair. No further questions at this time.

## 2021-06-11 ENCOUNTER — Other Ambulatory Visit: Payer: Self-pay

## 2021-06-11 ENCOUNTER — Telehealth: Payer: Self-pay

## 2021-06-11 NOTE — Telephone Encounter (Signed)
Transition Care Management Follow-up Telephone Call Date of discharge and from where: 06/09/2021, [facility]  How have you been since you were released from the hospital? She explained that she just noticed blood in her urine this morning. She said it only happened once so far, no clots, urine pink tinged.  Any questions or concerns? Yes - noted above.  Items Reviewed: Did the pt receive and understand the discharge instructions provided? Yes  Medications obtained and verified?  She   said she still needs to pick up some medications and she did not have any questions about her med regime.  Other? No  Any new allergies since your discharge? No  Do you have support at home? Yes   Home Care and Equipment/Supplies: Were home health services ordered? no If so, what is the name of the agency? N/a  Has the agency set up a time to come to the patient's home? not applicable Were any new equipment or medical supplies ordered?  No What is the name of the medical supply agency? N/a Were you able to get the supplies/equipment? not applicable Do you have any questions related to the use of the equipment or supplies? No  Functional Questionnaire: (I = Independent and D = Dependent) ADLs: independent  Follow up appointments reviewed:  PCP Hospital f/u appt confirmed? Yes  Scheduled to see Juluis Mire, NP  on 06/12/2021 @ 1110. [facility] f/u appt confirmed? Yes  Scheduled to see GYN  on 06/25/2021.  Are transportation arrangements needed? No  - she said her fiance drives her. Informed her that Medicaid will provide transportation to medical appointments.  If their condition worsens, is the pt aware to call PCP or go to the Emergency Dept.? Yes Was the patient provided with contact information for the PCP's office or ED? Yes Was to pt encouraged to call back with questions or concerns? Yes

## 2021-06-12 ENCOUNTER — Ambulatory Visit (INDEPENDENT_AMBULATORY_CARE_PROVIDER_SITE_OTHER): Payer: Medicaid Other | Admitting: Primary Care

## 2021-06-12 ENCOUNTER — Encounter (INDEPENDENT_AMBULATORY_CARE_PROVIDER_SITE_OTHER): Payer: Self-pay | Admitting: Primary Care

## 2021-06-12 ENCOUNTER — Other Ambulatory Visit: Payer: Self-pay

## 2021-06-12 VITALS — BP 104/75 | HR 63 | Temp 97.3°F | Ht 64.0 in | Wt 160.0 lb

## 2021-06-12 DIAGNOSIS — E782 Mixed hyperlipidemia: Secondary | ICD-10-CM

## 2021-06-12 DIAGNOSIS — Z09 Encounter for follow-up examination after completed treatment for conditions other than malignant neoplasm: Secondary | ICD-10-CM

## 2021-06-12 DIAGNOSIS — Z1211 Encounter for screening for malignant neoplasm of colon: Secondary | ICD-10-CM

## 2021-06-12 DIAGNOSIS — F17210 Nicotine dependence, cigarettes, uncomplicated: Secondary | ICD-10-CM | POA: Diagnosis not present

## 2021-06-12 DIAGNOSIS — F1020 Alcohol dependence, uncomplicated: Secondary | ICD-10-CM | POA: Diagnosis not present

## 2021-06-12 DIAGNOSIS — F17200 Nicotine dependence, unspecified, uncomplicated: Secondary | ICD-10-CM

## 2021-06-12 DIAGNOSIS — R748 Abnormal levels of other serum enzymes: Secondary | ICD-10-CM

## 2021-06-12 NOTE — Progress Notes (Signed)
[facility]   Subjective:   [name] is a 60 y.o. female presents for hospital follow up .  Patient called 911 for abdominal pain ,nausea, vomiting every day with some bright red blood.  She admits to going on a alcohol binge and tells me again this is it. I am not drinking anymore. Questioned why is this different from the previous times you told PCP this -pain in stomach unbearable. Admitted to the hospital on 06/06/21, patient was discharged from the hospital on 06/09/21, patient was admitted for: Alcohol withdrawal syndrome with complication, with unspecified complication ([place]), Abdominal pain, Nausea and vomiting, Hematemesis and Malnutrition of moderate degree. Today she states she feels a lot better and has had no alcohol since hospital discharge. 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  STates this is when she started drinking heaviily   Bipolar 1 disorder ([place])    Seizures ([place])    Trichimoniasis 2010     No Known Allergies    Current Outpatient Medications on File Prior to Visit  Medication Sig Dispense Refill   dextromethorphan-guaiFENesin (MUCINEX DM) 30-600 MG 12hr tablet Take 1 tablet by mouth 2 (two) times daily as needed for cough.     loratadine (CLARITIN) 10 MG tablet Take 1 tablet (10 mg total) by mouth daily. (Patient taking differently: Take 10 mg by mouth daily as needed for allergies.) 30 tablet 11   medroxyPROGESTERone (PROVERA) 10 MG tablet Take 1 tablet (10 mg total) by mouth daily. 30 tablet 0   Multiple Vitamins-Minerals (MULTI FOR HER 50+) TABS Take 1 tablet by mouth daily.     ondansetron (ZOFRAN ODT) 4 MG disintegrating tablet Take 1 tablet (4 mg total) by mouth every 8 (eight) hours as needed for nausea or vomiting. 20 tablet 0   pantoprazole (PROTONIX) 40 MG tablet Take 1 tablet (40 mg total) by mouth daily. 30 tablet 1    risperiDONE (RISPERDAL) 1 MG tablet TAKE 1 TABLET (1 MG TOTAL) BY MOUTH AT BEDTIME. (Patient taking differently: Take 1 mg by mouth at bedtime.) 90 tablet 1   No current facility-administered medications on file prior to visit.     Review of System: Review of Systems  Gastrointestinal:  Positive for abdominal pain and nausea.       Left side last night self resolved  Psychiatric/Behavioral:  Positive for substance abuse.   All other systems reviewed and are negative.  Objective:  BP 104/75 (BP Location: Right Arm, Patient Position: Sitting, Cuff Size: Normal)   Pulse 63   Temp (!) 97.3 F (36.3 C) (Temporal)   Ht 5' 4"  (1.626 m)   Wt 160 lb (72.6 kg)   LMP 03/15/2011   SpO2 99%   BMI 27.46 kg/m   Filed Weights   06/12/21 1118  Weight: 160 lb (72.6 kg)    Physical Exam:   General Appearance: Well nourished, in no apparent distress. Eyes: PERRLA, EOMs, conjunctiva no swelling or erythema Sinuses: No Frontal/maxillary tenderness ENT/Mouth: Ext aud canals clear, TMs without erythema, bulging. No erythema, swelling, or exudate on post pharynx.  Tonsils not swollen or erythematous. Hearing normal.  Neck: Supple, thyroid normal.  Respiratory: Respiratory effort normal, BS equal bilaterally without rales, rhonchi, wheezing or stridor.  Cardio: RRR with no MRGs. Brisk peripheral pulses without edema.  Abdomen: Soft, + BS.  Non tender, no  guarding, rebound, hernias, masses. Lymphatics: Non tender without lymphadenopathy.  Musculoskeletal: Full ROM, 5/5 strength, normal gait.  Skin: Warm, dry without rashes, lesions, ecchymosis.  Neuro: Cranial nerves intact. Normal muscle tone, no cerebellar symptoms. Sensation intact.  Psych: Awake and oriented X 3, normal affect, Insight and Judgment appropriate.   Assessment:  Glennie was seen today for hospitalization follow-up.  Diagnoses and all orders for this visit:  Abnormal liver enzymes In hospital repeat labs per d/c summary   [facility] discharge follow-up Retrieved from hospital  Schedule an appointment with [facility] [place] [facility] (Obstetrics and Gynecology); Please call and make an appointment as soon as possible.referral made (Admitting physician discussed with Dr. Gertie Gowda (OB/GYN ) who recommended outpatient follow-up and start the patient on Provera 10 mg for 30 days.) Schedule an appointment with Kerin Perna, NP (Internal Medicine) in 2 weeks (06/24/2021); for hospital follow-up, obtain labs (metabolic panel and liver function tests)- blood work obtain  Mixed hyperlipidemia Previous lipids wnl in 6/22 hold statin until labs completed. Discontinue and discussed   Healthy lifestyle diet of fruits vegetables fish nuts whole grains and low saturated fat . Foods high in cholesterol or liver, fatty meats,cheese, butter avocados, nuts and seeds, chocolate and fried foods.  Alcoholism ([place]) Alcoholics Anonymous:  [phone]  Tobacco dependence - I have recommended complete cessation of tobacco use. I have discussed various options available for assistance with tobacco cessation including over the counter methods (Nicotine gum, patch and lozenges). We also discussed prescription options (Chantix, Nicotine Inhaler / Nasal Spray). The patient is not interested in pursuing any prescription tobacco cessation options at this time. - Patient declines at this time.   Screening for colon cancer Referred to GI   This note has been created with Surveyor, quantity. Any transcriptional errors are unintentional.   Kerin Perna, NP 06/12/2021, 12:36 PM

## 2021-06-12 NOTE — Patient Instructions (Signed)
Community Resources  Advocacy/Legal Legal Aid [place]:  1-866-219-5262  /  336-272-0148  Family Justice Center:  336-641-7233  [facility] 24-hr Crisis line:  336-273-7273  Women's Resource Center, [place]:  336-275-6090  Court Watch (custody):  336-275-2346  [place] [facility]:   336-279-9299    Baby & Breastfeeding Car Seat Inspection @ Various [place] Fire Depts.- call 336-373-2177  [place] [facility]  336-832-6860  [facility] 336-878-6712  WIC: 336-641-3663 ([place]);  336-641-7571 ([place])  La Leche League:  1-877-452-5321   Childcare [place] [facility]: 336-369-5097 ([place]) / 336-887-8224 ([place])  - Child Care Resources/ Referrals/ Scholarships  - Head Start/ Early Head Start (call or apply online)  [place] DHHS: [place] Pre-K :  1-800-859-0829 / 336-274-5437   Employment / Job Search [facility] [place]: 336-275-6090 / [address]  [place] [employer] (JobLink): 336-373-5922 ([place]) / 336-882-4141 ([place])  [facility]/ Career Center: 336-275-9801 / 336-282-7307  [facility] Job & Career Center: 336-373-3764  DHHS Work First: 336-641-3447 ([place]) / 336-641-3447 ([place])  [facility] [place]:  336-676-5871   Financial Assistance [place] [facility]:  336-553-2657  [employer]: 336-235-0368  [employer] (furniture):  336-370-4002  [place] [facility]: 336-373-4264  Low Income Energy Assistance  336-641-3000   Food Assistance DHHS- SNAP/ Food Stamps: 336-641-4588  WIC: [place]- 336-641-3663 ;  [place] 336-641-7571  Little Green Book- Free Meals  Little Blue Book- Free Food Pantries  During the summer, text "FOOD" to 877877   General Health / Clinics (Adults) Orange Card (for Adults) through [facility]: (336) 895-4900  [facility]:   336-832-8035  [facility]:   336-832-4444  Health Department:  336-641-3245  [facility]:  336-415-3877 / 336-641-2100  Planned [facility] of [place]:   336-373-0678  [facility]:   336-334-4822 x 50251   Housing [place] Housing Coalition:   336-691-9521  [place] Housing Authority:  336-275-8501  Affordable Housing Managemnt:  336-273-0568   Immigrant/ Refugee Center for [place]ians (UNCG):  336-256-1065  Faith [facility]:  336-379-0037  [employer]rivals Institute:  336-937-4701  Church World Services:  336-617-0381  African Services Coalition:  336-574-2677   LGBTQ YouthSAFE  www.youthsafe[place].org  PFLAG  336-541-6754 / info@[place].org  The Trevor Project:  1-866-488-7386   Mental Health/ Substance Use [facility]  336-387-6161  [facility]:  336-832-9700 or 1-800-711-2635  [place] of Care:  336-271-5888  [facility]:  336-294-1349  [facility]:  336-542-2884  [facility] (walk-ins)  336-676-6840 / [address]  [place]:  800-449-1287  Alcoholics Anonymous:  336-854-4278  Narcotics Anonymous:  800-365-1036  Quit Smoking Hotline:  800-QUIT-NOW (800-784-8669)   Parenting Children's Home Society:  800-632-1400  [place]: Education Center & Support Groups:  336-832-6682  YWCA: 336-273-3461  UNCG: Bringing Out the Best:  336-334-3120               Thriving at Three (Hispanic families): 336-256-1066  Healthy Start ([facility]):  336-387-6161 x2288  Parents as Teachers:  336-691-0024  [place] [facility]- Learning Together (Immigrants): 336-369-5001   Poison Control 800-222-1222  Sports & Recreation [employer] Open Doors Application: ymcanwnc.org/join/open-doors-financial-assistance/  City of [place] Recreation Centers: http://www.[place].gov/index.aspx?page=3615   Special Needs Family Support Network:  336-832-6507  Autism Society of [place]:   336-333-0197 x1402 or x1412 /  800-785-1035  TEACCH :  336-334-5773     ARC of [place]:  336-373-1076  Children's Developmental Service Agency (CDSA):  336-334-5601  CC4C (Care Coordination for Children):  336-641-7641   Transportation Medicaid Transportation: 336-641-4848 to apply  [place] Transit Authority: 336-335-6499 (reduced-fare bus ID to Medicaid/ Medicare/ Orange Card)  SCAT Paratransit services: Eligible riders only, call 336-333-6589 for application   Tutoring/Mentoring Black Child Development Institute: 336-230-2138  Big Brothers/ Big Sisters: 336-378-9100 ([place])  336-882-4167 ([place])  ACES through child's school: 336-370-2321  [employer] Achievers: contact your local Y  SHIELD Mentor Program: 336-337-2771   

## 2021-06-13 LAB — CMP14+EGFR
ALT: 57 IU/L — ABNORMAL HIGH (ref 0–32)
AST: 102 IU/L — ABNORMAL HIGH (ref 0–40)
Albumin/Globulin Ratio: 0.8 — ABNORMAL LOW (ref 1.2–2.2)
Albumin: 2.8 g/dL — ABNORMAL LOW (ref 3.8–4.9)
Alkaline Phosphatase: 138 IU/L — ABNORMAL HIGH (ref 44–121)
BUN/Creatinine Ratio: 4 — ABNORMAL LOW (ref 9–23)
BUN: 2 mg/dL — ABNORMAL LOW (ref 6–24)
Bilirubin Total: 1.5 mg/dL — ABNORMAL HIGH (ref 0.0–1.2)
CO2: 23 mmol/L (ref 20–29)
Calcium: 9 mg/dL (ref 8.7–10.2)
Chloride: 102 mmol/L (ref 96–106)
Creatinine, Ser: 0.47 mg/dL — ABNORMAL LOW (ref 0.57–1.00)
Globulin, Total: 3.7 g/dL (ref 1.5–4.5)
Glucose: 89 mg/dL (ref 65–99)
Potassium: 3.1 mmol/L — ABNORMAL LOW (ref 3.5–5.2)
Sodium: 139 mmol/L (ref 134–144)
Total Protein: 6.5 g/dL (ref 6.0–8.5)
eGFR: 110 mL/min/{1.73_m2} (ref >59)

## 2021-06-14 ENCOUNTER — Other Ambulatory Visit (INDEPENDENT_AMBULATORY_CARE_PROVIDER_SITE_OTHER): Payer: Self-pay | Admitting: Primary Care

## 2021-06-14 ENCOUNTER — Other Ambulatory Visit: Payer: Self-pay

## 2021-06-14 DIAGNOSIS — E876 Hypokalemia: Secondary | ICD-10-CM

## 2021-06-14 MED ORDER — POTASSIUM CHLORIDE ER 10 MEQ PO TBCR
10.0000 meq | EXTENDED_RELEASE_TABLET | Freq: Every day | ORAL | 0 refills | Status: DC
  Filled 2021-06-14 – 2021-06-21 (×2): qty 60, 60d supply, fill #0

## 2021-06-15 ENCOUNTER — Telehealth (INDEPENDENT_AMBULATORY_CARE_PROVIDER_SITE_OTHER): Payer: Self-pay

## 2021-06-15 NOTE — Telephone Encounter (Signed)
Patient is aware that labs are normal due to alcohol abuse. Potassium low; supplement sent to pharmacy. Nat Christen, CMA

## 2021-06-15 NOTE — Telephone Encounter (Signed)
-----   Message from Kerin Perna, NP sent at 06/14/2021  4:28 PM EDT ----- Abnormal labs due to alcohol abuse. Liver and kidney function and electrolytes  Potassium is low added a potassium supplement.

## 2021-06-21 ENCOUNTER — Other Ambulatory Visit: Payer: Self-pay

## 2021-06-25 ENCOUNTER — Other Ambulatory Visit: Payer: Self-pay

## 2021-06-25 ENCOUNTER — Encounter: Payer: Self-pay | Admitting: Obstetrics and Gynecology

## 2021-06-25 ENCOUNTER — Ambulatory Visit (INDEPENDENT_AMBULATORY_CARE_PROVIDER_SITE_OTHER): Payer: Medicaid Other | Admitting: Obstetrics and Gynecology

## 2021-06-25 ENCOUNTER — Telehealth: Payer: Self-pay

## 2021-06-25 VITALS — BP 117/81 | HR 76 | Ht 64.0 in | Wt 160.9 lb

## 2021-06-25 DIAGNOSIS — R9389 Abnormal findings on diagnostic imaging of other specified body structures: Secondary | ICD-10-CM | POA: Diagnosis not present

## 2021-06-25 DIAGNOSIS — N898 Other specified noninflammatory disorders of vagina: Secondary | ICD-10-CM

## 2021-06-25 NOTE — Progress Notes (Signed)
Obstetrics and Gynecology Established Patient Evaluation  Appointment Date: 06/25/2021  OBGYN Clinic: [facility]   Primary Care Provider: Kerin Perna  Referring Provider: [facility]  Chief Complaint: GYN follow up  History of Present Illness: Janet Ramos is a 60 y.o. African-American [phone] (Patient's last menstrual period was 03/15/2011.), seen for the above chief complaint.   Patient admitted to the Hospitalist service from 8/24-8/27 for GI s/s. During this admission, she had vaginal itching and the GYN on call was called and she recommended diflucan and also provera.  Pt states she finished the diflucan x 5 doses and is continuing on the provera.  She sates her LMP was 10 years ago and she had an embx and pap in 09/2020 by Dr. Harolyn Rutherford for an asymptomatic thickened endometrial stripe see on CT scan w/u for abdominal pain.  Pap was negative cytology and neg hpv and embx was negative and showed fragments of an endometrial polyp with cystic atrophic glands and it appears plan was for expectant management.  She was previously seen in April 2019 for the same thing, but seen on u/s (92m with multiple cystic areas present, largest 1.2cm, no vascularity), which was done for abdominal pain w/u and she received a pap and embx with negative pap and hpv and neg, benign embx with features suggestive of a polyp; he was put on provera qday and u/s ordered and follow up in august 2019 and she was still asymptomatic and plan was to continue on the provera, although it seemed like she had not been taking the provera that was given 4 months before. She was seen again for follow up in December 2019 and had spotting with the Provera with plan for rpt u/s and to continue the provera and f/u in 3 months. Patient next seen 09/2020 for re-referral for pelvic pain from her PCP; CT scan in the ED in 04/2020 showed, "redemonstration of a prominent and slightly  heterogeneous endometrial stripe measuring up to 11 mm in maximal thickness on today's examination. No concerning adnexal lesion." Patient seen by Dr. AEster Rinkand had no VB or spotting. A pap and embx was done and it showed a negative pap and hpv with benign embx but did show fragments of endometrial polyp with cystic atrophic glands.  It appears that plan was expectant management.   Patient denies any bleeding or spotting  Review of Systems: Pertinent items noted in HPI and remainder of comprehensive ROS otherwise negative.   Patient Active Problem List   Diagnosis Date Noted   Malnutrition of moderate degree 06/07/2021   Abdominal pain 06/06/2021   Vaginal itching 06/06/2021   Nausea and vomiting 06/06/2021   Hematemesis 06/06/2021   Tobacco abuse 06/06/2021   Weight loss 06/06/2021   Alcohol withdrawal syndrome with complication, with unspecified complication ([place] [id number]  Thickened endometrium 02/09/2018   Substance induced mood disorder ([place] 10/31/2017   Left knee pain 08/28/2015   Headache 05/08/2015   Hyperlipidemia 05/08/2015   Hypokalemia 05/08/2015   Right sided weakness 05/07/2015   Syncope 05/07/2015   Polysubstance abuse ([place] 05/07/2015   Rupture of globe 09/16/2014    Past Medical History:  Past Medical History:  Diagnosis Date   Alcoholism ([place] 09/2014   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   Damage from remote that hit her eye   ESOPHAGOGASTRODUODENOSCOPY N/A 06/07/2021   Procedure: ESOPHAGOGASTRODUODENOSCOPY (EGD);  Surgeon: Carol Ada, MD;  Location[facility];  Service: Endoscopy;  Laterality: N/A;  Epigastric pain with nausea vomiting and 40 pound  weight loss   EYE SURGERY Right 06/2015   cyst removal from medial canthus   RUPTURED GLOBE EXPLORATION AND REPAIR Left 09/16/2014   Procedure:  Ruptured Globe Repair Left Eye;  Surgeon: Lamonte Sakai, MD;  Location: [facility];  Service: Ophthalmology;  Laterality: Left;   TUBAL LIGATION     tubaligation  1994    Past Obstetrical History:  OB History  Gravida Para Term Preterm AB Living  '6 6 6     6  '$ SAB IAB Ectopic Multiple Live Births          6    # Outcome Date GA Lbr Len/2nd Weight Sex Delivery Anes PTL Lv  6 Term           5 Term           4 Term           3 Term           2 Term           1 Term            Past Gynecological History: As per HPI. History of HRT use: No.   Social History:  Social History   Socioeconomic History   Marital status: Single    Spouse name: Not on file   Number of children: 6   Years of education: Not on file   Highest education level: Not on file  Occupational History   Occupation: Nurses Aide in past.    Comment: unemployed since eye injury about 1 year ago.  Tobacco Use   Smoking status: Every Day    Packs/day: 0.75    Years: 43.00    Pack years: 32.25    Types: Cigarettes    Start date: 02/24/1976   Smokeless tobacco: Never   Tobacco comments:    Working on it.  Vaping Use   Vaping Use: Never used  Substance and Sexual Activity   Alcohol use: Yes    Alcohol/week: 8.0 standard drinks    Types: 8 Standard drinks or equivalent per week    Comment: daily use   Drug use: Yes    Types: Marijuana   Sexual activity: Not Currently    Birth control/protection: None  Other Topics Concern   Not on file  Social History Narrative   Originally from [place], but moved to [place] at age 61 yo.     Lived with mother in [place].   Younger sister killed there--the man who was thought to be the killer was murdered in front of the patient's home by some of her sister's friends.   Older sister is felt to have been kidnapped by her [place]  husband and take to [place]--occurred 5 years ago.   Middle sister infected with HIV from her husband.    Pt. Lives with a female platonic friend.    Social Determinants of Health   Financial Resource Strain: Not on file  Food Insecurity: Not on file  Transportation Needs: Not on file  Physical Activity: Not on file  Stress: Not on  file  Social Connections: Not on file  Intimate Partner Violence: Not on file    Family History:  Family History  Problem Relation Age of Onset   Lupus Mother    Heart disease Mother    Colon cancer Father 50   Thyroid disease Daughter    Bipolar disorder Daughter    Hypertension Other    Cancer Other    CAD Other     Medications Posey Boyer had no medications administered during this visit. Current Outpatient Medications  Medication Sig Dispense Refill   dextromethorphan-guaiFENesin (MUCINEX DM) 30-600 MG 12hr tablet Take 1 tablet by mouth 2 (two) times daily as needed for cough.     loratadine (CLARITIN) 10 MG tablet Take 1 tablet (10 mg total) by mouth daily. (Patient taking differently: Take 10 mg by mouth daily as needed for allergies.) 30 tablet 11   medroxyPROGESTERone (PROVERA) 10 MG tablet Take 1 tablet (10 mg total) by mouth daily. 30 tablet 0   Multiple Vitamins-Minerals (MULTI FOR HER 50+) TABS Take 1 tablet by mouth daily.     ondansetron (ZOFRAN ODT) 4 MG disintegrating tablet Take 1 tablet (4 mg total) by mouth every 8 (eight) hours as needed for nausea or vomiting. 20 tablet 0   pantoprazole (PROTONIX) 40 MG tablet Take 1 tablet (40 mg total) by mouth daily. 30 tablet 1   potassium chloride (KLOR-CON) 10 MEQ tablet Take 1 tablet (10 mEq total) by mouth daily. 60 tablet 0   risperiDONE (RISPERDAL) 1 MG tablet TAKE 1 TABLET (1 MG TOTAL) BY MOUTH AT BEDTIME. (Patient taking differently: Take 1 mg by mouth at bedtime.) 90 tablet 1   No current facility-administered medications for this visit.    Allergies Patient has no known  allergies.   Physical Exam:  BP 117/81   Pulse 76   Ht '5\' 4"'$  (1.626 m)   Wt 160 lb 14.4 oz (73 kg)   LMP 03/15/2011   BMI 27.62 kg/m  Body mass index is 27.62 kg/m. General appearance: Well nourished, well developed female in no acute distress.  Respiratory:  Normal respiratory effort, CTAB CV: normal s1 and s2, no MRGs Abdomen: no masses, hernias; diffusely non tender to palpation, non distended Neuro/Psych:  Normal mood and affect.  Skin:  Warm and dry.   Laboratory: as per hpi  Radiology: as per hpi  Assessment: pt stable  Plan:  1. Vaginal itching Resolved. I told her I'm not sure why provera was recommended for vaginal itching while she was in the hospital b/c she states she has never had any bleeding; I suspect the GYN on call thought maybe they were calling for PMB or b/c her stripe was thickened in the past.  I told her to stop the provera and if she has a withdrawal bleed then that is normal but should resolve after about a week.   I do recommend a rpt u/s to reassess her stripe  RTC after u/s.   Durene Romans MD Attending Center for [employer] Fish farm manager)

## 2021-06-25 NOTE — Telephone Encounter (Signed)
Called Pt to advice of U/S appt.at [facility] on 06/29/21 @ 2:15. Pt verbalized understanding.

## 2021-06-29 ENCOUNTER — Other Ambulatory Visit: Payer: Self-pay

## 2021-06-29 ENCOUNTER — Ambulatory Visit (HOSPITAL_COMMUNITY)
Admission: RE | Admit: 2021-06-29 | Discharge: 2021-06-29 | Disposition: A | Discharge status: Home / Self Care | Payer: Medicaid Other | Source: Ambulatory Visit | Attending: Obstetrics and Gynecology | Admitting: Obstetrics and Gynecology

## 2021-06-29 DIAGNOSIS — R9389 Abnormal findings on diagnostic imaging of other specified body structures: Secondary | ICD-10-CM | POA: Diagnosis present

## 2021-07-09 ENCOUNTER — Other Ambulatory Visit: Payer: Self-pay

## 2021-07-25 ENCOUNTER — Telehealth (INDEPENDENT_AMBULATORY_CARE_PROVIDER_SITE_OTHER): Payer: Self-pay

## 2021-07-25 NOTE — Telephone Encounter (Signed)
Copied from [place] [id number]. Topic: General - Other >> Jul 25, 2021 11:19 AM Janet Ramos wrote: Reason for CRM: Pt had a procedure done at [facility] care a month ago /pt has not heard anything about her ultrasound results/ please advise asap

## 2021-07-26 ENCOUNTER — Telehealth: Payer: Self-pay

## 2021-07-26 NOTE — Telephone Encounter (Signed)
Call received on nurse line asking about PUS results from 06/25/21.  Colletta Maryland, RN

## 2021-07-26 NOTE — Telephone Encounter (Signed)
Call placed to pt. Per reviewed notes on [place], Dr Ilda Basset wants to see pt for results. Pt advised has sent Dr Ilda Basset a message to ask when to schedule since schedule is full.  Colletta Maryland, RN

## 2021-07-31 ENCOUNTER — Telehealth: Payer: Self-pay | Admitting: Radiology

## 2021-07-31 NOTE — Telephone Encounter (Signed)
Spoke with patient to inform her of appointment scheduled 08/08/21 @ 1:15 with Dr Ilda Basset

## 2021-08-06 NOTE — Telephone Encounter (Signed)
Pt has scheduled appt with Dr Ilda Basset on 08/08/21. Colletta Maryland, RN

## 2021-08-08 ENCOUNTER — Other Ambulatory Visit: Payer: Self-pay

## 2021-08-08 ENCOUNTER — Ambulatory Visit (INDEPENDENT_AMBULATORY_CARE_PROVIDER_SITE_OTHER): Payer: Medicaid Other | Admitting: Obstetrics and Gynecology

## 2021-08-08 VITALS — BP 130/98 | HR 86 | Ht 64.0 in | Wt 161.4 lb

## 2021-08-08 DIAGNOSIS — R9389 Abnormal findings on diagnostic imaging of other specified body structures: Secondary | ICD-10-CM | POA: Diagnosis not present

## 2021-08-08 NOTE — Progress Notes (Signed)
Obstetrics and Gynecology Return Patient Evaluation  Appointment Date: 08/08/2021  OBGYN Clinic: [facility]  Primary Care Provider: Kerin Perna  Chief Complaint: ultrasound follow up  History of Present Illness: Janet Ramos is a 60 y.o. African-American [phone] (Patient's last menstrual period was 03/15/2011.), seen for the above chief complaint.   Patient previously seen by me on 06/25/21: Patient admitted to the Hospitalist service from 8/24-8/27 for GI s/s. During this admission, she had vaginal itching and the GYN on call was called and she recommended diflucan and also provera.  Pt states she finished the diflucan x 5 doses and is continuing on the provera.   She sates her LMP was 10 years ago and she had an embx and pap in 09/2020 by Dr. Harolyn Rutherford for an asymptomatic thickened endometrial stripe see on CT scan w/u for abdominal pain.  Pap was negative cytology and neg hpv and embx was negative and showed fragments of an endometrial polyp with cystic atrophic glands and it appears plan was for expectant management.  She was previously seen in April 2019 for the same thing, but seen on u/s (87mm with multiple cystic areas present, largest 1.2cm, no vascularity), which was done for abdominal pain w/u and she received a pap and embx with negative pap and hpv and neg, benign embx with features suggestive of a polyp; he was put on provera qday and u/s ordered and follow up in august 2019 and she was still asymptomatic and plan was to continue on the provera, although it seemed like she had not been taking the provera that was given 4 months before. She was seen again for follow up in December 2019 and had spotting with the Provera with plan for rpt u/s and to continue the provera and f/u in 3 months. Patient next seen 09/2020 for re-referral for pelvic pain from her PCP; CT scan in the ED in 04/2020 showed, "redemonstration of a prominent and  slightly heterogeneous endometrial stripe measuring up to 11 mm in maximal thickness on today's examination. No concerning adnexal lesion." Patient seen by Dr. Ester Rink and had no VB or spotting. A pap and embx was done and it showed a negative pap and hpv with benign embx but did show fragments of endometrial polyp with cystic atrophic glands.  It appears that plan was expectant management.    Patient denied any bleeding or spotting  Interval History: at that visit, I recommended stopping the provera and getting another u/s to assess her endometrial stripe. 9/16 u/s showed persistent endometrial thickening at 40mm with internal microcystic changes  Patient denies any VB or spotting or any after coming off the provera   Review of Systems: Pertinent items noted in HPI and remainder of comprehensive ROS otherwise negative.    Patient Active Problem List   Diagnosis Date Noted   Malnutrition of moderate degree 06/07/2021   Abdominal pain 06/06/2021   Vaginal itching 06/06/2021   Nausea and vomiting 06/06/2021   Hematemesis 06/06/2021   Tobacco abuse 06/06/2021   Weight loss 06/06/2021   Alcohol withdrawal syndrome with complication, with unspecified complication ([place]) 05/27/4817   Thickened endometrium 02/09/2018   Substance induced mood disorder ([place]) 10/31/2017   Left knee pain 08/28/2015   Headache 05/08/2015   Hyperlipidemia 05/08/2015   Hypokalemia 05/08/2015   Right sided weakness 05/07/2015   Syncope 05/07/2015   Polysubstance abuse ([place]) 05/07/2015   Rupture of globe 09/16/2014    Past Medical History:  Past Medical  History:  Diagnosis Date   Alcoholism ([place]) 09/2014   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  STates this is when she started drinking heaviily   Bipolar 1 disorder ([place])    Seizures ([place])    Trichimoniasis 2010    Past Surgical History:  Past Surgical History:  Procedure Laterality Date   CERVICAL BIOPSY     CESAREAN SECTION  1990    ENUCLEATION Left 09/2014   Damage from remote that hit her eye   ESOPHAGOGASTRODUODENOSCOPY N/A 06/07/2021   Procedure: ESOPHAGOGASTRODUODENOSCOPY (EGD);  Surgeon: Carol Ada, MD;  Location: [facility];  Service: Endoscopy;  Laterality: N/A;  Epigastric pain with nausea vomiting and 40 pound weight loss   EYE SURGERY Right 06/2015   cyst removal from medial canthus   RUPTURED GLOBE EXPLORATION AND REPAIR Left 09/16/2014   Procedure:  Ruptured Globe Repair Left Eye;  Surgeon: Lamonte Sakai, MD;  Location: [facility];  Service: Ophthalmology;  Laterality: Left;   TUBAL LIGATION     tubaligation  1994    Past Obstetrical History:  OB History  Gravida Para Term Preterm AB Living  6 6 6     6   SAB IAB Ectopic Multiple Live Births          6    # Outcome Date GA Lbr Len/2nd Weight Sex Delivery Anes PTL Lv  6 Term           5 Term           4 Term           3 Term           2 Term           1 Term             Social History:  Social History   Socioeconomic History   Marital status: Single    Spouse name: Not on file   Number of children: 6   Years of education: Not on file   Highest education level: Not on file  Occupational History   Occupation: Nurses Aide in past.    Comment: unemployed since eye injury about 1 year ago.  Tobacco Use   Smoking status: Every Day    Packs/day: 0.75    Years: 43.00    Pack years: 32.25    Types: Cigarettes    Start date: 02/24/1976   Smokeless tobacco: Never   Tobacco comments:    Working on it.  Vaping Use   Vaping Use: Never used  Substance and Sexual Activity   Alcohol use: Yes    Alcohol/week: 8.0 standard drinks    Types: 8 Standard drinks or equivalent per week    Comment: daily use   Drug use: Yes    Types: Marijuana   Sexual activity: Not Currently    Birth control/protection: None  Other Topics Concern   Not on file  Social History Narrative   Originally from [place], but moved to [place] at age 59 yo.     Lived with  mother in [place].   Younger sister killed there--the man who was thought to be the killer was murdered in front of the patient's home by some of her sister's friends.   Older sister is felt to have been kidnapped by her [place] husband and take to [place]--occurred 5 years ago.   Middle sister infected with HIV from her husband.    Pt. Lives with a female  platonic friend.    Social Determinants of Radio broadcast assistant Strain: Not on file  Food Insecurity: Food Insecurity Present   Worried About Charity fundraiser in the Last Year: Never true   Arboriculturist in the Last Year: Sometimes true  Transportation Needs: Unmet Transportation Needs   Lack of Transportation (Medical): Yes   Lack of Transportation (Non-Medical): Yes  Physical Activity: Not on file  Stress: Not on file  Social Connections: Not on file  Intimate Partner Violence: Not on file    Family History:  Family History  Problem Relation Age of Onset   Lupus Mother    Heart disease Mother    Colon cancer Father 78   Thyroid disease Daughter    Bipolar disorder Daughter    Hypertension Other    Cancer Other    CAD Other    Medications Posey Boyer had no medications administered during this visit. Current Outpatient Medications  Medication Sig Dispense Refill   Multiple Vitamins-Minerals (MULTI FOR HER 50+) TABS Take 1 tablet by mouth daily.     potassium chloride (KLOR-CON) 10 MEQ tablet Take 1 tablet (10 mEq total) by mouth daily. 60 tablet 0   dextromethorphan-guaiFENesin (MUCINEX DM) 30-600 MG 12hr tablet Take 1 tablet by mouth 2 (two) times daily as needed for cough. (Patient not taking: Reported on 08/08/2021)     loratadine (CLARITIN) 10 MG tablet Take 1 tablet (10 mg total) by mouth daily. (Patient not taking: Reported on 08/08/2021) 30 tablet 11   ondansetron (ZOFRAN ODT) 4 MG disintegrating tablet Take 1 tablet (4 mg total) by mouth every 8 (eight) hours as needed for nausea or vomiting.  (Patient not taking: Reported on 08/08/2021) 20 tablet 0   risperiDONE (RISPERDAL) 1 MG tablet TAKE 1 TABLET (1 MG TOTAL) BY MOUTH AT BEDTIME. (Patient not taking: Reported on 08/08/2021) 90 tablet 1   No current facility-administered medications for this visit.    Allergies Patient has no known allergies.   Physical Exam:  BP (!) 130/98   Pulse 86   Ht 5\' 4"  (1.626 m)   Wt 161 lb 6.4 oz (73.2 kg)   LMP 03/15/2011   BMI 27.70 kg/m  Body mass index is 27.7 kg/m. General appearance: Well nourished, well developed female in no acute distress.  Neck:  Supple, normal appearance, and no thyromegaly  Cardiovascular: normal s1 and s2.  No murmurs, rubs or gallops. Respiratory:  Clear to auscultation bilateral. Normal respiratory effort Abdomen: positive bowel sounds and no masses, hernias; diffusely non tender to palpation, non distended Neuro/Psych:  Normal mood and affect.  Skin:  Warm and dry.   Laboratory: no new labs  Radiology:  Narrative & Impression  CLINICAL DATA:  Follow-up evaluation of endometrial lining.   EXAM: ULTRASOUND PELVIS TRANSVAGINAL   TECHNIQUE: Transvaginal ultrasound examination of the pelvis was performed including evaluation of the uterus, ovaries, adnexal regions, and pelvic cul-de-sac.   COMPARISON:  Prior ultrasound from 12/21/2018.   FINDINGS: Uterus   Measurements: 5.9 x 3.1 x 3.9 cm = volume: 37.1 mL. Uterus is anteverted. No discrete fibroid or other mass.   Endometrium   Thickness: 10 mm. Endometrial complex demonstrates a heterogeneous echotexture with internal microcystic change. No superimposed focal lesion.   Right ovary   Not visualized.  No adnexal mass.   Left ovary   Not visualized.  No adnexal mass.   Other findings:  No abnormal free fluid   IMPRESSION: 1. Endometrial  stripe thickened up to 10 mm with internal microcystic change, previously 25 mm on 12/21/2018. Again, finding is suspicious for possible  endometrial hyperplasia, although endometrial carcinoma could also have this appearance. Endometrial sampling should be considered to exclude carcinoma if not already performed. 2. Nonvisualization of either ovary.  No adnexal mass or free fluid.     Electronically Signed   By: Jeannine Boga M.D.   On: 06/30/2021 18:24    Assessment: pt stable  Plan: I told her that given her u/s findings that I recommend a hysteroscopy, d&c, due to persistence of thickening and that the lining shows some irregularities. H/s and d&c will given a chance to fully evaluate the cavity and also to curettage it and eliminate the thickening. she is amenable to this. Will d/w her PCP to see about need for medical clearance prior to outpatient procedure  RTC PRN  Durene Romans MD Attending Center for [facility])

## 2021-08-09 NOTE — H&P (Signed)
Obstetrics and Gynecology H&P  Appointment Date: 08/08/2021  OBGYN Clinic: [facility]  Primary Care Provider: Kerin Perna  Chief Complaint: ultrasound follow up  History of Present Illness: Janet Ramos is a 60 y.o. African-American [phone] (Patient's last menstrual period was 03/15/2011.), seen for the above chief complaint.   Patient previously seen by me on 06/25/21: Patient admitted to the Hospitalist service from 8/24-8/27 for GI s/s. During this admission, she had vaginal itching and the GYN on call was called and she recommended diflucan and also provera.  Pt states she finished the diflucan x 5 doses and is continuing on the provera.   She sates her LMP was 10 years ago and she had an embx and pap in 09/2020 by Dr. Harolyn Rutherford for an asymptomatic thickened endometrial stripe see on CT scan w/u for abdominal pain.  Pap was negative cytology and neg hpv and embx was negative and showed fragments of an endometrial polyp with cystic atrophic glands and it appears plan was for expectant management.  She was previously seen in April 2019 for the same thing, but seen on u/s (64mm with multiple cystic areas present, largest 1.2cm, no vascularity), which was done for abdominal pain w/u and she received a pap and embx with negative pap and hpv and neg, benign embx with features suggestive of a polyp; he was put on provera qday and u/s ordered and follow up in august 2019 and she was still asymptomatic and plan was to continue on the provera, although it seemed like she had not been taking the provera that was given 4 months before. She was seen again for follow up in December 2019 and had spotting with the Provera with plan for rpt u/s and to continue the provera and f/u in 3 months. Patient next seen 09/2020 for re-referral for pelvic pain from her PCP; CT scan in the ED in 04/2020 showed, "redemonstration of a prominent and slightly heterogeneous  endometrial stripe measuring up to 11 mm in maximal thickness on today's examination. No concerning adnexal lesion." Patient seen by Dr. Ester Rink and had no VB or spotting. A pap and embx was done and it showed a negative pap and hpv with benign embx but did show fragments of endometrial polyp with cystic atrophic glands.  It appears that plan was expectant management.    Patient denied any bleeding or spotting  Interval History: at that visit, I recommended stopping the provera and getting another u/s to assess her endometrial stripe. 9/16 u/s showed persistent endometrial thickening at 48mm with internal microcystic changes  Patient denies any VB or spotting or any after coming off the provera   Review of Systems: Pertinent items noted in HPI and remainder of comprehensive ROS otherwise negative.    Patient Active Problem List   Diagnosis Date Noted   Malnutrition of moderate degree 06/07/2021   Abdominal pain 06/06/2021   Vaginal itching 06/06/2021   Nausea and vomiting 06/06/2021   Hematemesis 06/06/2021   Tobacco abuse 06/06/2021   Weight loss 06/06/2021   Alcohol withdrawal syndrome with complication, with unspecified complication ([place]) 54/65/0354   Thickened endometrium 02/09/2018   Substance induced mood disorder ([place]) 10/31/2017   Left knee pain 08/28/2015   Headache 05/08/2015   Hyperlipidemia 05/08/2015   Hypokalemia 05/08/2015   Right sided weakness 05/07/2015   Syncope 05/07/2015   Polysubstance abuse ([place]) 05/07/2015   Rupture of globe 09/16/2014    Past Medical History:  Past Medical History:  Diagnosis Date   Alcoholism ([place]) 09/2014   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  STates this is when she started drinking heaviily   Bipolar 1 disorder ([place])    Seizures ([place])    Trichimoniasis 2010    Past Surgical History:  Past Surgical History:  Procedure Laterality Date   CERVICAL BIOPSY     CESAREAN SECTION  1990   ENUCLEATION Left  09/2014   Damage from remote that hit her eye   ESOPHAGOGASTRODUODENOSCOPY N/A 06/07/2021   Procedure: ESOPHAGOGASTRODUODENOSCOPY (EGD);  Surgeon: Carol Ada, MD;  Location: [facility];  Service: Endoscopy;  Laterality: N/A;  Epigastric pain with nausea vomiting and 40 pound weight loss   EYE SURGERY Right 06/2015   cyst removal from medial canthus   RUPTURED GLOBE EXPLORATION AND REPAIR Left 09/16/2014   Procedure:  Ruptured Globe Repair Left Eye;  Surgeon: Lamonte Sakai, MD;  Location: [facility];  Service: Ophthalmology;  Laterality: Left;   TUBAL LIGATION     tubaligation  1994    Past Obstetrical History:  OB History  Gravida Para Term Preterm AB Living  6 6 6     6   SAB IAB Ectopic Multiple Live Births          6    # Outcome Date GA Lbr Len/2nd Weight Sex Delivery Anes PTL Lv  6 Term           5 Term           4 Term           3 Term           2 Term           1 Term             Social History:  Social History   Socioeconomic History   Marital status: Single    Spouse name: Not on file   Number of children: 6   Years of education: Not on file   Highest education level: Not on file  Occupational History   Occupation: Nurses Aide in past.    Comment: unemployed since eye injury about 1 year ago.  Tobacco Use   Smoking status: Every Day    Packs/day: 0.75    Years: 43.00    Pack years: 32.25    Types: Cigarettes    Start date: 02/24/1976   Smokeless tobacco: Never   Tobacco comments:    Working on it.  Vaping Use   Vaping Use: Never used  Substance and Sexual Activity   Alcohol use: Yes    Alcohol/week: 8.0 standard drinks    Types: 8 Standard drinks or equivalent per week    Comment: daily use   Drug use: Yes    Types: Marijuana   Sexual activity: Not Currently    Birth control/protection: None  Other Topics Concern   Not on file  Social History Narrative   Originally from [place], but moved to [place] at age 53 yo.     Lived with mother in [place].    Younger sister killed there--the man who was thought to be the killer was murdered in front of the patient's home by some of her sister's friends.   Older sister is felt to have been kidnapped by her [place] husband and take to [place]--occurred 5 years ago.   Middle sister infected with HIV from her husband.    Pt. Lives with a female platonic friend.  Social Determinants of Radio broadcast assistant Strain: Not on file  Food Insecurity: Food Insecurity Present   Worried About Charity fundraiser in the Last Year: Never true   Arboriculturist in the Last Year: Sometimes true  Transportation Needs: Unmet Transportation Needs   Lack of Transportation (Medical): Yes   Lack of Transportation (Non-Medical): Yes  Physical Activity: Not on file  Stress: Not on file  Social Connections: Not on file  Intimate Partner Violence: Not on file    Family History:  Family History  Problem Relation Age of Onset   Lupus Mother    Heart disease Mother    Colon cancer Father 55   Thyroid disease Daughter    Bipolar disorder Daughter    Hypertension Other    Cancer Other    CAD Other     Medications Posey Boyer had no medications administered during this visit. Current Outpatient Medications  Medication Sig Dispense Refill   Multiple Vitamins-Minerals (MULTI FOR HER 50+) TABS Take 1 tablet by mouth daily.     potassium chloride (KLOR-CON) 10 MEQ tablet Take 1 tablet (10 mEq total) by mouth daily. 60 tablet 0   dextromethorphan-guaiFENesin (MUCINEX DM) 30-600 MG 12hr tablet Take 1 tablet by mouth 2 (two) times daily as needed for cough. (Patient not taking: Reported on 08/08/2021)     loratadine (CLARITIN) 10 MG tablet Take 1 tablet (10 mg total) by mouth daily. (Patient not taking: Reported on 08/08/2021) 30 tablet 11   ondansetron (ZOFRAN ODT) 4 MG disintegrating tablet Take 1 tablet (4 mg total) by mouth every 8 (eight) hours as needed for nausea or vomiting. (Patient not taking:  Reported on 08/08/2021) 20 tablet 0   risperiDONE (RISPERDAL) 1 MG tablet TAKE 1 TABLET (1 MG TOTAL) BY MOUTH AT BEDTIME. (Patient not taking: Reported on 08/08/2021) 90 tablet 1   No current facility-administered medications for this visit.    Allergies Patient has no known allergies.   Physical Exam:  BP (!) 130/98   Pulse 86   Ht 5\' 4"  (1.626 m)   Wt 161 lb 6.4 oz (73.2 kg)   LMP 03/15/2011   BMI 27.70 kg/m  Body mass index is 27.7 kg/m. General appearance: Well nourished, well developed female in no acute distress.  Neck:  Supple, normal appearance, and no thyromegaly  Cardiovascular: normal s1 and s2.  No murmurs, rubs or gallops. Respiratory:  Clear to auscultation bilateral. Normal respiratory effort Abdomen: positive bowel sounds and no masses, hernias; diffusely non tender to palpation, non distended Neuro/Psych:  Normal mood and affect.  Skin:  Warm and dry.   Laboratory: no new labs  Radiology:  Narrative & Impression  CLINICAL DATA:  Follow-up evaluation of endometrial lining.   EXAM: ULTRASOUND PELVIS TRANSVAGINAL   TECHNIQUE: Transvaginal ultrasound examination of the pelvis was performed including evaluation of the uterus, ovaries, adnexal regions, and pelvic cul-de-sac.   COMPARISON:  Prior ultrasound from 12/21/2018.   FINDINGS: Uterus   Measurements: 5.9 x 3.1 x 3.9 cm = volume: 37.1 mL. Uterus is anteverted. No discrete fibroid or other mass.   Endometrium   Thickness: 10 mm. Endometrial complex demonstrates a heterogeneous echotexture with internal microcystic change. No superimposed focal lesion.   Right ovary   Not visualized.  No adnexal mass.   Left ovary   Not visualized.  No adnexal mass.   Other findings:  No abnormal free fluid   IMPRESSION: 1. Endometrial stripe thickened up to  10 mm with internal microcystic change, previously 25 mm on 12/21/2018. Again, finding is suspicious for possible endometrial hyperplasia,  although endometrial carcinoma could also have this appearance. Endometrial sampling should be considered to exclude carcinoma if not already performed. 2. Nonvisualization of either ovary.  No adnexal mass or free fluid.     Electronically Signed   By: Jeannine Boga M.D.   On: 06/30/2021 18:24    Assessment: pt stable  Plan: I told her that given her u/s findings that I recommend a hysteroscopy, d&c, due to persistence of thickening and that the lining shows some irregularities. H/s and d&c will given a chance to fully evaluate the cavity and also to curettage it and eliminate the thickening. she is amenable to this. Will d/w her PCP to see about need for medical clearance prior to outpatient procedure  RTC PRN  Durene Romans MD Attending Center for [facility])

## 2021-09-10 ENCOUNTER — Other Ambulatory Visit: Payer: Self-pay

## 2021-09-10 ENCOUNTER — Telehealth: Payer: Self-pay

## 2021-09-10 ENCOUNTER — Other Ambulatory Visit (INDEPENDENT_AMBULATORY_CARE_PROVIDER_SITE_OTHER): Payer: Self-pay | Admitting: Primary Care

## 2021-09-10 DIAGNOSIS — E876 Hypokalemia: Secondary | ICD-10-CM

## 2021-09-10 DIAGNOSIS — Z760 Encounter for issue of repeat prescription: Secondary | ICD-10-CM

## 2021-09-10 NOTE — Telephone Encounter (Signed)
Sent to PCP to refill.

## 2021-09-10 NOTE — Telephone Encounter (Signed)
Called patient to discuss potential surgery dates, no answer, left voicemail and informed patient I would schedule her on first available and if that date (12/28) not work, please give the office a call back at [phone].

## 2021-09-12 ENCOUNTER — Other Ambulatory Visit: Payer: Self-pay

## 2021-09-12 ENCOUNTER — Encounter (INDEPENDENT_AMBULATORY_CARE_PROVIDER_SITE_OTHER): Payer: Self-pay | Admitting: Primary Care

## 2021-09-12 ENCOUNTER — Ambulatory Visit (INDEPENDENT_AMBULATORY_CARE_PROVIDER_SITE_OTHER): Payer: Medicaid Other | Admitting: Primary Care

## 2021-09-12 VITALS — BP 133/89 | HR 77 | Temp 97.9°F | Ht 61.0 in | Wt 166.6 lb

## 2021-09-12 DIAGNOSIS — Z01411 Encounter for gynecological examination (general) (routine) with abnormal findings: Secondary | ICD-10-CM | POA: Diagnosis not present

## 2021-09-12 DIAGNOSIS — Z1211 Encounter for screening for malignant neoplasm of colon: Secondary | ICD-10-CM

## 2021-09-12 DIAGNOSIS — Z23 Encounter for immunization: Secondary | ICD-10-CM | POA: Diagnosis not present

## 2021-09-12 NOTE — Patient Instructions (Signed)
Influenza, Adult °Influenza is also called "the flu." It is an infection in the lungs, nose, and throat (respiratory tract). It spreads easily from person to person (is contagious). The flu causes symptoms that are like a cold, along with high fever and body aches. °What are the causes? °This condition is caused by the influenza virus. You can get the virus by: °Breathing in droplets that are in the air after a person infected with the flu coughed or sneezed. °Touching something that has the virus on it and then touching your mouth, nose, or eyes. °What increases the risk? °Certain things may make you more likely to get the flu. These include: °Not washing your hands often. °Having close contact with many people during cold and flu season. °Touching your mouth, eyes, or nose without first washing your hands. °Not getting a flu shot every year. °You may have a higher risk for the flu, and serious problems, such as a lung infection (pneumonia), if you: °Are older than 65. °Are pregnant. °Have a weakened disease-fighting system (immune system) because of a disease or because you are taking certain medicines. °Have a long-term (chronic) condition, such as: °Heart, kidney, or lung disease. °Diabetes. °Asthma. °Have a liver disorder. °Are very overweight (morbidly obese). °Have anemia. °What are the signs or symptoms? °Symptoms usually begin suddenly and last 4-14 days. They may include: °Fever and chills. °Headaches, body aches, or muscle aches. °Sore throat. °Cough. °Runny or stuffy (congested) nose. °Feeling discomfort in your chest. °Not wanting to eat as much as normal. °Feeling weak or tired. °Feeling dizzy. °Feeling sick to your stomach or throwing up. °How is this treated? °If the flu is found early, you can be treated with antiviral medicine. This can help to reduce how bad the illness is and how long it lasts. This may be given by mouth or through an IV tube. °Taking care of yourself at home can help your  symptoms get better. Your doctor may want you to: °Take over-the-counter medicines. °Drink plenty of fluids. °The flu often goes away on its own. If you have very bad symptoms or other problems, you may be treated in a hospital. °Follow these instructions at home: °  °Activity °Rest as needed. Get plenty of sleep. °Stay home from work or school as told by your doctor. °Do not leave home until you do not have a fever for 24 hours without taking medicine. °Leave home only to go to your doctor. °Eating and drinking °Take an ORS (oral rehydration solution). This is a drink that is sold at pharmacies and stores. °Drink enough fluid to keep your pee pale yellow. °Drink clear fluids in small amounts as you are able. Clear fluids include: °Water. °Ice chips. °Fruit juice mixed with water. °Low-calorie sports drinks. °Eat bland foods that are easy to digest. Eat small amounts as you are able. These foods include: °Bananas. °Applesauce. °Rice. °Lean meats. °Toast. °Crackers. °Do not eat or drink: °Fluids that have a lot of sugar or caffeine. °Alcohol. °Spicy or fatty foods. °General instructions °Take over-the-counter and prescription medicines only as told by your doctor. °Use a cool mist humidifier to add moisture to the air in your home. This can make it easier for you to breathe. °When using a cool mist humidifier, clean it daily. Empty water and replace with clean water. °Cover your mouth and nose when you cough or sneeze. °Wash your hands with soap and water often and for at least 20 seconds. This is also important after   you cough or sneeze. If you cannot use soap and water, use alcohol-based hand sanitizer. °Keep all follow-up visits. °How is this prevented? ° °Get a flu shot every year. You may get the flu shot in late summer, fall, or winter. Ask your doctor when you should get your flu shot. °Avoid contact with people who are sick during fall and winter. This is cold and flu season. °Contact a doctor if: °You get  new symptoms. °You have: °Chest pain. °Watery poop (diarrhea). °A fever. °Your cough gets worse. °You start to have more mucus. °You feel sick to your stomach. °You throw up. °Get help right away if you: °Have shortness of breath. °Have trouble breathing. °Have skin or nails that turn a bluish color. °Have very bad pain or stiffness in your neck. °Get a sudden headache. °Get sudden pain in your face or ear. °Cannot eat or drink without throwing up. °These symptoms may represent a serious problem that is an emergency. Get medical help right away. Call your local emergency services (911 in the [place] °Do not wait to see if the symptoms will go away. °Do not drive yourself to the hospital. °Summary °Influenza is also called "the flu." It is an infection in the lungs, nose, and throat. It spreads easily from person to person. °Take over-the-counter and prescription medicines only as told by your doctor. °Getting a flu shot every year is the best way to not get the flu. °This information is not intended to replace advice given to you by your health care provider. Make sure you discuss any questions you have with your health care provider. °Document Revised: 05/19/2020 Document Reviewed: 05/19/2020 °Elsevier Patient Education © 2022 [employer] ° °

## 2021-09-13 ENCOUNTER — Other Ambulatory Visit: Payer: Self-pay

## 2021-09-14 ENCOUNTER — Other Ambulatory Visit: Payer: Self-pay

## 2021-09-16 NOTE — Progress Notes (Signed)
  [place] PHYSICAL  Patient name: Jazzmyne Rasnick MRN [id number]  Date of birth: [birth year] Chief Complaint:   Hypertension  History of Present Illness:   Aaima Gaddie is a 60 y.o. [phone] female being seen today for a routine well-woman exam.   The current method of family planning is none.  Patient's last menstrual period was 03/15/2011. Last pap 12/23/17.  Last mammogram: ordered 5/22 .  Last colonoscopy: none. Results were: Marland Kitchen Family h/o colorectal cancer: No  Review of Systems:    Denies any headaches, blurred vision, fatigue, shortness of breath, chest pain, abdominal pain, abnormal vaginal discharge/itching/odor/irritation, problems with periods, bowel movements, urination, or intercourse unless otherwise stated above.  Pertinent History Reviewed:   Reviewed past medical,surgical, social and family history.  Reviewed problem list, medications and allergies.  Physical Assessment:   Vitals:   09/12/21 1618  BP: 133/89  Pulse: 77  Temp: 97.9 F (36.6 C)  TempSrc: Temporal  SpO2: 95%  Weight: 166 lb 9.6 oz (75.6 kg)  Height: $Remove'5\' 1"'GNPlNhI$  (1.549 m)  Body mass index is 31.48 kg/m.        Physical Examination:  General appearance - well appearing, and in no distress Mental status - alert, oriented to person, place, and time Psych:  She has a normal mood and affect Skin - warm and dry, normal color, no suspicious lesions noted Chest - effort normal, all lung fields clear to auscultation bilaterally Heart - normal rate and regular rhythm Neck:  midline trachea, no thyromegaly or nodules Extremities:  No swelling or varicosities noted  No results found for this or any previous visit (from the past 24 hour(s)).   Assessment & Plan:  Ericia was seen today for hypertension.  Diagnoses and all orders for this visit:  Need for immunization against influenza -     Flu Vaccine QUAD 62mo+IM (Fluarix, Fluzone & Alfiuria Quad PF)  Screening  for colon cancer -     Cologuard  Surgical Clearance -     Lipid Panel; Future -     EKG 12-Lead -     CMP14+EGFR; Future -     CBC with Differential/Platelet; Future   Labs/procedures today:   Orders Placed This Encounter  Procedures   Flu Vaccine QUAD 62mo+IM (Fluarix, Fluzone & Alfiuria Quad PF)   Cologuard   Lipid Panel   CMP14+EGFR   CBC with Differential/Platelet   EKG 12-Lead    Meds: No orders of the defined types were placed in this encounter.   Follow-up: Return for after surgery .  This note has been created with Surveyor, quantity. Any transcriptional errors are unintentional.   Kerin Perna, NP 09/16/2021, 10:37 PM

## 2021-09-17 ENCOUNTER — Other Ambulatory Visit (INDEPENDENT_AMBULATORY_CARE_PROVIDER_SITE_OTHER): Payer: Self-pay | Admitting: Primary Care

## 2021-09-17 ENCOUNTER — Other Ambulatory Visit: Payer: Self-pay

## 2021-09-17 ENCOUNTER — Other Ambulatory Visit (INDEPENDENT_AMBULATORY_CARE_PROVIDER_SITE_OTHER): Payer: Medicaid Other

## 2021-09-17 DIAGNOSIS — Z01411 Encounter for gynecological examination (general) (routine) with abnormal findings: Secondary | ICD-10-CM

## 2021-09-17 DIAGNOSIS — Z760 Encounter for issue of repeat prescription: Secondary | ICD-10-CM

## 2021-09-17 MED ORDER — RISPERIDONE 1 MG PO TABS
ORAL_TABLET | Freq: Every day | ORAL | 1 refills | Status: DC
  Filled 2021-09-17 – 2021-12-04 (×2): qty 30, 30d supply, fill #0
  Filled 2022-01-14: qty 30, 30d supply, fill #1
  Filled 2022-02-11: qty 30, 30d supply, fill #2
  Filled 2022-03-19 – 2022-03-26 (×3): qty 30, 30d supply, fill #3

## 2021-09-18 ENCOUNTER — Other Ambulatory Visit: Payer: Self-pay

## 2021-09-18 LAB — CMP14+EGFR
ALT: 24 IU/L (ref 0–32)
AST: 39 IU/L (ref 0–40)
Albumin/Globulin Ratio: 1.3 (ref 1.2–2.2)
Albumin: 3.9 g/dL (ref 3.8–4.9)
Alkaline Phosphatase: 94 IU/L (ref 44–121)
BUN/Creatinine Ratio: 9 (ref 9–23)
BUN: 6 mg/dL (ref 6–24)
Bilirubin Total: 0.6 mg/dL (ref 0.0–1.2)
CO2: 24 mmol/L (ref 20–29)
Calcium: 9.4 mg/dL (ref 8.7–10.2)
Chloride: 102 mmol/L (ref 96–106)
Creatinine, Ser: 0.68 mg/dL (ref 0.57–1.00)
Globulin, Total: 3.1 g/dL (ref 1.5–4.5)
Glucose: 104 mg/dL — ABNORMAL HIGH (ref 70–99)
Potassium: 4.2 mmol/L (ref 3.5–5.2)
Sodium: 140 mmol/L (ref 134–144)
Total Protein: 7 g/dL (ref 6.0–8.5)
eGFR: 101 mL/min/{1.73_m2} (ref >59)

## 2021-09-18 LAB — LIPID PANEL
Chol/HDL Ratio: 3 ratio (ref 0.0–4.4)
Cholesterol, Total: 222 mg/dL — ABNORMAL HIGH (ref 100–199)
HDL: 73 mg/dL (ref >39)
LDL Chol Calc (NIH): 129 mg/dL — ABNORMAL HIGH (ref 0–99)
Triglycerides: 114 mg/dL (ref 0–149)
VLDL Cholesterol Cal: 20 mg/dL (ref 5–40)

## 2021-09-18 LAB — CBC WITH DIFFERENTIAL/PLATELET
Basophils Absolute: 0 10*3/uL (ref 0.0–0.2)
Basos: 1 %
EOS (ABSOLUTE): 0.1 10*3/uL (ref 0.0–0.4)
Eos: 2 %
Hematocrit: 44.3 % (ref 34.0–46.6)
Hemoglobin: 15.3 g/dL (ref 11.1–15.9)
Immature Grans (Abs): 0 10*3/uL (ref 0.0–0.1)
Immature Granulocytes: 0 %
Lymphocytes Absolute: 2.3 10*3/uL (ref 0.7–3.1)
Lymphs: 43 %
MCH: 33.7 pg — ABNORMAL HIGH (ref 26.6–33.0)
MCHC: 34.5 g/dL (ref 31.5–35.7)
MCV: 98 fL — ABNORMAL HIGH (ref 79–97)
Monocytes Absolute: 0.6 10*3/uL (ref 0.1–0.9)
Monocytes: 10 %
Neutrophils Absolute: 2.4 10*3/uL (ref 1.4–7.0)
Neutrophils: 44 %
Platelets: 121 10*3/uL — ABNORMAL LOW (ref 150–450)
RBC: 4.54 x10E6/uL (ref 3.77–5.28)
RDW: 13.7 % (ref 11.7–15.4)
WBC: 5.5 10*3/uL (ref 3.4–10.8)

## 2021-09-19 ENCOUNTER — Telehealth (INDEPENDENT_AMBULATORY_CARE_PROVIDER_SITE_OTHER): Payer: Self-pay

## 2021-09-19 ENCOUNTER — Other Ambulatory Visit: Payer: Self-pay

## 2021-09-19 ENCOUNTER — Other Ambulatory Visit (INDEPENDENT_AMBULATORY_CARE_PROVIDER_SITE_OTHER): Payer: Self-pay | Admitting: Primary Care

## 2021-09-19 DIAGNOSIS — E782 Mixed hyperlipidemia: Secondary | ICD-10-CM

## 2021-09-19 MED ORDER — ATORVASTATIN CALCIUM 40 MG PO TABS
40.0000 mg | ORAL_TABLET | Freq: Every day | ORAL | 3 refills | Status: DC
  Filled 2021-09-19 – 2021-12-24 (×2): qty 90, 90d supply, fill #0
  Filled 2022-03-19: qty 90, 90d supply, fill #1
  Filled 2022-07-11: qty 90, 90d supply, fill #2

## 2021-09-19 NOTE — Telephone Encounter (Signed)
-----   Message from Kerin Perna, NP sent at 09/19/2021  9:26 AM EST ----- Labs are normal except .-Your cholesterol is still high, but continued recommendations to make lifestyle changes. Your LDL is above normal. The LDL is the bad cholesterol.  Improvement in one's diet and eating healthier can bring this number down and potentially reduce one's risk of heart attack and/or stroke.  To reduce your LDL, Remember - more fruits and vegetables, more fish, and limit red meat and dairy products. More soy, nuts, beans, barley, lentils, oats and plant sterol ester enriched margarine instead of butter. I also encourage eliminating sugar and processed food.  Atorvastatin 40mg   at bedtime

## 2021-09-19 NOTE — Telephone Encounter (Signed)
Left message per DPR informing patient of lab results, medication being sent and dietary changes and advising. Asked patient to return call to [facility] at [phone] with any questions or concerns. Nat Christen, CMA

## 2021-09-24 ENCOUNTER — Other Ambulatory Visit: Payer: Self-pay

## 2021-10-02 ENCOUNTER — Other Ambulatory Visit: Payer: Self-pay | Admitting: Obstetrics and Gynecology

## 2021-10-02 DIAGNOSIS — R9389 Abnormal findings on diagnostic imaging of other specified body structures: Secondary | ICD-10-CM

## 2021-10-03 ENCOUNTER — Encounter (HOSPITAL_BASED_OUTPATIENT_CLINIC_OR_DEPARTMENT_OTHER): Payer: Self-pay | Admitting: Obstetrics and Gynecology

## 2021-10-04 ENCOUNTER — Encounter (HOSPITAL_BASED_OUTPATIENT_CLINIC_OR_DEPARTMENT_OTHER): Payer: Self-pay | Admitting: Obstetrics and Gynecology

## 2021-10-05 ENCOUNTER — Encounter (HOSPITAL_BASED_OUTPATIENT_CLINIC_OR_DEPARTMENT_OTHER): Payer: Self-pay | Admitting: Obstetrics and Gynecology

## 2021-10-05 ENCOUNTER — Other Ambulatory Visit: Payer: Self-pay

## 2021-10-05 NOTE — Progress Notes (Signed)
Spoke w/ via phone for pre-op interview---pt Lab needs dos---- cbc t & s, has cbc with dif and cmp 09-17-2021 ask dr Ilda Basset if need to repeat labs dos              Lab results------ekg 09-12-2021 chart/epic COVID test -----patient states asymptomatic no test needed Arrive at -------530 am 10-10-2021 NPO after MN NO Solid Food.  Clear liquids from MN until---430 am Med rec completed Medications to take morning of surgery -----none Diabetic medication -----n/a Patient instructed no nail polish to be worn day of surgery Patient instructed to bring photo id and insurance card day of surgery Patient aware to have Driver (ride ) / caregiver    for 24 hours after surgery  Charline Bills friend  driver nad caregiver cell [phone] Patient Special Instructions -----none Pre-Op special Istructions -----none Patient verbalized understanding of instructions that were given at this phone interview. Patient denies shortness of breath, chest pain, fever, cough at this phone interview.

## 2021-10-09 NOTE — Progress Notes (Signed)
Patient aware of new arrival time tomorrow, 0730.

## 2021-10-10 ENCOUNTER — Ambulatory Visit (HOSPITAL_BASED_OUTPATIENT_CLINIC_OR_DEPARTMENT_OTHER)
Admission: RE | Admit: 2021-10-10 | Discharge: 2021-10-10 | Disposition: A | Discharge status: Home / Self Care | Payer: Medicaid Other | Attending: Obstetrics and Gynecology | Admitting: Obstetrics and Gynecology

## 2021-10-10 ENCOUNTER — Ambulatory Visit (HOSPITAL_BASED_OUTPATIENT_CLINIC_OR_DEPARTMENT_OTHER): Payer: Medicaid Other | Admitting: Anesthesiology

## 2021-10-10 ENCOUNTER — Encounter (HOSPITAL_BASED_OUTPATIENT_CLINIC_OR_DEPARTMENT_OTHER)
Admission: RE | Disposition: A | Discharge status: Home / Self Care | Payer: Self-pay | Source: Home / Self Care | Attending: Obstetrics and Gynecology

## 2021-10-10 ENCOUNTER — Encounter (HOSPITAL_BASED_OUTPATIENT_CLINIC_OR_DEPARTMENT_OTHER): Payer: Self-pay | Admitting: Obstetrics and Gynecology

## 2021-10-10 ENCOUNTER — Other Ambulatory Visit: Payer: Self-pay

## 2021-10-10 DIAGNOSIS — N840 Polyp of corpus uteri: Secondary | ICD-10-CM | POA: Insufficient documentation

## 2021-10-10 DIAGNOSIS — F17210 Nicotine dependence, cigarettes, uncomplicated: Secondary | ICD-10-CM | POA: Diagnosis not present

## 2021-10-10 DIAGNOSIS — F319 Bipolar disorder, unspecified: Secondary | ICD-10-CM | POA: Insufficient documentation

## 2021-10-10 DIAGNOSIS — N841 Polyp of cervix uteri: Secondary | ICD-10-CM | POA: Diagnosis not present

## 2021-10-10 DIAGNOSIS — Z9889 Other specified postprocedural states: Secondary | ICD-10-CM

## 2021-10-10 DIAGNOSIS — R9389 Abnormal findings on diagnostic imaging of other specified body structures: Secondary | ICD-10-CM

## 2021-10-10 HISTORY — DX: Presence of spectacles and contact lenses: Z97.3

## 2021-10-10 HISTORY — DX: Fatty (change of) liver, not elsewhere classified: K76.0

## 2021-10-10 HISTORY — DX: Personal history of other specified conditions: Z87.898

## 2021-10-10 HISTORY — DX: Abnormal findings on diagnostic imaging of other specified body structures: R93.89

## 2021-10-10 HISTORY — DX: Personal history of other mental and behavioral disorders: Z86.59

## 2021-10-10 HISTORY — PX: HYSTEROSCOPY WITH D & C: SHX1775

## 2021-10-10 HISTORY — PX: DILATATION & CURETTAGE/HYSTEROSCOPY WITH MYOSURE: SHX6511

## 2021-10-10 HISTORY — DX: Anemia, unspecified: D64.9

## 2021-10-10 HISTORY — DX: Mixed hyperlipidemia: E78.2

## 2021-10-10 HISTORY — DX: Cocaine abuse, uncomplicated: F14.10

## 2021-10-10 LAB — CBC
HCT: 38.3 % (ref 36.0–46.0)
Hemoglobin: 14.2 g/dL (ref 12.0–15.0)
MCH: 34.3 pg — ABNORMAL HIGH (ref 26.0–34.0)
MCHC: 37.1 g/dL — ABNORMAL HIGH (ref 30.0–36.0)
MCV: 92.5 fL (ref 80.0–100.0)
Platelets: 122 10*3/uL — ABNORMAL LOW (ref 150–400)
RBC: 4.14 MIL/uL (ref 3.87–5.11)
RDW: 12.4 % (ref 11.5–15.5)
WBC: 6.1 10*3/uL (ref 4.0–10.5)
nRBC: 0 % (ref 0.0–0.2)

## 2021-10-10 LAB — ABO/RH: ABO/RH(D): A POS

## 2021-10-10 LAB — TYPE AND SCREEN
ABO/RH(D): A POS
Antibody Screen: NEGATIVE

## 2021-10-10 SURGERY — DILATATION AND CURETTAGE /HYSTEROSCOPY
Anesthesia: General | Site: Vagina

## 2021-10-10 MED ORDER — OXYCODONE HCL 5 MG PO TABS
5.0000 mg | ORAL_TABLET | Freq: Once | ORAL | Status: AC | PRN
  Administered 2021-10-10: 11:00:00 5 mg via ORAL

## 2021-10-10 MED ORDER — PHENYLEPHRINE 40 MCG/ML (10ML) SYRINGE FOR IV PUSH (FOR BLOOD PRESSURE SUPPORT)
PREFILLED_SYRINGE | INTRAVENOUS | Status: AC
  Filled 2021-10-10: qty 10

## 2021-10-10 MED ORDER — FENTANYL CITRATE (PF) 100 MCG/2ML IJ SOLN
INTRAMUSCULAR | Status: DC | PRN
  Administered 2021-10-10 (×2): 50 ug via INTRAVENOUS

## 2021-10-10 MED ORDER — WHITE PETROLATUM EX OINT
TOPICAL_OINTMENT | CUTANEOUS | Status: AC
  Filled 2021-10-10: qty 5

## 2021-10-10 MED ORDER — MIDAZOLAM HCL 5 MG/5ML IJ SOLN
INTRAMUSCULAR | Status: DC | PRN
  Administered 2021-10-10: 2 mg via INTRAVENOUS

## 2021-10-10 MED ORDER — DEXAMETHASONE SODIUM PHOSPHATE 10 MG/ML IJ SOLN
INTRAMUSCULAR | Status: DC | PRN
  Administered 2021-10-10: 10 mg via INTRAVENOUS

## 2021-10-10 MED ORDER — PROPOFOL 10 MG/ML IV BOLUS
INTRAVENOUS | Status: AC
  Filled 2021-10-10: qty 20

## 2021-10-10 MED ORDER — LACTATED RINGERS IV SOLN: INTRAVENOUS | Status: DC

## 2021-10-10 MED ORDER — SODIUM CHLORIDE 0.9 % IR SOLN
Status: DC | PRN
  Administered 2021-10-10: 2000 mL

## 2021-10-10 MED ORDER — ONDANSETRON HCL 4 MG/2ML IJ SOLN: 4.0000 mg | Freq: Once | INTRAMUSCULAR | Status: DC | PRN

## 2021-10-10 MED ORDER — OXYCODONE HCL 5 MG PO TABS: 5.0000 mg | ORAL_TABLET | ORAL | 0 refills | Status: DC | PRN

## 2021-10-10 MED ORDER — ONDANSETRON HCL 4 MG/2ML IJ SOLN
INTRAMUSCULAR | Status: AC
  Filled 2021-10-10: qty 2

## 2021-10-10 MED ORDER — LIDOCAINE 2% (20 MG/ML) 5 ML SYRINGE
INTRAMUSCULAR | Status: AC
  Filled 2021-10-10: qty 5

## 2021-10-10 MED ORDER — KETOROLAC TROMETHAMINE 30 MG/ML IJ SOLN
INTRAMUSCULAR | Status: DC | PRN
  Administered 2021-10-10: 30 mg via INTRAVENOUS

## 2021-10-10 MED ORDER — DEXAMETHASONE SODIUM PHOSPHATE 10 MG/ML IJ SOLN
INTRAMUSCULAR | Status: AC
  Filled 2021-10-10: qty 1

## 2021-10-10 MED ORDER — AMISULPRIDE (ANTIEMETIC) 5 MG/2ML IV SOLN: 10.0000 mg | Freq: Once | INTRAVENOUS | Status: DC | PRN

## 2021-10-10 MED ORDER — FENTANYL CITRATE (PF) 100 MCG/2ML IJ SOLN
INTRAMUSCULAR | Status: AC
  Filled 2021-10-10: qty 2

## 2021-10-10 MED ORDER — ONDANSETRON HCL 4 MG/2ML IJ SOLN
INTRAMUSCULAR | Status: DC | PRN
  Administered 2021-10-10: 4 mg via INTRAVENOUS

## 2021-10-10 MED ORDER — PROPOFOL 10 MG/ML IV BOLUS
INTRAVENOUS | Status: DC | PRN
  Administered 2021-10-10: 30 mg via INTRAVENOUS
  Administered 2021-10-10: 200 mg via INTRAVENOUS
  Administered 2021-10-10: 30 mg via INTRAVENOUS

## 2021-10-10 MED ORDER — LIDOCAINE HCL 1 % IJ SOLN
INTRAMUSCULAR | Status: DC | PRN
  Administered 2021-10-10: 20 mL

## 2021-10-10 MED ORDER — LIDOCAINE 2% (20 MG/ML) 5 ML SYRINGE
INTRAMUSCULAR | Status: DC | PRN
  Administered 2021-10-10: 100 mg via INTRAVENOUS

## 2021-10-10 MED ORDER — DOCUSATE SODIUM 100 MG PO CAPS: 100.0000 mg | ORAL_CAPSULE | Freq: Two times a day (BID) | ORAL | 0 refills | Status: DC | PRN

## 2021-10-10 MED ORDER — OXYCODONE HCL 5 MG/5ML PO SOLN: 5.0000 mg | Freq: Once | ORAL | Status: AC | PRN

## 2021-10-10 MED ORDER — MIDAZOLAM HCL 2 MG/2ML IJ SOLN
INTRAMUSCULAR | Status: AC
  Filled 2021-10-10: qty 2

## 2021-10-10 MED ORDER — FENTANYL CITRATE (PF) 100 MCG/2ML IJ SOLN
25.0000 ug | INTRAMUSCULAR | Status: DC | PRN
  Administered 2021-10-10: 10:00:00 25 ug via INTRAVENOUS

## 2021-10-10 MED ORDER — OXYCODONE HCL 5 MG PO TABS
ORAL_TABLET | ORAL | Status: AC
  Filled 2021-10-10: qty 1

## 2021-10-10 MED ORDER — PHENYLEPHRINE 40 MCG/ML (10ML) SYRINGE FOR IV PUSH (FOR BLOOD PRESSURE SUPPORT)
PREFILLED_SYRINGE | INTRAVENOUS | Status: DC | PRN
  Administered 2021-10-10: 120 ug via INTRAVENOUS

## 2021-10-10 SURGICAL SUPPLY — 14 items
CATH ROBINSON RED A/P 16FR (CATHETERS) ×4 IMPLANT
DEVICE MYOSURE REACH (MISCELLANEOUS) ×2 IMPLANT
DRSG TELFA 3X8 NADH (GAUZE/BANDAGES/DRESSINGS) ×4 IMPLANT
GAUZE 4X4 16PLY ~~LOC~~+RFID DBL (SPONGE) ×8 IMPLANT
GLOVE SURG POLYISO LF SZ7 (GLOVE) ×4 IMPLANT
GLOVE SURG UNDER POLY LF SZ7 (GLOVE) ×4 IMPLANT
GLOVE SURG UNDER POLY LF SZ7.5 (GLOVE) ×4 IMPLANT
GOWN STRL REUS W/TWL XL LVL3 (GOWN DISPOSABLE) ×4 IMPLANT
KIT PROCEDURE FLUENT (KITS) ×4 IMPLANT
PACK VAGINAL MINOR WOMEN LF (CUSTOM PROCEDURE TRAY) ×4 IMPLANT
PAD DRESSING TELFA 3X8 NADH (GAUZE/BANDAGES/DRESSINGS) ×2 IMPLANT
PAD OB MATERNITY 4.3X12.25 (PERSONAL CARE ITEMS) ×4 IMPLANT
PAD PREP 24X48 CUFFED NSTRL (MISCELLANEOUS) ×4 IMPLANT
TOWEL OR 17X26 10 PK STRL BLUE (TOWEL DISPOSABLE) ×4 IMPLANT

## 2021-10-10 NOTE — Discharge Instructions (Addendum)
We will discuss your surgery once again in detail at your post-op visit in two to four weeks. If you havent already done so, please call to make your appointment as soon as possible.  Dilation and Curettage or Vacuum Curettage, Care After These instructions give you information on caring for yourself after your procedure. Your doctor may also give you more specific instructions. Call your doctor if you have any problems or questions after your procedure. HOME CARE Do not drive for 24 hours. Wait 1 week before doing any activities that wear you out. Do not stand for a long time. Limit stair climbing to once or twice a day. Rest often. Continue with your usual diet. Drink enough fluids to keep your pee (urine) clear or pale yellow. If you have a hard time pooping (constipation), you may: Take a medicine to help you go poop (laxative) as told by your doctor. Eat more fruit and bran. Drink more fluids. Take showers, not baths, for as long as told by your doctor. Do not swim or use a hot tub until your doctor says it is okay. Have someone with you for 1day after the procedure. Do not douche, use tampons, or have sex (intercourse) until seen by your doctor Only take medicines as told by your doctor. Do not take aspirin. It can cause bleeding. Keep all doctor visits. GET HELP IF: You have cramps or pain not helped by medicine. You have new pain in the belly (abdomen). You have a bad smelling fluid coming from your vagina. You have a rash. You have problems with any medicine. GET HELP RIGHT AWAY IF:  You start to bleed more than a regular period. You have a fever. You have chest pain. You have trouble breathing. You feel dizzy or feel like passing out (fainting). You pass out. You have pain in the tops of your shoulders. You have vaginal bleeding with or without clumps of blood (blood clots). MAKE SURE YOU: Understand these instructions. Will watch your condition. Will get help  right away if you are not doing well or get worse. Document Released: 07/09/2008 Document Revised: 10/05/2013 Document Reviewed: 04/29/2013 [facility] Patient Information 2015 [place], [place]. This information is not intended to replace advice given to you by your health care provider. Make sure you discuss any questions you have with your health care provider.    Post Anesthesia Home Care Instructions  Activity: Get plenty of rest for the remainder of the day. A responsible individual must stay with you for 24 hours following the procedure.  For the next 24 hours, DO NOT: -Drive a car -Paediatric nurse -Drink alcoholic beverages -Take any medication unless instructed by your physician -Make any legal decisions or sign important papers.  Meals: Start with liquid foods such as gelatin or soup. Progress to regular foods as tolerated. Avoid greasy, spicy, heavy foods. If nausea and/or vomiting occur, drink only clear liquids until the nausea and/or vomiting subsides. Call your physician if vomiting continues.  Special Instructions/Symptoms: Your throat may feel dry or sore from the anesthesia or the breathing tube placed in your throat during surgery. If this causes discomfort, gargle with warm salt water. The discomfort should disappear within 24 hours.  If you had a scopolamine patch placed behind your ear for the management of post- operative nausea and/or vomiting:  1. The medication in the patch is effective for 72 hours, after which it should be removed.  Wrap patch in a tissue and discard in the trash. Wash  hands thoroughly with soap and water. 2. You may remove the patch earlier than 72 hours if you experience unpleasant side effects which may include dry mouth, dizziness or visual disturbances. 3. Avoid touching the patch. Wash your hands with soap and water after contact with the patch.    NO IBUPROFEN PRODUCTS (MOTRIN, ADVIL) OR ALEVE UNTIL 4:00PM TODAY.

## 2021-10-10 NOTE — Anesthesia Postprocedure Evaluation (Signed)
Anesthesia Post Note  Patient: Ashaya Raftery  Procedure(s) Performed: DILATATION AND CURETTAGE /HYSTEROSCOPY (Vagina ) DILATATION & CURETTAGE/HYSTEROSCOPY WITH MYOSURE,polyectomy (Vagina )     Patient location during evaluation: PACU Anesthesia Type: General Level of consciousness: awake and alert Pain management: pain level controlled Vital Signs Assessment: post-procedure vital signs reviewed and stable Respiratory status: spontaneous breathing, nonlabored ventilation and respiratory function stable Cardiovascular status: blood pressure returned to baseline and stable Postop Assessment: no apparent nausea or vomiting Anesthetic complications: no   No notable events documented.  Last Vitals:  Vitals:   10/10/21 1015 10/10/21 1030  BP: 116/78 (!) 126/93  Pulse: 79 73  Resp: (!) 23 (!) 21  Temp:    SpO2: 94% 95%    Last Pain:  Vitals:   10/10/21 1030  TempSrc:   PainSc: 7                  Lorris Carducci E Cecelia Graciano

## 2021-10-10 NOTE — Op Note (Signed)
Operative Note   10/10/2021  PRE-OP DIAGNOSIS: Thickened endometrium on ultrasound with irregularities    POST-OP DIAGNOSIS: Same. Endometrial polyps    SURGEON: Surgeon(s) and Role:    * Lavinia Mcneely, Eduard Clos, MD - Primary  ASSISTANT: None  PROCEDURE: Hysteroscopy, Myosure polypectomy, Dilation and curettage  ANESTHESIA: General and paracervical block  ESTIMATED BLOOD LOSS: 53mL  DRAINS: per anesthesia note   TOTAL IV FLUIDS: per anesthesia   SPECIMENS: endometrial polyps, endometrial curettings  VTE PROPHYLAXIS: SCDs to the bilateral lower extremities  ANTIBIOTICS: not indicated  FLUID DEFICIT: 51WC  COMPLICATIONS: none  DISPOSITION: PACU - hemodynamically stable.  CONDITION: stable   FINDINGS: Exam under anesthesia revealed normal EGBUS, vagina and cervix with moderate atrophy for all of them. Patient sounded to 8cm. On hysteroscopy, two 5-36mm polyps were seen: one at the lower uterine segment near the internal os and the other from the right fundal area.  Two very tiny intracervical polyps vs normal anatomic variation; all polyps (intrauterine and cervical) appeared benign.  Uterine cavity was atrophic appearing and and normal bilateral tubal ostia. After polypectomy, uterine cavity and endocervical canal was flush.  Scant curettings after polypectomy and normal appearing cavity and no evidence of perforation at end of case hysteroscopy.   PROCEDURE IN DETAIL:  After informed consent was obtained, the patient was taken to the operating room where anesthesia was obtained without difficulty. The patient was positioned in the dorsal lithotomy position in [place].  The patient's bladder was catheterized with an in and out foley catheter.  The patient was examined under anesthesia, with the above noted findings.  The bi-valved speculum was placed inside the patient's vagina, and the the anterior lip of the cervix was seen and grasped with the tenaculum.  A paracervical block  was achieved with 18mL of 1% lidocaine.  The uterine cavity was sounded to 8cm, and then the cervix was easily and progressively dilated to a 19 French-Pratt dilator.  The hysteroscope was introduced, with the above noted findings. Polypectomy done, hysteroscope removed, and the uterine cavity was curetted until a gritty texture was noted, yielding scant endometrial curettings.  Hysteroscopy repeated with above noted findings.  Excellent hemostasis was noted, and all instruments were removed, with excellent hemostasis noted throughout.  She was then taken out of dorsal lithotomy. The patient tolerated the procedure well.  Sponge, lap and instrument counts were correct x2.  The patient was taken to recovery room in excellent condition.  Durene Romans MD Attending Center for [employer] farm manager)

## 2021-10-10 NOTE — Anesthesia Procedure Notes (Signed)
Procedure Name: LMA Insertion Date/Time: 10/10/2021 9:30 AM Performed by: Rogers Blocker, CRNA Pre-anesthesia Checklist: Patient identified, Emergency Drugs available, Suction available and Patient being monitored Patient Re-evaluated:Patient Re-evaluated prior to induction Oxygen Delivery Method: Circle System Utilized Preoxygenation: Pre-oxygenation with 100% oxygen Induction Type: IV induction Ventilation: Mask ventilation without difficulty LMA: LMA inserted LMA Size: 4.0 Number of attempts: 1 Airway Equipment and Method: Bite block Placement Confirmation: positive ETCO2 Tube secured with: Tape Dental Injury: Teeth and Oropharynx as per pre-operative assessment

## 2021-10-10 NOTE — Anesthesia Preprocedure Evaluation (Signed)
Anesthesia Evaluation  Patient identified by MRN, date of birth, ID band Patient awake    Reviewed: Allergy & Precautions, NPO status , Patient's Chart, lab work & pertinent test results  History of Anesthesia Complications Negative for: history of anesthetic complications  Airway Mallampati: II  TM Distance: >3 FB Neck ROM: Full    Dental  (+) Dental Advisory Given   Pulmonary Current Smoker and Patient abstained from smoking.,    Pulmonary exam normal        Cardiovascular negative cardio ROS Normal cardiovascular exam     Neuro/Psych Seizures - (2/2 alcohol withdrawal),  PSYCHIATRIC DISORDERS Bipolar Disorder    GI/Hepatic negative GI ROS, (+)     substance abuse  alcohol use,   Endo/Other  negative endocrine ROS  Renal/GU negative Renal ROS  negative genitourinary   Musculoskeletal negative musculoskeletal ROS (+)   Abdominal   Peds  Hematology negative hematology ROS (+)   Anesthesia Other Findings   Reproductive/Obstetrics                             Anesthesia Physical Anesthesia Plan  ASA: 3  Anesthesia Plan: General   Post-op Pain Management: Toradol IV (intra-op)   Induction: Intravenous  PONV Risk Score and Plan: 2 and Ondansetron, Dexamethasone, Midazolam and Treatment may vary due to age or medical condition  Airway Management Planned: LMA  Additional Equipment: None  Intra-op Plan:   Post-operative Plan: Extubation in OR  Informed Consent: I have reviewed the patients History and Physical, chart, labs and discussed the procedure including the risks, benefits and alternatives for the proposed anesthesia with the patient or authorized representative who has indicated his/her understanding and acceptance.     Dental advisory given  Plan Discussed with:   Anesthesia Plan Comments:         Anesthesia Quick Evaluation

## 2021-10-10 NOTE — Transfer of Care (Signed)
Immediate Anesthesia Transfer of Care Note  Patient: Janet Ramos  Procedure(s) Performed: DILATATION AND CURETTAGE /HYSTEROSCOPY (Vagina ) DILATATION & CURETTAGE/HYSTEROSCOPY WITH MYOSURE,polyectomy (Vagina )  Patient Location: PACU  Anesthesia Type:General  Level of Consciousness: awake, alert , oriented and patient cooperative  Airway & Oxygen Therapy: Patient Spontanous Breathing  Post-op Assessment: Report given to RN and Post -op Vital signs reviewed and stable  Post vital signs: Reviewed and stable  Last Vitals:  Vitals Value Taken Time  BP 101/74 10/10/21 1002  Temp    Pulse 88 10/10/21 1005  Resp 27 10/10/21 1005  SpO2 93 % 10/10/21 1005  Vitals shown include unvalidated device data.  Last Pain:  Vitals:   10/10/21 0753  TempSrc: Oral  PainSc: 0-No pain         Complications: No notable events documented.

## 2021-10-10 NOTE — H&P (Signed)
Obstetrics & Gynecology Surgical H&P   Date of Surgery: 10/10/2021    OBGYN Clinic: [facility]   Primary Care Provider: Kerin Perna   Chief Complaint: scheduled surgery   History of Present Illness: Janet Ramos is a 60 y.o. African-American [phone] (Patient's last menstrual period was 03/15/2011.), seen for the above chief complaint.    Patient previously seen by me on 06/25/21: Patient admitted to the Hospitalist service from 8/24-8/27 for GI s/s. During this admission, she had vaginal itching and the GYN on call was called and she recommended diflucan and also provera.  Pt states she finished the diflucan x 5 doses and is continuing on the provera.   She sates her LMP was 10 years ago and she had an embx and pap in 09/2020 by Dr. Harolyn Rutherford for an asymptomatic thickened endometrial stripe see on CT scan w/u for abdominal pain.  Pap was negative cytology and neg hpv and embx was negative and showed fragments of an endometrial polyp with cystic atrophic glands and it appears plan was for expectant management.  She was previously seen in April 2019 for the same thing, but seen on u/s (42mm with multiple cystic areas present, largest 1.2cm, no vascularity), which was done for abdominal pain w/u and she received a pap and embx with negative pap and hpv and neg, benign embx with features suggestive of a polyp; he was put on provera qday and u/s ordered and follow up in august 2019 and she was still asymptomatic and plan was to continue on the provera, although it seemed like she had not been taking the provera that was given 4 months before. She was seen again for follow up in December 2019 and had spotting with the Provera with plan for rpt u/s and to continue the provera and f/u in 3 months. Patient next seen 09/2020 for re-referral for pelvic pain from her PCP; CT scan in the ED in 04/2020 showed, "redemonstration of a prominent and slightly heterogeneous  endometrial stripe measuring up to 11 mm in maximal thickness on today's examination. No concerning adnexal lesion." Patient seen by Dr. Ester Rink and had no VB or spotting. A pap and embx was done and it showed a negative pap and hpv with benign embx but did show fragments of endometrial polyp with cystic atrophic glands.  It appears that plan was expectant management.    Patient denied any bleeding or spotting; at that visit, I recommended stopping the provera and getting another u/s to assess her endometrial stripe. 9/16 u/s showed persistent endometrial thickening at 14mm with internal microcystic changes  Interval History:    Patient denies any VB or spotting    ROS: A 12-point review of systems was performed and negative, except as stated in the above HPI.  OBGYN History: As per HPI. OB History  Gravida Para Term Preterm AB Living  6 6 6     6   SAB IAB Ectopic Multiple Live Births          6    # Outcome Date GA Lbr Len/2nd Weight Sex Delivery Anes PTL Lv  6 Term           5 Term           4 Term           3 Term           2 Term           1 Term  Past Medical History: Past Medical History:  Diagnosis Date   Alcohol withdrawal ([place]) 06/06/2021   with hospital admission   Alcoholism [facility])    dependance w/ withdrawal syndrome including seizures   Anemia    Bipolar 1 disorder ([place])    Cocaine abuse ([place])    no cocaine used in 6 years per pt on 10-05-2021   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   left eye injury 09-16-2014  s/p ruptured globe repair;   09-23-2014 s/p enucleation w/ implant   Fatty liver    with elevated LFTs , 06-06-2021   History of gastritis 05/2021   per EGD   History of seizure due to alcohol withdrawal    2020  none since per pt   History of trichomoniasis 2010   Hyperlipidemia, mixed    Thickened endometrium    Wears glasses     Past Surgical History: Past Surgical History:  Procedure Laterality Date   CERVICAL BIOPSY  2022   CESAREAN  SECTION  1990   ENUCLEATION Left 09/23/2014   @[facility] ;  w/ implant   ESOPHAGOGASTRODUODENOSCOPY N/A 06/07/2021   Procedure: ESOPHAGOGASTRODUODENOSCOPY (EGD);  Surgeon: Carol Ada, MD;  Location: [facility];  Service: Endoscopy;  Laterality: N/A;  Epigastric pain with nausea vomiting and 40 pound weight loss   EYE SURGERY Right 06/2015   cyst removal from medial canthus   RUPTURED GLOBE EXPLORATION AND REPAIR Left 09/16/2014   Procedure:  Ruptured Globe Repair Left Eye;  Surgeon: Lamonte Sakai, MD;  Location: [facility];  Service: Ophthalmology;  Laterality: Left;   TUBAL LIGATION Bilateral 1994    Family History:  Family History  Problem Relation Age of Onset   Lupus Mother    Heart disease Mother    Colon cancer Father 70   Thyroid disease Daughter    Bipolar disorder Daughter    Hypertension Other    Cancer Other    CAD Other      Social History:  Social History   Socioeconomic History   Marital status: Single    Spouse name: Not on file   Number of children: 6   Years of education: Not on file   Highest education level: Not on file  Occupational History   Occupation: Nurses Aide in past.    Comment: unemployed since eye injury about 1 year ago.  Tobacco Use   Smoking status: Every Day    Packs/day: 0.50    Years: 43.00    Pack years: 21.50    Types: Cigarettes    Start date: 02/24/1976   Smokeless tobacco: Never  Vaping Use   Vaping Use: Never used  Substance and Sexual Activity   Alcohol use: Yes    Alcohol/week: 8.0 standard drinks    Types: 8 Standard drinks or equivalent per week    Comment: alcoholism   Drug use: Yes    Types: Marijuana, Cocaine    Comment: hx of cocaine use last cocaine use 6 yrs ago and smokes marijuna daily per pt on 10-05-2021   Sexual activity: Not Currently    Birth control/protection: Surgical  Other Topics Concern   Not on file  Social History Narrative   Originally from [place], but moved to [place] at age 42 yo.     Lived  with mother in [place].   Younger sister killed there--the man who was thought to be the killer was murdered in front of the patient's home by some of her sister's friends.   Older sister is felt to have  been kidnapped by her [place] husband and take to [place]--occurred 5 years ago.   Middle sister infected with HIV from her husband.    Pt. Lives with a female platonic friend.    Social Determinants of Radio broadcast assistant Strain: Not on file  Food Insecurity: Food Insecurity Present   Worried About Charity fundraiser in the Last Year: Never true   Arboriculturist in the Last Year: Sometimes true  Transportation Needs: Public librarian (Medical): Yes   Lack of Transportation (Non-Medical): Yes  Physical Activity: Not on file  Stress: Not on file  Social Connections: Not on file  Intimate Partner Violence: Not on file     Allergy: No Known Allergies  Current Outpatient Medications: Medications Prior to Admission  Medication Sig Dispense Refill Last Dose   atorvastatin (LIPITOR) 40 MG tablet Take 1 tablet (40 mg total) by mouth daily. (Patient taking differently: Take 40 mg by mouth at bedtime.) 90 tablet 3 10/09/2021   risperiDONE (RISPERDAL) 1 MG tablet TAKE 1 TABLET (1 MG TOTAL) BY MOUTH AT BEDTIME. 90 tablet 1 10/09/2021     Hospital Medications: Current Facility-Administered Medications  Medication Dose Route Frequency Provider Last Rate Last Admin   lactated ringers infusion   Intravenous Continuous Freddrick March, MD 50 mL/hr at 10/10/21 0809 New Bag at 10/10/21 0809   lactated ringers infusion   Intravenous Continuous Aletha Halim, MD         Physical Exam:  Current Vital Signs 24h Vital Sign Ranges  T 98 F (36.7 C) Temp  Avg: 98 F (36.7 C)  Min: 98 F (36.7 C)  Max: 98 F (36.7 C)  BP 117/78 BP  Min: 117/78  Max: 117/78  HR 95 Pulse  Avg: 95  Min: 95  Max: 95  RR 16 Resp  Avg: 16  Min: 16  Max: 16  SaO2  99 % Room Air SpO2  Avg: 99 %  Min: 99 %  Max: 99 %       24 Hour I/O Current Shift I/O  Time Ins Outs No intake/output data recorded. No intake/output data recorded.    Body mass index is 28.77 kg/m. General appearance: Well nourished, well developed female in no acute distress.  Neck:  Supple, normal appearance, and no thyromegaly  Cardiovascular: normal s1 and s2.  No murmurs, rubs or gallops. Respiratory:  Clear to auscultation bilateral. Normal respiratory effort Abdomen: positive bowel sounds and no masses, hernias; diffusely non tender to palpation, non distended Neuro/Psych:  Normal mood and affect.  Skin:  Warm and dry.    Laboratory: Pending cbc, t&s  Imaging:  Narrative & Impression  CLINICAL DATA:  Follow-up evaluation of endometrial lining.   EXAM: ULTRASOUND PELVIS TRANSVAGINAL   TECHNIQUE: Transvaginal ultrasound examination of the pelvis was performed including evaluation of the uterus, ovaries, adnexal regions, and pelvic cul-de-sac.   COMPARISON:  Prior ultrasound from 12/21/2018.   FINDINGS: Uterus   Measurements: 5.9 x 3.1 x 3.9 cm = volume: 37.1 mL. Uterus is anteverted. No discrete fibroid or other mass.   Endometrium   Thickness: 10 mm. Endometrial complex demonstrates a heterogeneous echotexture with internal microcystic change. No superimposed focal lesion.   Right ovary   Not visualized.  No adnexal mass.   Left ovary   Not visualized.  No adnexal mass.   Other findings:  No abnormal free fluid   IMPRESSION: 1. Endometrial stripe thickened up  to 10 mm with internal microcystic change, previously 25 mm on 12/21/2018. Again, finding is suspicious for possible endometrial hyperplasia, although endometrial carcinoma could also have this appearance. Endometrial sampling should be considered to exclude carcinoma if not already performed. 2. Nonvisualization of either ovary.  No adnexal mass or free fluid.     Electronically  Signed   By: Jeannine Boga M.D.   On: 06/30/2021 18:24     Assessment: Ms. Tamm is a 60 y.o. [phone] (Patient's last menstrual period was 03/15/2011.) here for scheduled surgery; pt doing well  Plan: D/w her re: hysteroscopy, d&c and she is amenable to still proceeding. Post op instructions reviewed with her. Can proceed when labs are back   Durene Romans. MD Attending Center for [facility])

## 2021-10-11 ENCOUNTER — Encounter: Payer: Self-pay | Admitting: Gastroenterology

## 2021-10-11 ENCOUNTER — Encounter (HOSPITAL_BASED_OUTPATIENT_CLINIC_OR_DEPARTMENT_OTHER): Payer: Self-pay | Admitting: Obstetrics and Gynecology

## 2021-10-11 LAB — SURGICAL PATHOLOGY

## 2021-10-15 ENCOUNTER — Telehealth: Payer: Self-pay | Admitting: Radiology

## 2021-10-15 NOTE — Telephone Encounter (Signed)
Spoke with Patient and informed her of follow up appointment on 10/28/21 @ 11:15 with Dr Ilda Basset

## 2021-11-07 ENCOUNTER — Ambulatory Visit: Payer: Medicaid Other | Admitting: Obstetrics and Gynecology

## 2021-11-26 ENCOUNTER — Encounter: Payer: Self-pay | Admitting: Obstetrics and Gynecology

## 2021-11-26 ENCOUNTER — Ambulatory Visit (INDEPENDENT_AMBULATORY_CARE_PROVIDER_SITE_OTHER): Payer: Medicaid Other | Admitting: Obstetrics and Gynecology

## 2021-11-26 ENCOUNTER — Ambulatory Visit (AMBULATORY_SURGERY_CENTER): Payer: Medicaid Other | Admitting: *Deleted

## 2021-11-26 ENCOUNTER — Other Ambulatory Visit: Payer: Self-pay

## 2021-11-26 VITALS — Ht 64.0 in | Wt 160.0 lb

## 2021-11-26 VITALS — BP 118/82 | HR 87 | Wt 173.0 lb

## 2021-11-26 DIAGNOSIS — Z1211 Encounter for screening for malignant neoplasm of colon: Secondary | ICD-10-CM

## 2021-11-26 DIAGNOSIS — Z09 Encounter for follow-up examination after completed treatment for conditions other than malignant neoplasm: Secondary | ICD-10-CM

## 2021-11-26 DIAGNOSIS — Z800 Family history of malignant neoplasm of digestive organs: Secondary | ICD-10-CM

## 2021-11-26 MED ORDER — NA SULFATE-K SULFATE-MG SULF 17.5-3.13-1.6 GM/177ML PO SOLN
1.0000 | ORAL | 0 refills | Status: DC
  Filled 2021-11-26 – 2021-12-04 (×2): qty 354, 1d supply, fill #0

## 2021-11-26 NOTE — Progress Notes (Signed)
Patient's pre-visit was done today over the phone with the patient. Name,DOB and address verified. Patient denies any allergies to Eggs and Soy. Patient denies any problems with anesthesia/sedation. Patient is not taking any diet pills or blood thinners. No home Oxygen.   Prep instructions sent to email and mailed-pt aware. Patient understands to call us back with any questions or concerns. Patient is aware of our care-partner policy and [id number] safety protocol.    The patient is COVID-19 vaccinated.

## 2021-11-27 NOTE — Progress Notes (Signed)
Obstetrics and Gynecology Visit Return Patient Evaluation  Appointment Date: 11/26/2021  Primary Care Provider: [place] [place]  Chief Complaint: regular post op check  History of Present Illness:  Janet Ramos is a 61 y.o. s/p 12/28 hysteroscoy, myosure polypectomy, d&c for thickened endometrium with irregularities on u/s; intra op findings showed a large, benign appearing endometrial polyp with final pathology benign.  Interval History: Since that time, she states that she is doing well and w/o any problems or issues.   Review of Systems: as noted in the History of Present Illness.   Medications:  Janet Ramos had no medications administered during this visit. Current Outpatient Medications  Medication Sig Dispense Refill   atorvastatin (LIPITOR) 40 MG tablet Take 1 tablet (40 mg total) by mouth daily. (Patient taking differently: Take 40 mg by mouth at bedtime.) 90 tablet 3   docusate sodium (COLACE) 100 MG capsule Take 1 capsule (100 mg total) by mouth 2 (two) times daily as needed for mild constipation. 30 capsule 0   Na Sulfate-K Sulfate-Mg Sulf 17.5-3.13-1.6 GM/177ML SOLN Take 1 kit by mouth as directed. May use generic Suprep 354 mL 0   risperiDONE (RISPERDAL) 1 MG tablet TAKE 1 TABLET (1 MG TOTAL) BY MOUTH AT BEDTIME. 90 tablet 1   No current facility-administered medications for this visit.    Allergies: has No Known Allergies.  Physical Exam:  BP 118/82    Pulse 87    Wt 173 lb (78.5 kg)    LMP 03/15/2011    BMI 29.70 kg/m  Body mass index is 29.7 kg/m. General appearance: Well nourished, well developed female in no acute distress.  Abdomen: diffusely non tender to palpation, non distended, and no masses, hernias Neuro/Psych:  Normal mood and affect.     Assessment: pt doing well  Plan:  1. Postop check D/w her to call with any future post menopausal bleeding or spotting   RTC:  PRN  Durene Romans MD Attending Center for [facility])

## 2021-12-03 ENCOUNTER — Other Ambulatory Visit: Payer: Self-pay

## 2021-12-04 ENCOUNTER — Other Ambulatory Visit: Payer: Self-pay

## 2021-12-05 ENCOUNTER — Other Ambulatory Visit: Payer: Self-pay

## 2021-12-10 ENCOUNTER — Telehealth: Payer: Self-pay | Admitting: Gastroenterology

## 2021-12-10 ENCOUNTER — Encounter: Payer: Medicaid Other | Admitting: Gastroenterology

## 2021-12-13 ENCOUNTER — Telehealth: Payer: Self-pay

## 2021-12-13 NOTE — Telephone Encounter (Signed)
Patient contacted to schedule mammogram.  ? ?RE: Mobile Mammo event located at: ? ?[facility]  ?      ?[address]    ?[place] [address]    ? ?Date: April 7th at 9:10am ? ?When reminder call is sent, ensure patient has the address.  ?

## 2021-12-24 ENCOUNTER — Other Ambulatory Visit: Payer: Self-pay

## 2022-01-04 ENCOUNTER — Ambulatory Visit (INDEPENDENT_AMBULATORY_CARE_PROVIDER_SITE_OTHER): Payer: Medicaid Other | Admitting: Primary Care

## 2022-01-14 ENCOUNTER — Other Ambulatory Visit: Payer: Self-pay

## 2022-01-16 NOTE — Telephone Encounter (Signed)
Reminder call of upcoming appt    ?      ?RE: Mobile Mammo event located at:   ?      ? [facility]  ?      ?[address]    ?[place] [address]    ? ?Patient inform and confirmed appt.  ?

## 2022-01-18 ENCOUNTER — Ambulatory Visit
Admission: RE | Admit: 2022-01-18 | Discharge: 2022-01-18 | Disposition: A | Discharge status: Home / Self Care | Payer: Medicaid Other | Source: Ambulatory Visit | Attending: Primary Care | Admitting: Primary Care

## 2022-01-18 DIAGNOSIS — Z1231 Encounter for screening mammogram for malignant neoplasm of breast: Secondary | ICD-10-CM

## 2022-01-23 ENCOUNTER — Encounter (INDEPENDENT_AMBULATORY_CARE_PROVIDER_SITE_OTHER): Payer: Self-pay | Admitting: Primary Care

## 2022-01-23 ENCOUNTER — Ambulatory Visit (INDEPENDENT_AMBULATORY_CARE_PROVIDER_SITE_OTHER): Payer: Medicaid Other | Admitting: Primary Care

## 2022-01-23 VITALS — BP 131/83 | HR 91 | Temp 97.6°F | Ht 64.0 in | Wt 171.2 lb

## 2022-01-23 DIAGNOSIS — E876 Hypokalemia: Secondary | ICD-10-CM | POA: Diagnosis not present

## 2022-01-23 DIAGNOSIS — E782 Mixed hyperlipidemia: Secondary | ICD-10-CM | POA: Diagnosis not present

## 2022-01-23 DIAGNOSIS — Z780 Asymptomatic menopausal state: Secondary | ICD-10-CM

## 2022-01-23 DIAGNOSIS — M79604 Pain in right leg: Secondary | ICD-10-CM

## 2022-01-23 DIAGNOSIS — M79605 Pain in left leg: Secondary | ICD-10-CM

## 2022-01-23 NOTE — Progress Notes (Signed)
?[place] ? ?Janet Ramos, is a 61 y.o. female ? ?[id number] ? ?[id number] ? ?DOB - 1961-09-06 ? ?Chief Complaint  ?Patient presents with  ? Follow-up  ?  After surgery  ?    ? ?Subjective:  ? ?Janet Ramos is a 61 y.o. female here today for concerns of bilateral leg pain starts at her toes and travels up to her back. Sometimes she is unable to walk due to the pain. 9/10 lingering pain . Patient has No headache, No chest pain, No abdominal pain - No Nausea, No new weakness tingling or numbness, No Cough - shortness of breath ? ?No problems updated. ? ?No Known Allergies ? ?Past Medical History:  ?Diagnosis Date  ? Alcohol withdrawal ([place]) 06/06/2021  ? with hospital admission  ? Alcoholism ([place])   ? dependance w/ withdrawal syndrome including seizures  ? Anemia   ? Bipolar 1 disorder ([place])   ? Cocaine abuse ([place])   ? no cocaine used in 6 years per pt on 10-05-2021  ? 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 left eye injury 09-16-2014  s/p ruptured globe repair;   09-23-2014 s/p enucleation w/ implant  ? Fatty liver   ? with elevated LFTs , 06-06-2021  ? History of gastritis 05/2021  ? per EGD  ? History of seizure due to alcohol withdrawal   ? 2020  none since per pt  ? History of trichomoniasis 2010  ? Hyperlipidemia, mixed   ? Seizures ([place])   ? last time 4 years ago per pt  ? Thickened endometrium   ? Wears glasses   ? ? ?Current Outpatient Medications on File Prior to Visit  ?Medication Sig Dispense Refill  ? atorvastatin (LIPITOR) 40 MG tablet Take 1 tablet (40 mg total) by mouth daily. (Patient taking differently: Take 40 mg by mouth at bedtime.) 90 tablet 3  ? risperiDONE (RISPERDAL) 1 MG tablet TAKE 1 TABLET (1 MG TOTAL) BY MOUTH AT BEDTIME. 90 tablet 1  ? docusate sodium (COLACE) 100 MG capsule Take 1 capsule (100 mg total) by mouth 2 (two) times daily as needed for mild constipation. 30 capsule 0  ? ?No current facility-administered medications on file prior to visit.  ? ? ?Objective:   ? ?Vitals:  ? 01/23/22 1535  ?BP: 131/83  ?Pulse: 91  ?Temp: 97.6 ?F (36.4 ?C)  ?TempSrc: Oral  ?SpO2: 96%  ?Weight: 171 lb 3.2 oz (77.7 kg)  ?Height: $RemoveB'5\' 4"'QDHxCdub$  (1.626 m)  ? ? ?Exam ?General appearance : Awake, alert, not in any distress. Speech Clear. Not toxic looking ?HEENT: Atraumatic and Normocephalic, pupils equally reactive to light and accomodation ?Neck: Supple, no JVD. No cervical lymphadenopathy.  ?Chest: Good air entry bilaterally, no added sounds  ?CVS: S1 S2 regular, no murmurs.  ?Abdomen: Bowel sounds present, Non tender and not distended with no gaurding, rigidity or rebound. ?Extremities: B/L Lower Ext shows no edema, both legs are warm to touch ?Neurology: Awake alert, and oriented X 3, CN II-XII intact, Non focal ?Skin: No Rash ? ?Data Review ?Lab Results  ?Component Value Date  ? HGBA1C 5.3 06/06/2021  ? HGBA1C 5.3 05/07/2015  ? ? ?Assessment & Plan  ? ?1. Hypokalemia ? ?- CMP14+EGFR ? ?2. Mixed hyperlipidemia ? Healthy lifestyle diet of fruits vegetables fish nuts whole grains and low saturated fat . Foods high in cholesterol or liver, fatty meats,cheese, butter avocados, nuts and seeds, chocolate and fried foods. ? ?- Lipid Panel ? ?3. Menopause ? ?- CBC with Differential  ? ? ?  Patient have been counseled extensively about nutrition and exercise. Other issues discussed during this visit include: low cholesterol diet, weight control and daily exercise, foot care, annual eye examinations at Ophthalmology, importance of adherence with medications and regular follow-up. We also discussed long term complications of uncontrolled diabetes and hypertension.  ? ?Return in about 6 months (around 07/25/2022) for hyperlipidemia. ? ?The patient was given clear instructions to go to ER or return to medical center if symptoms don't improve, worsen or new problems develop. The patient verbalized understanding. The patient was told to call to get lab results if they haven't heard anything in the next week.   ? ?This note has been created with Surveyor, quantity. Any transcriptional errors are unintentional.  ? ?Kerin Perna, NP ?01/27/2022, 4:35 PM ? ?

## 2022-01-24 LAB — CMP14+EGFR
ALT: 25 IU/L (ref 0–32)
AST: 44 IU/L — ABNORMAL HIGH (ref 0–40)
Albumin/Globulin Ratio: 1.3 (ref 1.2–2.2)
Albumin: 4.5 g/dL (ref 3.8–4.9)
Alkaline Phosphatase: 120 IU/L (ref 44–121)
BUN/Creatinine Ratio: 8 — ABNORMAL LOW (ref 9–23)
BUN: 5 mg/dL — ABNORMAL LOW (ref 6–24)
Bilirubin Total: 0.7 mg/dL (ref 0.0–1.2)
CO2: 23 mmol/L (ref 20–29)
Calcium: 10 mg/dL (ref 8.7–10.2)
Chloride: 102 mmol/L (ref 96–106)
Creatinine, Ser: 0.6 mg/dL (ref 0.57–1.00)
Globulin, Total: 3.5 g/dL (ref 1.5–4.5)
Glucose: 95 mg/dL (ref 70–99)
Potassium: 4.5 mmol/L (ref 3.5–5.2)
Sodium: 141 mmol/L (ref 134–144)
Total Protein: 8 g/dL (ref 6.0–8.5)
eGFR: 104 mL/min/{1.73_m2} (ref >59)

## 2022-01-24 LAB — CBC WITH DIFFERENTIAL/PLATELET
Basophils Absolute: 0.1 10*3/uL (ref 0.0–0.2)
Basos: 1 %
EOS (ABSOLUTE): 0.1 10*3/uL (ref 0.0–0.4)
Eos: 2 %
Hematocrit: 43.3 % (ref 34.0–46.6)
Hemoglobin: 15.2 g/dL (ref 11.1–15.9)
Immature Grans (Abs): 0 10*3/uL (ref 0.0–0.1)
Immature Granulocytes: 0 %
Lymphocytes Absolute: 2.8 10*3/uL (ref 0.7–3.1)
Lymphs: 46 %
MCH: 33.3 pg — ABNORMAL HIGH (ref 26.6–33.0)
MCHC: 35.1 g/dL (ref 31.5–35.7)
MCV: 95 fL (ref 79–97)
Monocytes Absolute: 0.5 10*3/uL (ref 0.1–0.9)
Monocytes: 8 %
Neutrophils Absolute: 2.6 10*3/uL (ref 1.4–7.0)
Neutrophils: 43 %
Platelets: 153 10*3/uL (ref 150–450)
RBC: 4.56 x10E6/uL (ref 3.77–5.28)
RDW: 12.5 % (ref 11.7–15.4)
WBC: 6.1 10*3/uL (ref 3.4–10.8)

## 2022-01-24 LAB — LIPID PANEL
Chol/HDL Ratio: 1.9 ratio (ref 0.0–4.4)
Cholesterol, Total: 195 mg/dL (ref 100–199)
HDL: 102 mg/dL (ref >39)
LDL Chol Calc (NIH): 80 mg/dL (ref 0–99)
Triglycerides: 74 mg/dL (ref 0–149)
VLDL Cholesterol Cal: 13 mg/dL (ref 5–40)

## 2022-01-25 ENCOUNTER — Telehealth (INDEPENDENT_AMBULATORY_CARE_PROVIDER_SITE_OTHER): Payer: Self-pay

## 2022-01-25 NOTE — Telephone Encounter (Signed)
Patient aware of normal labs. Janet Ramos S Fara Worthy, CMA  

## 2022-01-25 NOTE — Telephone Encounter (Signed)
-----   Message from Kerin Perna, NP sent at 01/24/2022 12:02 PM EDT ----- ?Labs are  normal. Try to drink at least 48 oz of water per day. Work on eating a low fat, heart healthy diet and participate in regular aerobic exercise program to control as well. Exercise at least  30 minutes per day-5 days per week. Monitor eating red meat, fried foods,  junk foods, sodas, sugary foods or drinks, unhealthy snacking, alcohol or smoking.    ?  ?

## 2022-02-01 ENCOUNTER — Emergency Department (HOSPITAL_COMMUNITY): Payer: Medicaid Other

## 2022-02-01 ENCOUNTER — Other Ambulatory Visit: Payer: Self-pay

## 2022-02-01 ENCOUNTER — Encounter (HOSPITAL_COMMUNITY): Payer: Self-pay | Admitting: Emergency Medicine

## 2022-02-01 ENCOUNTER — Emergency Department (HOSPITAL_COMMUNITY)
Admission: EM | Admit: 2022-02-01 | Discharge: 2022-02-01 | Disposition: A | Discharge status: Home / Self Care | Payer: Medicaid Other | Attending: Emergency Medicine | Admitting: Emergency Medicine

## 2022-02-01 DIAGNOSIS — D72829 Elevated white blood cell count, unspecified: Secondary | ICD-10-CM | POA: Diagnosis not present

## 2022-02-01 DIAGNOSIS — Y903 Blood alcohol level of 60-79 mg/100 ml: Secondary | ICD-10-CM | POA: Insufficient documentation

## 2022-02-01 DIAGNOSIS — R112 Nausea with vomiting, unspecified: Secondary | ICD-10-CM | POA: Diagnosis not present

## 2022-02-01 DIAGNOSIS — R109 Unspecified abdominal pain: Secondary | ICD-10-CM | POA: Diagnosis present

## 2022-02-01 DIAGNOSIS — D649 Anemia, unspecified: Secondary | ICD-10-CM | POA: Diagnosis not present

## 2022-02-01 DIAGNOSIS — R7989 Other specified abnormal findings of blood chemistry: Secondary | ICD-10-CM | POA: Insufficient documentation

## 2022-02-01 DIAGNOSIS — R1033 Periumbilical pain: Secondary | ICD-10-CM | POA: Insufficient documentation

## 2022-02-01 DIAGNOSIS — F1010 Alcohol abuse, uncomplicated: Secondary | ICD-10-CM | POA: Diagnosis not present

## 2022-02-01 LAB — CBC WITH DIFFERENTIAL/PLATELET
Abs Immature Granulocytes: 0.03 10*3/uL (ref 0.00–0.07)
Basophils Absolute: 0 10*3/uL (ref 0.0–0.1)
Basophils Relative: 0 %
Eosinophils Absolute: 0.1 10*3/uL (ref 0.0–0.5)
Eosinophils Relative: 1 %
HCT: 35.7 % — ABNORMAL LOW (ref 36.0–46.0)
Hemoglobin: 13.2 g/dL (ref 12.0–15.0)
Immature Granulocytes: 0 %
Lymphocytes Relative: 36 %
Lymphs Abs: 3.5 10*3/uL (ref 0.7–4.0)
MCH: 33.5 pg (ref 26.0–34.0)
MCHC: 37 g/dL — ABNORMAL HIGH (ref 30.0–36.0)
MCV: 90.6 fL (ref 80.0–100.0)
Monocytes Absolute: 1 10*3/uL (ref 0.1–1.0)
Monocytes Relative: 10 %
Neutro Abs: 5.1 10*3/uL (ref 1.7–7.7)
Neutrophils Relative %: 53 %
Platelets: 141 10*3/uL — ABNORMAL LOW (ref 150–400)
RBC: 3.94 MIL/uL (ref 3.87–5.11)
RDW: 12.6 % (ref 11.5–15.5)
WBC: 9.7 10*3/uL (ref 4.0–10.5)
nRBC: 0 % (ref 0.0–0.2)

## 2022-02-01 LAB — COMPREHENSIVE METABOLIC PANEL
ALT: 17 U/L (ref 0–44)
AST: 26 U/L (ref 15–41)
Albumin: 3.7 g/dL (ref 3.5–5.0)
Alkaline Phosphatase: 92 U/L (ref 38–126)
Anion gap: 8 (ref 5–15)
BUN: 7 mg/dL (ref 6–20)
CO2: 23 mmol/L (ref 22–32)
Calcium: 9.3 mg/dL (ref 8.9–10.3)
Chloride: 107 mmol/L (ref 98–111)
Creatinine, Ser: 0.59 mg/dL (ref 0.44–1.00)
GFR, Estimated: >60 mL/min (ref >60)
Glucose, Bld: 89 mg/dL (ref 70–99)
Potassium: 3.8 mmol/L (ref 3.5–5.1)
Sodium: 138 mmol/L (ref 135–145)
Total Bilirubin: 0.9 mg/dL (ref 0.3–1.2)
Total Protein: 7.7 g/dL (ref 6.5–8.1)

## 2022-02-01 LAB — URINALYSIS, ROUTINE W REFLEX MICROSCOPIC
Bilirubin Urine: NEGATIVE
Glucose, UA: NEGATIVE mg/dL
Hgb urine dipstick: NEGATIVE
Ketones, ur: NEGATIVE mg/dL
Leukocytes,Ua: NEGATIVE
Nitrite: NEGATIVE
Protein, ur: NEGATIVE mg/dL
Specific Gravity, Urine: 1.013 (ref 1.005–1.030)
pH: 5 (ref 5.0–8.0)

## 2022-02-01 LAB — LIPASE, BLOOD: Lipase: 26 U/L (ref 11–51)

## 2022-02-01 LAB — ETHANOL: Alcohol, Ethyl (B): 65 mg/dL — ABNORMAL HIGH (ref <10)

## 2022-02-01 MED ORDER — HYDROMORPHONE HCL 1 MG/ML IJ SOLN
1.0000 mg | Freq: Once | INTRAMUSCULAR | Status: AC
  Administered 2022-02-01: 1 mg via INTRAVENOUS
  Filled 2022-02-01: qty 1

## 2022-02-01 MED ORDER — LORAZEPAM 2 MG/ML IJ SOLN
0.0000 mg | Freq: Four times a day (QID) | INTRAMUSCULAR | Status: DC
  Administered 2022-02-01: 1 mg via INTRAVENOUS
  Filled 2022-02-01: qty 1

## 2022-02-01 MED ORDER — LIDOCAINE VISCOUS HCL 2 % MT SOLN
15.0000 mL | Freq: Once | OROMUCOSAL | Status: AC
  Administered 2022-02-01: 15 mL via ORAL
  Filled 2022-02-01: qty 15

## 2022-02-01 MED ORDER — SODIUM CHLORIDE 0.9 % IV BOLUS
1000.0000 mL | Freq: Once | INTRAVENOUS | Status: AC
Start: 2022-02-01 — End: 2022-02-01
  Administered 2022-02-01: 1000 mL via INTRAVENOUS

## 2022-02-01 MED ORDER — ALUM & MAG HYDROXIDE-SIMETH 200-200-20 MG/5ML PO SUSP
30.0000 mL | Freq: Once | ORAL | Status: AC
Start: 2022-02-01 — End: 2022-02-01
  Administered 2022-02-01: 30 mL via ORAL
  Filled 2022-02-01: qty 30

## 2022-02-01 MED ORDER — LORAZEPAM 1 MG PO TABS: 0.0000 mg | ORAL_TABLET | Freq: Two times a day (BID) | ORAL | Status: DC

## 2022-02-01 MED ORDER — THIAMINE HCL 100 MG PO TABS: 100.0000 mg | ORAL_TABLET | Freq: Every day | ORAL | Status: DC

## 2022-02-01 MED ORDER — NAPROXEN 500 MG PO TABS: 500.0000 mg | ORAL_TABLET | Freq: Two times a day (BID) | ORAL | 0 refills | Status: DC | PRN

## 2022-02-01 MED ORDER — THIAMINE HCL 100 MG/ML IJ SOLN
100.0000 mg | Freq: Every day | INTRAMUSCULAR | Status: DC
  Administered 2022-02-01: 100 mg via INTRAVENOUS
  Filled 2022-02-01: qty 2

## 2022-02-01 MED ORDER — IOHEXOL 300 MG/ML  SOLN
100.0000 mL | Freq: Once | INTRAMUSCULAR | Status: AC | PRN
  Administered 2022-02-01: 100 mL via INTRAVENOUS

## 2022-02-01 MED ORDER — LORAZEPAM 1 MG PO TABS: 0.0000 mg | ORAL_TABLET | Freq: Four times a day (QID) | ORAL | Status: DC

## 2022-02-01 MED ORDER — LORAZEPAM 2 MG/ML IJ SOLN: 0.0000 mg | Freq: Two times a day (BID) | INTRAMUSCULAR | Status: DC

## 2022-02-01 NOTE — Discharge Instructions (Addendum)
I am prescribing you an anti-inflammatory medication called naproxen.  This is similar to Aleve.  You can take this up to 2 times a day for management of your pain.  Try to take it with a small amount of food to help prevent stomach irritation. ? ?Please follow-up with your primary care provider regarding your symptoms as well as this visit today.  If you develop any new or worsening symptoms please come back to the emergency department. ?

## 2022-02-01 NOTE — ED Provider Notes (Signed)
?[facility] DEPT ?Provider Note ? ? ?CSN: [id number] ?Arrival date & time: 02/01/22  1803 ? ?  ? ?History ? ?Chief Complaint  ?Patient presents with  ? Abdominal Pain  ? ? ?Janet Ramos is a 61 y.o. female. ? ?HPI ?Patient is a 61 year old female who presents to the emergency department due to abdominal pain.  Patient states that her pain initially started about 2 weeks ago on the left side of her abdomen.  It resolved and is now on the right side of her abdomen.  Reports associated nausea and vomiting.  Also notes darker urine than normal.  She states that her pain worsened today so she called EMS.  Reports that she drinks about 12 beers per day.  States that she drank about 4 beers today.  She was given 200 mcg of fentanyl and 4 mg of Zofran in route. ?  ? ?Home Medications ?Prior to Admission medications   ?Medication Sig Start Date End Date Taking? Authorizing Provider  ?naproxen (NAPROSYN) 500 MG tablet Take 1 tablet (500 mg total) by mouth 2 (two) times daily as needed. 02/01/22  Yes Rayna Sexton, PA-C  ?atorvastatin (LIPITOR) 40 MG tablet Take 1 tablet (40 mg total) by mouth daily. ?Patient taking differently: Take 40 mg by mouth at bedtime. 09/19/21   Kerin Perna, NP  ?docusate sodium (COLACE) 100 MG capsule Take 1 capsule (100 mg total) by mouth 2 (two) times daily as needed for mild constipation. 10/10/21   Aletha Halim, MD  ?risperiDONE (RISPERDAL) 1 MG tablet TAKE 1 TABLET (1 MG TOTAL) BY MOUTH AT BEDTIME. 09/17/21 09/17/22  Kerin Perna, NP  ?   ? ?Allergies    ?Patient has no known allergies.   ? ?Review of Systems   ?Review of Systems  ?All other systems reviewed and are negative. ?Ten systems reviewed and are negative for acute change, except as noted in the HPI.   ?Physical Exam ?Updated Vital Signs ?BP (!) 138/102   Pulse 84   Temp 98.3 ?F (36.8 ?C) (Oral)   Resp 20   LMP 03/15/2011   SpO2 95%  ?Physical Exam ?Vitals and nursing note  reviewed.  ?Constitutional:   ?   General: She is not in acute distress. ?   Appearance: Normal appearance. She is not ill-appearing, toxic-appearing or diaphoretic.  ?HENT:  ?   Head: Normocephalic and atraumatic.  ?   Right Ear: External ear normal.  ?   Left Ear: External ear normal.  ?   Nose: Nose normal.  ?   Mouth/Throat:  ?   Mouth: Mucous membranes are moist.  ?   Pharynx: Oropharynx is clear. No oropharyngeal exudate or posterior oropharyngeal erythema.  ?Eyes:  ?   Extraocular Movements: Extraocular movements intact.  ?Cardiovascular:  ?   Rate and Rhythm: Normal rate and regular rhythm.  ?   Pulses: Normal pulses.  ?   Heart sounds: Normal heart sounds. No murmur heard. ?  No friction rub. No gallop.  ?Pulmonary:  ?   Effort: Pulmonary effort is normal. No respiratory distress.  ?   Breath sounds: Normal breath sounds. No stridor. No wheezing, rhonchi or rales.  ?Abdominal:  ?   General: Abdomen is flat.  ?   Palpations: Abdomen is soft.  ?   Tenderness: There is abdominal tenderness.  ?   Comments: Protuberant abdomen that is soft.  Moderate tenderness noted in the periumbilical region.  ?Musculoskeletal:     ?  General: Normal range of motion.  ?   Cervical back: Normal range of motion and neck supple. No tenderness.  ?Skin: ?   General: Skin is warm and dry.  ?Neurological:  ?   General: No focal deficit present.  ?   Mental Status: She is alert and oriented to person, place, and time.  ?Psychiatric:     ?   Mood and Affect: Mood normal.     ?   Behavior: Behavior normal.  ? ?ED Results / Procedures / Treatments   ?Labs ?(all labs ordered are listed, but only abnormal results are displayed) ?Labs Reviewed  ?CBC WITH DIFFERENTIAL/PLATELET - Abnormal; Notable for the following components:  ?    Result Value  ? HCT 35.7 (*)   ? MCHC 37.0 (*)   ? Platelets 141 (*)   ? All other components within normal limits  ?ETHANOL - Abnormal; Notable for the following components:  ? Alcohol, Ethyl (B) 65 (*)   ? All  other components within normal limits  ?URINALYSIS, ROUTINE W REFLEX MICROSCOPIC - Abnormal; Notable for the following components:  ? Color, Urine STRAW (*)   ? All other components within normal limits  ?COMPREHENSIVE METABOLIC PANEL  ?LIPASE, BLOOD  ? ?EKG ?None ? ?Radiology ?CT ABDOMEN PELVIS W CONTRAST ? ?Result Date: 02/01/2022 ?CLINICAL DATA:  Right flank pain. EXAM: CT ABDOMEN AND PELVIS WITH CONTRAST TECHNIQUE: Multidetector CT imaging of the abdomen and pelvis was performed using the standard protocol following bolus administration of intravenous contrast. RADIATION DOSE REDUCTION: This exam was performed according to the departmental dose-optimization program which includes automated exposure control, adjustment of the mA and/or kV according to patient size and/or use of iterative reconstruction technique. CONTRAST:  121m OMNIPAQUE IOHEXOL 300 MG/ML  SOLN COMPARISON:  June 06, 2021 FINDINGS: Lower chest: No acute abnormality. Hepatobiliary: There is diffuse fatty infiltration of the liver parenchyma. No focal liver abnormality is seen. No gallstones, gallbladder wall thickening, or biliary dilatation. Pancreas: Unremarkable. No pancreatic ductal dilatation or surrounding inflammatory changes. Spleen: Normal in size without focal abnormality. Adrenals/Urinary Tract: Adrenal glands are unremarkable. Kidneys are normal, without renal calculi, focal lesion, or hydronephrosis. Bladder is unremarkable. Stomach/Bowel: Stomach is within normal limits. Appendix appears normal. No evidence of bowel wall thickening, distention, or inflammatory changes. Noninflamed diverticula are seen throughout the large bowel. Vascular/Lymphatic: No significant vascular findings are present. No enlarged abdominal or pelvic lymph nodes. Reproductive: Uterus and bilateral adnexa are unremarkable. Other: No abdominal wall hernia or abnormality. No abdominopelvic ascites. Musculoskeletal: No acute or significant osseous findings.  IMPRESSION: 1. Hepatic steatosis. 2. Colonic diverticulosis. Electronically Signed   By: TVirgina NorfolkM.D.   On: 02/01/2022 19:50   ? ?Procedures ?Procedures  ? ?Medications Ordered in ED ?Medications  ?LORazepam (ATIVAN) injection 0-4 mg (1 mg Intravenous Given 02/01/22 1830)  ?  Or  ?LORazepam (ATIVAN) tablet 0-4 mg ( Oral See Alternative 02/01/22 1830)  ?LORazepam (ATIVAN) injection 0-4 mg (has no administration in time range)  ?  Or  ?LORazepam (ATIVAN) tablet 0-4 mg (has no administration in time range)  ?thiamine tablet 100 mg ( Oral See Alternative 02/01/22 1828)  ?  Or  ?thiamine (B-1) injection 100 mg (100 mg Intravenous Given 02/01/22 1828)  ?sodium chloride 0.9 % bolus 1,000 mL (0 mLs Intravenous Stopped 02/01/22 2030)  ?alum & mag hydroxide-simeth (MAALOX/MYLANTA) 200-200-20 MG/5ML suspension 30 mL (30 mLs Oral Given 02/01/22 1827)  ?  And  ?lidocaine (XYLOCAINE) 2 % viscous mouth solution 15  mL (15 mLs Oral Given 02/01/22 1827)  ?iohexol (OMNIPAQUE) 300 MG/ML solution 100 mL (100 mLs Intravenous Contrast Given 02/01/22 1923)  ?HYDROmorphone (DILAUDID) injection 1 mg (1 mg Intravenous Given 02/01/22 2028)  ? ?ED Course/ Medical Decision Making/ A&P ?  ?                        ?Medical Decision Making ?Amount and/or Complexity of Data Reviewed ?Labs: ordered. ?Radiology: ordered. ? ?Risk ?OTC drugs. ?Prescription drug management. ? ?Pt is a 61 y.o. female who presents to the emergency department with 2 weeks of abdominal pain. ? ?Labs: ?CBC with hematocrit of 35.7, MCHC of 37, platelets of 141. ?CMP within normal limits. ?Lipase of 26. ?Ethanol 65. ?UA within normal limits. ? ?Imaging: ?CT scan of the abdomen/pelvis shows hepatic steatosis.  Colonic diverticulosis. ? ?I, Rayna Sexton, PA-C, personally reviewed and evaluated these images and lab results as part of my medical decision-making. ? ?On my exam patient is afebrile, nontachycardic, and nontoxic-appearing.  Abdomen is soft.  Patient has moderate  tenderness in the periumbilical region.  Obtain basic lab work as well as a CT scan of the abdomen/pelvis which appears generally reassuring.  CT scan shows "hepatic steatosis.  Colonic diverticulosis."  CBC without

## 2022-02-01 NOTE — ED Notes (Signed)
Patient transported to CT 

## 2022-02-01 NOTE — ED Notes (Signed)
Pt ambulated to BR with tech, steady gait noted ?

## 2022-02-01 NOTE — ED Notes (Signed)
MD at the bedside  

## 2022-02-01 NOTE — ED Triage Notes (Signed)
BIB EMS from home, complains of R flank pain increasing for the past 3 days, now reports pain radiates to groin. EMS reports frequent uncontrolled dark urine. EMS reports ETOH on board, and now tremors (had 4 beers today).  ? ?20 G RFA, had 200 mcg Fentanyl, 4 mg Zofran  ? ? ?

## 2022-02-11 ENCOUNTER — Other Ambulatory Visit: Payer: Self-pay

## 2022-02-13 ENCOUNTER — Other Ambulatory Visit: Payer: Self-pay

## 2022-03-19 ENCOUNTER — Other Ambulatory Visit: Payer: Self-pay

## 2022-03-20 ENCOUNTER — Other Ambulatory Visit: Payer: Self-pay

## 2022-03-22 ENCOUNTER — Other Ambulatory Visit (INDEPENDENT_AMBULATORY_CARE_PROVIDER_SITE_OTHER): Payer: Self-pay | Admitting: Primary Care

## 2022-03-22 ENCOUNTER — Other Ambulatory Visit: Payer: Self-pay

## 2022-03-22 DIAGNOSIS — Z760 Encounter for issue of repeat prescription: Secondary | ICD-10-CM

## 2022-03-22 NOTE — Telephone Encounter (Signed)
Routed to PCP 

## 2022-03-22 NOTE — Telephone Encounter (Signed)
Medication Refill - Medication:  risperiDONE (RISPERDAL) 1 MG tablet   Has the patient contacted their pharmacy? Yes.   Contact PCP  Preferred Pharmacy (with phone number or street name):  [facility] [address] [place], [place] [address]  Phone:  [phone]  Fax:  [phone]  Has the patient been seen for an appointment in the last year OR does the patient have an upcoming appointment? Yes.    Agent: Please be advised that RX refills may take up to 3 business days. We ask that you follow-up with your pharmacy.

## 2022-03-22 NOTE — Telephone Encounter (Signed)
Requested medication (s) are due for refill today - yes  Requested medication (s) are on the active medication list -yes  Future visit scheduled -yes  Last refill: 09/17/22 #90 1RF  Notes to clinic: non delegated Rx  Requested Prescriptions  Pending Prescriptions Disp Refills   risperiDONE (RISPERDAL) 1 MG tablet 90 tablet 1    Sig: TAKE 1 TABLET (1 MG TOTAL) BY MOUTH AT BEDTIME.     Not Delegated - Psychiatry:  Antipsychotics - Second Generation (Atypical) - risperidone Failed - 03/22/2022  4:00 PM      Failed - This refill cannot be delegated      Failed - Lipid Panel in normal range within the last 12 months    Cholesterol, Total  Date Value Ref Range Status  01/23/2022 195 100 - 199 mg/dL Final   LDL Chol Calc (NIH)  Date Value Ref Range Status  01/23/2022 80 0 - 99 mg/dL Final   HDL  Date Value Ref Range Status  01/23/2022 102 >39 mg/dL Final   Triglycerides  Date Value Ref Range Status  01/23/2022 74 0 - 149 mg/dL Final         Passed - TSH in normal range and within 360 days    TSH  Date Value Ref Range Status  06/06/2021 3.002 0.350 - 4.500 uIU/mL Final    Comment:    Performed by a 3rd Generation assay with a functional sensitivity of <=0.01 uIU/mL. Performed at [facility], [place] [address], [place], [place] [address]   03/19/2021 1.180 0.450 - 4.500 uIU/mL Final         Passed - Completed PHQ-2 or PHQ-9 in the last 360 days      Passed - Last BP in normal range    BP Readings from Last 1 Encounters:  02/01/22 137/89         Passed - Last Heart Rate in normal range    Pulse Readings from Last 1 Encounters:  02/01/22 83         Passed - Valid encounter within last 6 months    Recent Outpatient Visits           1 month ago Hypokalemia   [place] Kerin Perna, NP   6 months ago Need for immunization against influenza   [place], [place], NP   9 months ago  Abnormal liver enzymes   [place] Kerin Perna, NP   1 year ago Thick sputum   [place] Kerin Perna, NP   1 year ago Routine general medical examination at a health care facility   [place], Michelle P, NP       Future Appointments             In 4 months Edwards, Milford Cage, NP [place] within normal limits and completed in the last 12 months    WBC  Date Value Ref Range Status  02/01/2022 9.7 4.0 - 10.5 K/uL Final   RBC  Date Value Ref Range Status  02/01/2022 3.94 3.87 - 5.11 MIL/uL Final   Hemoglobin  Date Value Ref Range Status  02/01/2022 13.2 12.0 - 15.0 g/dL Final  01/23/2022 15.2 11.1 - 15.9 g/dL Final   HCT  Date Value Ref Range Status  02/01/2022  35.7 (L) 36.0 - 46.0 % Final   Hematocrit  Date Value Ref Range Status  01/23/2022 43.3 34.0 - 46.6 % Final   MCHC  Date Value Ref Range Status  02/01/2022 37.0 (H) 30.0 - 36.0 g/dL Final   [facility]  Date Value Ref Range Status  02/01/2022 33.5 26.0 - 34.0 pg Final   MCV  Date Value Ref Range Status  02/01/2022 90.6 80.0 - 100.0 fL Final  01/23/2022 95 79 - 97 fL Final   No results found for: "PLTCOUNTKUC", "LABPLAT", "POCPLA" RDW  Date Value Ref Range Status  02/01/2022 12.6 11.5 - 15.5 % Final  01/23/2022 12.5 11.7 - 15.4 % Final         Passed - CMP within normal limits and completed in the last 12 months    Albumin  Date Value Ref Range Status  02/01/2022 3.7 3.5 - 5.0 g/dL Final  01/23/2022 4.5 3.8 - 4.9 g/dL Final   Alkaline Phosphatase  Date Value Ref Range Status  02/01/2022 92 38 - 126 U/L Final   ALT  Date Value Ref Range Status  02/01/2022 17 0 - 44 U/L Final   AST  Date Value Ref Range Status  02/01/2022 26 15 - 41 U/L Final   BUN  Date Value Ref Range Status  02/01/2022 7 6 - 20 mg/dL Final  01/23/2022 5 (L) 6 - 24 mg/dL Final    Calcium  Date Value Ref Range Status  02/01/2022 9.3 8.9 - 10.3 mg/dL Final   Calcium, Ion  Date Value Ref Range Status  05/07/2015 1.19 1.12 - 1.23 mmol/L Final   CO2  Date Value Ref Range Status  02/01/2022 23 22 - 32 mmol/L Final   TCO2  Date Value Ref Range Status  05/07/2015 19 0 - 100 mmol/L Final   Creatinine, Ser  Date Value Ref Range Status  02/01/2022 0.59 0.44 - 1.00 mg/dL Final   Glucose, Bld  Date Value Ref Range Status  02/01/2022 89 70 - 99 mg/dL Final    Comment:    Glucose reference range applies only to samples taken after fasting for at least 8 hours.   Glucose-Capillary  Date Value Ref Range Status  06/08/2021 138 (H) 70 - 99 mg/dL Final    Comment:    Glucose reference range applies only to samples taken after fasting for at least 8 hours.   Potassium  Date Value Ref Range Status  02/01/2022 3.8 3.5 - 5.1 mmol/L Final   Sodium  Date Value Ref Range Status  02/01/2022 138 135 - 145 mmol/L Final  01/23/2022 141 134 - 144 mmol/L Final   Total Bilirubin  Date Value Ref Range Status  02/01/2022 0.9 0.3 - 1.2 mg/dL Final   Bilirubin Total  Date Value Ref Range Status  01/23/2022 0.7 0.0 - 1.2 mg/dL Final   Protein, ur  Date Value Ref Range Status  02/01/2022 NEGATIVE NEGATIVE mg/dL Final   Total Protein  Date Value Ref Range Status  02/01/2022 7.7 6.5 - 8.1 g/dL Final  01/23/2022 8.0 6.0 - 8.5 g/dL Final   GFR calc Af Amer  Date Value Ref Range Status  05/03/2020 >60 >60 mL/min Final   eGFR  Date Value Ref Range Status  01/23/2022 104 >59 mL/min/1.73 Final   GFR, Estimated  Date Value Ref Range Status  02/01/2022 >60 >60 mL/min Final    Comment:    (NOTE) Calculated using the CKD-EPI Creatinine Equation (2021)  Requested Prescriptions  Pending Prescriptions Disp Refills   risperiDONE (RISPERDAL) 1 MG tablet 90 tablet 1    Sig: TAKE 1 TABLET (1 MG TOTAL) BY MOUTH AT BEDTIME.     Not Delegated -  Psychiatry:  Antipsychotics - Second Generation (Atypical) - risperidone Failed - 03/22/2022  4:00 PM      Failed - This refill cannot be delegated      Failed - Lipid Panel in normal range within the last 12 months    Cholesterol, Total  Date Value Ref Range Status  01/23/2022 195 100 - 199 mg/dL Final   LDL Chol Calc (NIH)  Date Value Ref Range Status  01/23/2022 80 0 - 99 mg/dL Final   HDL  Date Value Ref Range Status  01/23/2022 102 >39 mg/dL Final   Triglycerides  Date Value Ref Range Status  01/23/2022 74 0 - 149 mg/dL Final         Passed - TSH in normal range and within 360 days    TSH  Date Value Ref Range Status  06/06/2021 3.002 0.350 - 4.500 uIU/mL Final    Comment:    Performed by a 3rd Generation assay with a functional sensitivity of <=0.01 uIU/mL. Performed at [facility], [place] [address] [place], [place] [address]   03/19/2021 1.180 0.450 - 4.500 uIU/mL Final         Passed - Completed PHQ-2 or PHQ-9 in the last 360 days      Passed - Last BP in normal range    BP Readings from Last 1 Encounters:  02/01/22 137/89         Passed - Last Heart Rate in normal range    Pulse Readings from Last 1 Encounters:  02/01/22 83         Passed - Valid encounter within last 6 months    Recent Outpatient Visits           1 month ago Hypokalemia   [place] Kerin Perna, NP   6 months ago Need for immunization against influenza   [place], [place], NP   9 months ago Abnormal liver enzymes   [place] Kerin Perna, NP   1 year ago Thick sputum   [place] Kerin Perna, NP   1 year ago Routine general medical examination at a health care facility   [place], Michelle P, NP       Future Appointments             In 4 months Edwards, Milford Cage, NP [place] within normal limits and completed in the last 12 months    WBC  Date Value Ref Range Status  02/01/2022 9.7 4.0 - 10.5 K/uL Final   RBC  Date Value Ref Range Status  02/01/2022 3.94 3.87 - 5.11 MIL/uL Final   Hemoglobin  Date Value Ref Range Status  02/01/2022 13.2 12.0 - 15.0 g/dL Final  01/23/2022 15.2 11.1 - 15.9 g/dL Final   HCT  Date Value Ref Range Status  02/01/2022 35.7 (L) 36.0 - 46.0 % Final   Hematocrit  Date Value Ref Range Status  01/23/2022 43.3 34.0 - 46.6 % Final   MCHC  Date Value Ref Range Status  02/01/2022 37.0 (H) 30.0 -  36.0 g/dL Final   [facility]  Date Value Ref Range Status  02/01/2022 33.5 26.0 - 34.0 pg Final   MCV  Date Value Ref Range Status  02/01/2022 90.6 80.0 - 100.0 fL Final  01/23/2022 95 79 - 97 fL Final   No results found for: "PLTCOUNTKUC", "LABPLAT", "POCPLA" RDW  Date Value Ref Range Status  02/01/2022 12.6 11.5 - 15.5 % Final  01/23/2022 12.5 11.7 - 15.4 % Final         Passed - CMP within normal limits and completed in the last 12 months    Albumin  Date Value Ref Range Status  02/01/2022 3.7 3.5 - 5.0 g/dL Final  01/23/2022 4.5 3.8 - 4.9 g/dL Final   Alkaline Phosphatase  Date Value Ref Range Status  02/01/2022 92 38 - 126 U/L Final   ALT  Date Value Ref Range Status  02/01/2022 17 0 - 44 U/L Final   AST  Date Value Ref Range Status  02/01/2022 26 15 - 41 U/L Final   BUN  Date Value Ref Range Status  02/01/2022 7 6 - 20 mg/dL Final  01/23/2022 5 (L) 6 - 24 mg/dL Final   Calcium  Date Value Ref Range Status  02/01/2022 9.3 8.9 - 10.3 mg/dL Final   Calcium, Ion  Date Value Ref Range Status  05/07/2015 1.19 1.12 - 1.23 mmol/L Final   CO2  Date Value Ref Range Status  02/01/2022 23 22 - 32 mmol/L Final   TCO2  Date Value Ref Range Status  05/07/2015 19 0 - 100 mmol/L Final   Creatinine, Ser  Date Value Ref Range Status  02/01/2022 0.59 0.44 - 1.00 mg/dL Final    Glucose, Bld  Date Value Ref Range Status  02/01/2022 89 70 - 99 mg/dL Final    Comment:    Glucose reference range applies only to samples taken after fasting for at least 8 hours.   Glucose-Capillary  Date Value Ref Range Status  06/08/2021 138 (H) 70 - 99 mg/dL Final    Comment:    Glucose reference range applies only to samples taken after fasting for at least 8 hours.   Potassium  Date Value Ref Range Status  02/01/2022 3.8 3.5 - 5.1 mmol/L Final   Sodium  Date Value Ref Range Status  02/01/2022 138 135 - 145 mmol/L Final  01/23/2022 141 134 - 144 mmol/L Final   Total Bilirubin  Date Value Ref Range Status  02/01/2022 0.9 0.3 - 1.2 mg/dL Final   Bilirubin Total  Date Value Ref Range Status  01/23/2022 0.7 0.0 - 1.2 mg/dL Final   Protein, ur  Date Value Ref Range Status  02/01/2022 NEGATIVE NEGATIVE mg/dL Final   Total Protein  Date Value Ref Range Status  02/01/2022 7.7 6.5 - 8.1 g/dL Final  01/23/2022 8.0 6.0 - 8.5 g/dL Final   GFR calc Af Amer  Date Value Ref Range Status  05/03/2020 >60 >60 mL/min Final   eGFR  Date Value Ref Range Status  01/23/2022 104 >59 mL/min/1.73 Final   GFR, Estimated  Date Value Ref Range Status  02/01/2022 >60 >60 mL/min Final    Comment:    (NOTE) Calculated using the CKD-EPI Creatinine Equation (2021)

## 2022-03-25 ENCOUNTER — Other Ambulatory Visit: Payer: Self-pay

## 2022-03-25 ENCOUNTER — Telehealth (INDEPENDENT_AMBULATORY_CARE_PROVIDER_SITE_OTHER): Payer: Self-pay | Admitting: Primary Care

## 2022-03-25 NOTE — Telephone Encounter (Signed)
Patient aware that records indicate she still has 60 tablets available to fill. She will call pharmacy. But she will not have enough to last until her next appointment so she needs additional refills sent to [place].

## 2022-03-25 NOTE — Telephone Encounter (Signed)
Copied from [place] [phone]. Topic: General - Other >> Mar 25, 2022 12:47 PM Everette C wrote: Reason for CRM: The patient has called to follow up on their request for Rx #: [id number]  risperiDONE (RISPERDAL) 1 MG tablet [[id number]]   Please contact further when possible

## 2022-03-26 ENCOUNTER — Other Ambulatory Visit: Payer: Self-pay

## 2022-03-26 NOTE — Telephone Encounter (Signed)
Sent back to CMA to document after speaking with pharmacy about insurance.

## 2022-03-27 ENCOUNTER — Other Ambulatory Visit: Payer: Self-pay

## 2022-03-27 MED ORDER — RISPERIDONE 1 MG PO TABS
ORAL_TABLET | Freq: Every day | ORAL | 1 refills | Status: DC
  Filled 2022-03-27: qty 90, fill #0
  Filled 2022-04-23: qty 30, 30d supply, fill #0
  Filled 2022-06-10: qty 30, 30d supply, fill #1
  Filled 2022-07-11: qty 30, 30d supply, fill #2
  Filled 2022-08-19: qty 30, 30d supply, fill #3
  Filled 2022-09-18: qty 30, 30d supply, fill #4
  Filled 2022-10-24: qty 30, 30d supply, fill #5

## 2022-03-27 NOTE — Telephone Encounter (Signed)
Spoke with pharmacy. Patient insurance will only cover 2 fills in 365 days. She can get refills but will have to pay out of pocket. Made patient aware.

## 2022-04-23 ENCOUNTER — Other Ambulatory Visit: Payer: Self-pay

## 2022-04-24 ENCOUNTER — Other Ambulatory Visit: Payer: Self-pay

## 2022-04-26 ENCOUNTER — Other Ambulatory Visit: Payer: Self-pay

## 2022-05-25 ENCOUNTER — Emergency Department (HOSPITAL_COMMUNITY): Payer: Medicaid Other

## 2022-05-25 ENCOUNTER — Other Ambulatory Visit: Payer: Self-pay

## 2022-05-25 ENCOUNTER — Encounter (HOSPITAL_COMMUNITY): Payer: Self-pay

## 2022-05-25 ENCOUNTER — Emergency Department (HOSPITAL_COMMUNITY)
Admission: EM | Admit: 2022-05-25 | Discharge: 2022-05-25 | Disposition: A | Discharge status: Home / Self Care | Payer: Medicaid Other | Attending: Emergency Medicine | Admitting: Emergency Medicine

## 2022-05-25 DIAGNOSIS — R1031 Right lower quadrant pain: Secondary | ICD-10-CM | POA: Diagnosis present

## 2022-05-25 DIAGNOSIS — R1011 Right upper quadrant pain: Secondary | ICD-10-CM | POA: Diagnosis not present

## 2022-05-25 LAB — CBC WITH DIFFERENTIAL/PLATELET
Abs Immature Granulocytes: 0.04 10*3/uL (ref 0.00–0.07)
Basophils Absolute: 0 10*3/uL (ref 0.0–0.1)
Basophils Relative: 0 %
Eosinophils Absolute: 0.1 10*3/uL (ref 0.0–0.5)
Eosinophils Relative: 1 %
HCT: 37.4 % (ref 36.0–46.0)
Hemoglobin: 13.8 g/dL (ref 12.0–15.0)
Immature Granulocytes: 0 %
Lymphocytes Relative: 24 %
Lymphs Abs: 2.2 10*3/uL (ref 0.7–4.0)
MCH: 33 pg (ref 26.0–34.0)
MCHC: 36.9 g/dL — ABNORMAL HIGH (ref 30.0–36.0)
MCV: 89.5 fL (ref 80.0–100.0)
Monocytes Absolute: 0.9 10*3/uL (ref 0.1–1.0)
Monocytes Relative: 9 %
Neutro Abs: 6 10*3/uL (ref 1.7–7.7)
Neutrophils Relative %: 66 %
Platelets: DECREASED 10*3/uL (ref 150–400)
RBC: 4.18 MIL/uL (ref 3.87–5.11)
RDW: 13.6 % (ref 11.5–15.5)
WBC: 9.2 10*3/uL (ref 4.0–10.5)
nRBC: 0 % (ref 0.0–0.2)

## 2022-05-25 LAB — HEPATIC FUNCTION PANEL
ALT: 23 U/L (ref 0–44)
AST: 28 U/L (ref 15–41)
Albumin: 3.9 g/dL (ref 3.5–5.0)
Alkaline Phosphatase: 88 U/L (ref 38–126)
Bilirubin, Direct: 0.2 mg/dL (ref 0.0–0.2)
Indirect Bilirubin: 0.6 mg/dL (ref 0.3–0.9)
Total Bilirubin: 0.8 mg/dL (ref 0.3–1.2)
Total Protein: 8.3 g/dL — ABNORMAL HIGH (ref 6.5–8.1)

## 2022-05-25 LAB — BASIC METABOLIC PANEL
Anion gap: 11 (ref 5–15)
BUN: 8 mg/dL (ref 6–20)
CO2: 22 mmol/L (ref 22–32)
Calcium: 9.2 mg/dL (ref 8.9–10.3)
Chloride: 100 mmol/L (ref 98–111)
Creatinine, Ser: 0.62 mg/dL (ref 0.44–1.00)
GFR, Estimated: >60 mL/min (ref >60)
Glucose, Bld: 105 mg/dL — ABNORMAL HIGH (ref 70–99)
Potassium: 3.6 mmol/L (ref 3.5–5.1)
Sodium: 133 mmol/L — ABNORMAL LOW (ref 135–145)

## 2022-05-25 LAB — URINALYSIS, ROUTINE W REFLEX MICROSCOPIC
Bilirubin Urine: NEGATIVE
Glucose, UA: NEGATIVE mg/dL
Hgb urine dipstick: NEGATIVE
Ketones, ur: NEGATIVE mg/dL
Leukocytes,Ua: NEGATIVE
Nitrite: NEGATIVE
Protein, ur: NEGATIVE mg/dL
Specific Gravity, Urine: 1.006 (ref 1.005–1.030)
pH: 5 (ref 5.0–8.0)

## 2022-05-25 LAB — LIPASE, BLOOD: Lipase: 30 U/L (ref 11–51)

## 2022-05-25 MED ORDER — OXYCODONE-ACETAMINOPHEN 5-325 MG PO TABS
1.0000 | ORAL_TABLET | Freq: Four times a day (QID) | ORAL | 0 refills | Status: DC | PRN
  Filled 2022-05-25: qty 6, 2d supply, fill #0

## 2022-05-25 MED ORDER — NAPROXEN 375 MG PO TABS
375.0000 mg | ORAL_TABLET | Freq: Two times a day (BID) | ORAL | 0 refills | Status: DC
  Filled 2022-05-25: qty 20, 10d supply, fill #0

## 2022-05-25 MED ORDER — NAPROXEN 375 MG PO TABS: 375.0000 mg | ORAL_TABLET | Freq: Two times a day (BID) | ORAL | 0 refills | Status: DC

## 2022-05-25 MED ORDER — ONDANSETRON 8 MG PO TBDP
8.0000 mg | ORAL_TABLET | Freq: Three times a day (TID) | ORAL | 0 refills | Status: DC | PRN
  Filled 2022-05-25: qty 20, 7d supply, fill #0

## 2022-05-25 MED ORDER — ALUM & MAG HYDROXIDE-SIMETH 200-200-20 MG/5ML PO SUSP
30.0000 mL | Freq: Once | ORAL | Status: AC
  Administered 2022-05-25: 30 mL via ORAL
  Filled 2022-05-25: qty 30

## 2022-05-25 MED ORDER — OXYCODONE HCL 5 MG PO TABS
10.0000 mg | ORAL_TABLET | Freq: Once | ORAL | Status: AC
  Administered 2022-05-25: 10 mg via ORAL
  Filled 2022-05-25: qty 2

## 2022-05-25 MED ORDER — OXYCODONE-ACETAMINOPHEN 5-325 MG PO TABS: 1.0000 | ORAL_TABLET | Freq: Four times a day (QID) | ORAL | 0 refills | Status: DC | PRN

## 2022-05-25 MED ORDER — ONDANSETRON 8 MG PO TBDP: 8.0000 mg | ORAL_TABLET | Freq: Three times a day (TID) | ORAL | 0 refills | Status: DC | PRN

## 2022-05-25 NOTE — Discharge Instructions (Addendum)
We saw you in the ER for abdominal pain. All the results in the ER are normal, labs and imaging.  CT scan of your abdomen is reassuring. We are not sure what is causing your symptoms. The workup in the ER is not complete, and is limited to screening for life threatening and emergent conditions only, so please see a primary care doctor for further evaluation.  Please return to the ER if your symptoms worsen; you have increased pain, fevers, chills, inability to keep any medications down, confusion. Otherwise see the outpatient doctor as requested.

## 2022-05-25 NOTE — ED Provider Notes (Signed)
[facility] DEPT Provider Note   CSN: [id number] Arrival date & time: 05/25/22  1747     History  Chief Complaint  Patient presents with   Back Pain   Hip Pain    Janet Ramos is a 61 y.o. female.  HPI    61 year old female comes in with chief complaint of abdominal pain.  Patient has history of alcohol use disorder, previous history of cocaine use, tobacco use disorder who comes in with chief complaint of lower quadrant abdominal pain.  Patient denies any abdominal surgical history.  She states that for the last 3 days she has been having abdominal pain over the right side of her abdomen.  Pain is radiating towards her back.  She denies any associated burning with urination, blood in the urine, urinary frequency, vaginal discharge, vaginal bleeding.  Patient has some nausea without vomiting.  She denies any diarrhea.  Pain is also radiating towards her hip.  She denies any midline back pain.  No history of chronic back issues.    Home Medications Prior to Admission medications   Medication Sig Start Date End Date Taking? Authorizing Provider  atorvastatin (LIPITOR) 40 MG tablet Take 1 tablet (40 mg total) by mouth daily. Patient taking differently: Take 40 mg by mouth at bedtime. 09/19/21  Yes Kerin Perna, NP  ondansetron (ZOFRAN-ODT) 8 MG disintegrating tablet Take 1 tablet (8 mg total) by mouth every 8 (eight) hours as needed for nausea. 05/25/22  Yes Raylin Winer, MD  risperiDONE (RISPERDAL) 1 MG tablet TAKE 1 TABLET (1 MG TOTAL) BY MOUTH ONCE NIGHTLY AT BEDTIME. Patient taking differently: Take 1 mg by mouth at bedtime. 03/27/22  Yes Kerin Perna, NP  docusate sodium (COLACE) 100 MG capsule Take 1 capsule (100 mg total) by mouth 2 (two) times daily as needed for mild constipation. Patient not taking: Reported on 05/25/2022 10/10/21   Aletha Halim, MD  naproxen (NAPROSYN) 500 MG tablet Take 1 tablet (500 mg total) by  mouth 2 (two) times daily as needed. Patient not taking: Reported on 05/25/2022 02/01/22   Rayna Sexton, PA-C      Allergies    Patient has no known allergies.    Review of Systems   Review of Systems  All other systems reviewed and are negative.   Physical Exam Updated Vital Signs BP 93/63 (BP Location: Right Arm)   Pulse 86   Temp 98 F (36.7 C) (Oral)   Resp 18   Ht '5\' 4"'$  (1.626 m)   Wt 79.4 kg   LMP 03/15/2011   SpO2 100%   BMI 30.04 kg/m  Physical Exam Vitals and nursing note reviewed.  Constitutional:      Appearance: She is well-developed.  HENT:     Head: Atraumatic.  Eyes:     Extraocular Movements: Extraocular movements intact.     Pupils: Pupils are equal, round, and reactive to light.  Cardiovascular:     Rate and Rhythm: Normal rate.  Pulmonary:     Effort: Pulmonary effort is normal.  Abdominal:     Tenderness: There is abdominal tenderness.     Comments: Patient has tenderness over the right side of the abdomen both upper and lower and also in the right flank region.  No associated rash.  Positive guarding, no rebound  Musculoskeletal:     Cervical back: Normal range of motion and neck supple.  Skin:    General: Skin is warm and dry.  Neurological:  Mental Status: She is alert and oriented to person, place, and time.     ED Results / Procedures / Treatments   Labs (all labs ordered are listed, but only abnormal results are displayed) Labs Reviewed  BASIC METABOLIC PANEL - Abnormal; Notable for the following components:      Result Value   Sodium 133 (*)    Glucose, Bld 105 (*)    All other components within normal limits  CBC WITH DIFFERENTIAL/PLATELET - Abnormal; Notable for the following components:   MCHC 36.9 (*)    All other components within normal limits  HEPATIC FUNCTION PANEL - Abnormal; Notable for the following components:   Total Protein 8.3 (*)    All other components within normal limits  URINALYSIS, ROUTINE W REFLEX  MICROSCOPIC  LIPASE, BLOOD    EKG None  Radiology CT Renal Stone Study  Result Date: 05/25/2022 CLINICAL DATA:  Flank pain. EXAM: CT ABDOMEN AND PELVIS WITHOUT CONTRAST. CT LUMBAR SPINE WITHOUT CONTRAST. TECHNIQUE: Multidetector CT imaging of the abdomen and pelvis was performed following the standard protocol without IV contrast. ADDITIONAL AXIAL, CORONAL AND SAGITTAL REFORMATS OF THE LUMBAR SPINE WERE PERFORMED. RADIATION DOSE REDUCTION: This exam was performed according to the departmental dose-optimization program which includes automated exposure control, adjustment of the mA and/or kV according to patient size and/or use of iterative reconstruction technique. COMPARISON:  CT abdomen and pelvis 02/01/2022 FINDINGS ABDOMEN AND PELVIS: Lower chest: No acute abnormality. Hepatobiliary: No focal liver abnormality is seen. No gallstones, gallbladder wall thickening, or biliary dilatation. Pancreas: Unremarkable. No pancreatic ductal dilatation or surrounding inflammatory changes. Spleen: Normal in size without focal abnormality. Adrenals/Urinary Tract: Adrenal glands are unremarkable. Kidneys are normal, without renal calculi, focal lesion, or hydronephrosis. Bladder is unremarkable. Stomach/Bowel: Stomach is within normal limits. Appendix appears normal. No evidence of bowel wall thickening, distention, or inflammatory changes. There is diffuse colonic diverticulosis. Vascular/Lymphatic: No significant vascular findings are present. No enlarged abdominal or pelvic lymph nodes. Reproductive: Uterus and bilateral adnexa are unremarkable. Other: Small fat containing right inguinal hernia.  No ascites. Musculoskeletal: No acute or significant osseous findings. FINDINGS LUMBAR SPINE: Alignment: There is 2 mm of anterolisthesis at L4-L5 which is favored is degenerative. Alignment is otherwise anatomic. Osseous: No acute fractures are identified. No focal osseous lesion. Soft tissues: Paravertebral soft tissues  appear within normal limits. Mild nonspecific subcutaneous lumbar edema. Disc levels: Disc spaces are preserved. Mild degenerative endplate osteophytes are seen throughout the lumbar spine. Other: There is prominence of epidural fat diffusely from the level of L2 through the sacrum. This causes mild diffuse central spinal canal stenosis. Disc bulge at L4-L5 causes moderate bilateral neural foraminal stenosis. There has been no significant interval change. IMPRESSION: 1. No acute localizing process in the abdomen or pelvis. 2. Colonic diverticulosis. 3. No acute localizing process in the lumbar spine. 4. Mild diffuse degenerative changes of the lumbar spine and other nonacute findings as above. Electronically Signed   By: Ronney Asters M.D.   On: 05/25/2022 19:27   CT L-SPINE NO CHARGE  Result Date: 05/25/2022 CLINICAL DATA:  Flank pain. EXAM: CT ABDOMEN AND PELVIS WITHOUT CONTRAST. CT LUMBAR SPINE WITHOUT CONTRAST. TECHNIQUE: Multidetector CT imaging of the abdomen and pelvis was performed following the standard protocol without IV contrast. ADDITIONAL AXIAL, CORONAL AND SAGITTAL REFORMATS OF THE LUMBAR SPINE WERE PERFORMED. RADIATION DOSE REDUCTION: This exam was performed according to the departmental dose-optimization program which includes automated exposure control, adjustment of the mA and/or kV according  to patient size and/or use of iterative reconstruction technique. COMPARISON:  CT abdomen and pelvis 02/01/2022 FINDINGS ABDOMEN AND PELVIS: Lower chest: No acute abnormality. Hepatobiliary: No focal liver abnormality is seen. No gallstones, gallbladder wall thickening, or biliary dilatation. Pancreas: Unremarkable. No pancreatic ductal dilatation or surrounding inflammatory changes. Spleen: Normal in size without focal abnormality. Adrenals/Urinary Tract: Adrenal glands are unremarkable. Kidneys are normal, without renal calculi, focal lesion, or hydronephrosis. Bladder is unremarkable. Stomach/Bowel:  Stomach is within normal limits. Appendix appears normal. No evidence of bowel wall thickening, distention, or inflammatory changes. There is diffuse colonic diverticulosis. Vascular/Lymphatic: No significant vascular findings are present. No enlarged abdominal or pelvic lymph nodes. Reproductive: Uterus and bilateral adnexa are unremarkable. Other: Small fat containing right inguinal hernia.  No ascites. Musculoskeletal: No acute or significant osseous findings. FINDINGS LUMBAR SPINE: Alignment: There is 2 mm of anterolisthesis at L4-L5 which is favored is degenerative. Alignment is otherwise anatomic. Osseous: No acute fractures are identified. No focal osseous lesion. Soft tissues: Paravertebral soft tissues appear within normal limits. Mild nonspecific subcutaneous lumbar edema. Disc levels: Disc spaces are preserved. Mild degenerative endplate osteophytes are seen throughout the lumbar spine. Other: There is prominence of epidural fat diffusely from the level of L2 through the sacrum. This causes mild diffuse central spinal canal stenosis. Disc bulge at L4-L5 causes moderate bilateral neural foraminal stenosis. There has been no significant interval change. IMPRESSION: 1. No acute localizing process in the abdomen or pelvis. 2. Colonic diverticulosis. 3. No acute localizing process in the lumbar spine. 4. Mild diffuse degenerative changes of the lumbar spine and other nonacute findings as above. Electronically Signed   By: Ronney Asters M.D.   On: 05/25/2022 19:27    Procedures Procedures    Medications Ordered in ED Medications  oxyCODONE (Oxy IR/ROXICODONE) immediate release tablet 10 mg (10 mg Oral Given 05/25/22 2150)  alum & mag hydroxide-simeth (MAALOX/MYLANTA) 200-200-20 MG/5ML suspension 30 mL (30 mLs Oral Given 05/25/22 2150)    ED Course/ Medical Decision Making/ A&P                           Medical Decision Making Amount and/or Complexity of Data Reviewed Labs: ordered.  Risk OTC  drugs. Prescription drug management.   This patient presents to the ED with chief complaint(s) of right-sided abdominal pain radiating to the back and towards her hip with pertinent past medical history of regular alcohol consumption, previous history of cocaine use per our records and tobacco use disorder.  Patient has no abdominal surgical history. The complaint involves an extensive differential diagnosis and also carries with it a high risk of complications and morbidity.    Patient was evaluated in triage.  Basic labs, urinalysis and CT renal stone was ordered.  The differential diagnosis based on my evaluation includes : Appendicitis, ureteral colic, cholecystitis, cholelithiasis, abdominal hernia. Pain has been constant for the last 3 days without any UTI-like symptoms or vaginal discharge or bleeding.  Patient denies high risk for STI.  Low suspicion for GU pathology, but large ovarian cyst and tumor were considered in the differential.   The initial plan is to focus on pain control.  CT renal stone was ordered in triage.  Independently visualized and interpreted.  CT scan does not show any evidence of free air.   Additional history obtained: Additional history obtained from spouse Records reviewed  previous CT scan from April - neg for acute changes.  Previous lab  results  Independent labs interpretation:  The following labs were independently interpreted: CBC, metabolic profile, LFTs, urine analysis are all reassuring.  Independent visualization of imaging: - I independently visualized the following imaging with scope of interpretation limited to determining acute life threatening conditions related to emergency care: CT abdomen pelvis without contrast, no free air  Treatment and Reassessment: Patient was given oral oxycodone, she feels better, but not completely pain-free.  Repeat exam completed.  We discussed patient's lab results findings, CT scan findings.  She is still denies  any urinary symptoms.  Plan is for patient to try pain medications at home along with simplifying her diet and avoiding alcohol and smoking.  If patient's pain gets worse, she will return to the ER.  Both the patient and husband are in agreement with the plan.  Final Clinical Impression(s) / ED Diagnoses Final diagnoses:  Right lower quadrant abdominal pain    Rx / DC Orders ED Discharge Orders          Ordered    ondansetron (ZOFRAN-ODT) 8 MG disintegrating tablet  Every 8 hours PRN        05/25/22 2238              Varney Biles, MD 05/25/22 2252

## 2022-05-25 NOTE — ED Triage Notes (Signed)
Pt arrives with c/o right sided back and hip pain that started about 3 days ago. Pt denies sciatica.

## 2022-05-25 NOTE — ED Provider Triage Note (Signed)
Emergency Medicine Provider Triage Evaluation Note  Janet Ramos , a 61 y.o. female  was evaluated in triage.  Pt complains of "my right kidney hurts".  Started 2 days ago, denies dysuria or hematuria.  No recent falls or injuries, saddle anesthesia, bilateral lower extremity numbness or urinary retention.  No history of kidney stones.  Has not taken anything for pain, worse with movement..  Review of Systems  Per HPI I  Physical Exam  BP 109/77 (BP Location: Right Arm)   Pulse 93   Temp 97.7 F (36.5 C) (Oral)   Resp 18   Ht '5\' 4"'$  (1.626 m)   Wt 79.4 kg   LMP 03/15/2011   SpO2 97%   BMI 30.04 kg/m  Gen:   Awake, no distress   Resp:  Normal effort  MSK:   Moves extremities without difficulty  Other:  Right CVA tenderness  Medical Decision Making  Medically screening exam initiated at 6:00 PM.  Appropriate orders placed.  Latese Dufault was informed that the remainder of the evaluation will be completed by another provider, this initial triage assessment does not replace that evaluation, and the importance of remaining in the ED until their evaluation is complete.     Sherrill Raring, PA-C 05/25/22 1800

## 2022-05-26 ENCOUNTER — Telehealth (HOSPITAL_COMMUNITY): Payer: Self-pay | Admitting: Emergency Medicine

## 2022-05-26 MED ORDER — NAPROXEN 375 MG PO TABS: 375.0000 mg | ORAL_TABLET | Freq: Two times a day (BID) | ORAL | 0 refills | Status: DC

## 2022-05-26 MED ORDER — OXYCODONE-ACETAMINOPHEN 5-325 MG PO TABS: 1.0000 | ORAL_TABLET | Freq: Four times a day (QID) | ORAL | 0 refills | Status: DC | PRN

## 2022-05-26 MED ORDER — ONDANSETRON 4 MG PO TBDP: 4.0000 mg | ORAL_TABLET | Freq: Three times a day (TID) | ORAL | 0 refills | Status: DC | PRN

## 2022-05-26 NOTE — Telephone Encounter (Cosign Needed)
Patient's medications did not arrive at the pharmacy.  They requested a change to the [facility] off [address].  I have sent her prescriptions of naproxen, Zofran, and oxycodone to that pharmacy.

## 2022-05-26 NOTE — ED Notes (Signed)
Pt called due to not having medications at the pharmacy when went to pick up. Chart opened to verify pharmacy and meds. Kae Heller PA sent prescription to [facility] on [address].

## 2022-05-27 ENCOUNTER — Other Ambulatory Visit: Payer: Self-pay

## 2022-06-07 ENCOUNTER — Other Ambulatory Visit (INDEPENDENT_AMBULATORY_CARE_PROVIDER_SITE_OTHER): Payer: Self-pay | Admitting: Primary Care

## 2022-06-07 DIAGNOSIS — Z760 Encounter for issue of repeat prescription: Secondary | ICD-10-CM

## 2022-06-07 NOTE — Telephone Encounter (Signed)
Medication Refill - Medication:  risperiDONE (RISPERDAL) 1 MG tablet  Has the patient contacted their pharmacy? Yes.    Preferred Pharmacy (with phone number or street name):  [facility] [phone] - [place], [address] Phone:  [phone]  Fax:  [phone]     Has the patient been seen for an appointment in the last year OR does the patient have an upcoming appointment? Yes.    Agent: Please be advised that RX refills may take up to 3 business days. We ask that you follow-up with your pharmacy.

## 2022-06-07 NOTE — Telephone Encounter (Signed)
Requested medication (s) are due for refill today: no  Requested medication (s) are on the active medication list: yes  Last refill:  03/27/22 #90 1 RF  Future visit scheduled: yes  Notes to clinic:  pt requesting med sent to a different pharmacy   Requested Prescriptions  Pending Prescriptions Disp Refills   risperiDONE (RISPERDAL) 1 MG tablet 90 tablet 1    Sig: TAKE 1 TABLET (1 MG TOTAL) BY MOUTH ONCE NIGHTLY AT BEDTIME.     Not Delegated - Psychiatry:  Antipsychotics - Second Generation (Atypical) - risperidone Failed - 06/07/2022  2:14 PM      Failed - This refill cannot be delegated      Failed - TSH in normal range and within 360 days    TSH  Date Value Ref Range Status  06/06/2021 3.002 0.350 - 4.500 uIU/mL Final    Comment:    Performed by a 3rd Generation assay with a functional sensitivity of <=0.01 uIU/mL. Performed at [facility], [place] [address] [place], [place] [address]   03/19/2021 1.180 0.450 - 4.500 uIU/mL Final         Failed - Lipid Panel in normal range within the last 12 months    Cholesterol, Total  Date Value Ref Range Status  01/23/2022 195 100 - 199 mg/dL Final   LDL Chol Calc (NIH)  Date Value Ref Range Status  01/23/2022 80 0 - 99 mg/dL Final   HDL  Date Value Ref Range Status  01/23/2022 102 >39 mg/dL Final   Triglycerides  Date Value Ref Range Status  01/23/2022 74 0 - 149 mg/dL Final         Failed - CBC within normal limits and completed in the last 12 months    WBC  Date Value Ref Range Status  05/25/2022 9.2 4.0 - 10.5 K/uL Final   RBC  Date Value Ref Range Status  05/25/2022 4.18 3.87 - 5.11 MIL/uL Final   Hemoglobin  Date Value Ref Range Status  05/25/2022 13.8 12.0 - 15.0 g/dL Final  01/23/2022 15.2 11.1 - 15.9 g/dL Final   HCT  Date Value Ref Range Status  05/25/2022 37.4 36.0 - 46.0 % Final   Hematocrit  Date Value Ref Range Status  01/23/2022 43.3 34.0 - 46.6 % Final   MCHC  Date Value  Ref Range Status  05/25/2022 36.9 (H) 30.0 - 36.0 g/dL Final   [facility]  Date Value Ref Range Status  05/25/2022 33.0 26.0 - 34.0 pg Final   MCV  Date Value Ref Range Status  05/25/2022 89.5 80.0 - 100.0 fL Final  01/23/2022 95 79 - 97 fL Final   No results found for: "PLTCOUNTKUC", "LABPLAT", "POCPLA" RDW  Date Value Ref Range Status  05/25/2022 13.6 11.5 - 15.5 % Final  01/23/2022 12.5 11.7 - 15.4 % Final         Passed - Completed PHQ-2 or PHQ-9 in the last 360 days      Passed - Last BP in normal range    BP Readings from Last 1 Encounters:  05/25/22 93/63         Passed - Last Heart Rate in normal range    Pulse Readings from Last 1 Encounters:  05/25/22 86         Passed - Valid encounter within last 6 months    Recent Outpatient Visits           4 months ago Hypokalemia   [facility] Kerin Perna, NP   8 months ago Need for immunization against influenza   [place], Albert [place], NP   12 months ago Abnormal liver enzymes   [place] Kerin Perna, NP   1 year ago Thick sputum   [place] Kerin Perna, NP   1 year ago Routine general medical examination at a health care facility   [place], Michelle P, NP       Future Appointments             In 1 month Edwards, Milford Cage, NP [place] within normal limits and completed in the last 12 months    Albumin  Date Value Ref Range Status  05/25/2022 3.9 3.5 - 5.0 g/dL Final  01/23/2022 4.5 3.8 - 4.9 g/dL Final   Alkaline Phosphatase  Date Value Ref Range Status  05/25/2022 88 38 - 126 U/L Final   ALT  Date Value Ref Range Status  05/25/2022 23 0 - 44 U/L Final   AST  Date Value Ref Range Status  05/25/2022 28 15 - 41 U/L Final   BUN  Date Value Ref Range Status  05/25/2022 8 6 - 20 mg/dL  Final  01/23/2022 5 (L) 6 - 24 mg/dL Final   Calcium  Date Value Ref Range Status  05/25/2022 9.2 8.9 - 10.3 mg/dL Final   Calcium, Ion  Date Value Ref Range Status  05/07/2015 1.19 1.12 - 1.23 mmol/L Final   CO2  Date Value Ref Range Status  05/25/2022 22 22 - 32 mmol/L Final   TCO2  Date Value Ref Range Status  05/07/2015 19 0 - 100 mmol/L Final   Creatinine, Ser  Date Value Ref Range Status  05/25/2022 0.62 0.44 - 1.00 mg/dL Final   Glucose, Bld  Date Value Ref Range Status  05/25/2022 105 (H) 70 - 99 mg/dL Final    Comment:    Glucose reference range applies only to samples taken after fasting for at least 8 hours.   Glucose-Capillary  Date Value Ref Range Status  06/08/2021 138 (H) 70 - 99 mg/dL Final    Comment:    Glucose reference range applies only to samples taken after fasting for at least 8 hours.   Potassium  Date Value Ref Range Status  05/25/2022 3.6 3.5 - 5.1 mmol/L Final   Sodium  Date Value Ref Range Status  05/25/2022 133 (L) 135 - 145 mmol/L Final  01/23/2022 141 134 - 144 mmol/L Final   Total Bilirubin  Date Value Ref Range Status  05/25/2022 0.8 0.3 - 1.2 mg/dL Final   Bilirubin Total  Date Value Ref Range Status  01/23/2022 0.7 0.0 - 1.2 mg/dL Final   Bilirubin, Direct  Date Value Ref Range Status  05/25/2022 0.2 0.0 - 0.2 mg/dL Final   Indirect Bilirubin  Date Value Ref Range Status  05/25/2022 0.6 0.3 - 0.9 mg/dL Final    Comment:    Performed at [facility], [place] [address], [place], [place] [address]   Protein, ur  Date Value Ref Range Status  05/25/2022 NEGATIVE NEGATIVE mg/dL Final   Total Protein  Date Value Ref Range Status  05/25/2022 8.3 (H) 6.5 - 8.1 g/dL Final  01/23/2022 8.0 6.0 - 8.5 g/dL Final   GFR  calc Af Amer  Date Value Ref Range Status  05/03/2020 >60 >60 mL/min Final   eGFR  Date Value Ref Range Status  01/23/2022 104 >59 mL/min/1.73 Final   GFR, Estimated  Date Value Ref  Range Status  05/25/2022 >60 >60 mL/min Final    Comment:    (NOTE) Calculated using the CKD-EPI Creatinine Equation (2021)

## 2022-06-10 ENCOUNTER — Other Ambulatory Visit: Payer: Self-pay

## 2022-06-10 ENCOUNTER — Ambulatory Visit (INDEPENDENT_AMBULATORY_CARE_PROVIDER_SITE_OTHER): Payer: Self-pay | Admitting: *Deleted

## 2022-06-10 NOTE — Telephone Encounter (Signed)
Pharmacy confirmed rx refills left due to pt filling #30 at a time instead of #90. Pharm to fill and notify pt.  Reason for Disposition  Patient has refills remaining on their prescription  Answer Assessment - Initial Assessment Questions 1. DRUG NAME: "What medicine do you need to have refilled?" Respiratione  2. REFILLS REMAINING: "How many refills are remaining?" (Note: The label on the medicine or pill bottle will show how many refills are remaining. If there are no refills remaining, then a renewal may be needed.)     Has refills remaining, pharm called for clarification because she fills 30 at a time instead of 90 as it is ordered  Protocols used: Medication Refill and Renewal Call-A-AH

## 2022-06-11 ENCOUNTER — Other Ambulatory Visit: Payer: Self-pay

## 2022-07-11 ENCOUNTER — Other Ambulatory Visit: Payer: Self-pay

## 2022-07-25 ENCOUNTER — Ambulatory Visit (INDEPENDENT_AMBULATORY_CARE_PROVIDER_SITE_OTHER): Payer: Medicaid Other | Admitting: Primary Care

## 2022-08-19 ENCOUNTER — Other Ambulatory Visit: Payer: Self-pay

## 2022-08-20 ENCOUNTER — Other Ambulatory Visit: Payer: Self-pay

## 2022-09-18 ENCOUNTER — Other Ambulatory Visit: Payer: Self-pay

## 2022-09-26 ENCOUNTER — Ambulatory Visit (INDEPENDENT_AMBULATORY_CARE_PROVIDER_SITE_OTHER): Payer: Medicaid Other | Admitting: Primary Care

## 2022-09-26 ENCOUNTER — Encounter (INDEPENDENT_AMBULATORY_CARE_PROVIDER_SITE_OTHER): Payer: Self-pay | Admitting: Primary Care

## 2022-09-26 VITALS — BP 121/83 | HR 81 | Resp 16 | Ht 64.0 in | Wt 175.2 lb

## 2022-09-26 DIAGNOSIS — F17200 Nicotine dependence, unspecified, uncomplicated: Secondary | ICD-10-CM | POA: Diagnosis not present

## 2022-09-26 DIAGNOSIS — R350 Frequency of micturition: Secondary | ICD-10-CM

## 2022-09-26 DIAGNOSIS — D1722 Benign lipomatous neoplasm of skin and subcutaneous tissue of left arm: Secondary | ICD-10-CM

## 2022-09-26 DIAGNOSIS — Z1211 Encounter for screening for malignant neoplasm of colon: Secondary | ICD-10-CM | POA: Diagnosis not present

## 2022-09-26 DIAGNOSIS — M2550 Pain in unspecified joint: Secondary | ICD-10-CM | POA: Diagnosis not present

## 2022-09-26 DIAGNOSIS — Z0000 Encounter for general adult medical examination without abnormal findings: Secondary | ICD-10-CM

## 2022-09-26 LAB — POCT URINALYSIS DIP (CLINITEK)
Bilirubin, UA: NEGATIVE
Glucose, UA: NEGATIVE mg/dL
Ketones, POC UA: NEGATIVE mg/dL
Leukocytes, UA: NEGATIVE
Nitrite, UA: NEGATIVE
POC PROTEIN,UA: NEGATIVE
Spec Grav, UA: <=1.005 — AB (ref 1.010–1.025)
Urobilinogen, UA: 0.2 E.U./dL
pH, UA: 5.5 (ref 5.0–8.0)

## 2022-09-26 MED ORDER — IBUPROFEN 800 MG PO TABS: 800.0000 mg | ORAL_TABLET | Freq: Three times a day (TID) | ORAL | 0 refills | Status: DC | PRN

## 2022-09-26 NOTE — Progress Notes (Signed)
Patient presents to clinic today for annual exam.  Patient is fasting for labs.  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   left eye injury 09-16-2014  s/p ruptured globe repair;   09-23-2014 s/p enucleation w/ implant   Fatty liver    with elevated LFTs , 06-06-2021   History of gastritis 05/2021   per EGD   History of seizure due to alcohol withdrawal    2020  none since per pt   History of trichomoniasis 2010   Hyperlipidemia, mixed    Seizures ([place])    last time 4 years ago per pt   Thickened endometrium    Wears glasses      Health Maintenance: Immunizations -- not up to date patient decline Colon Cancer Screening -- ordered Mammogram -- 01/18/22 PAP -- 09/14/20  HIV 01/02/17 /Hep C Screening 09/17/14  Past Medical History:  Diagnosis Date   Alcohol withdrawal ([place]) 06/06/2021   with hospital admission   Alcoholism ([place])    dependance w/ withdrawal syndrome including seizures   Anemia    Bipolar 1 disorder ([place])    Cocaine abuse ([place])    no cocaine used in 6 years per pt on 10-05-2021   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   left eye injury 09-16-2014  s/p ruptured globe repair;   09-23-2014 s/p enucleation w/ implant   Fatty liver    with elevated LFTs , 06-06-2021   History of gastritis 05/2021   per EGD   History of seizure due to alcohol withdrawal    2020  none since per pt   History of trichomoniasis 2010   Hyperlipidemia, mixed    Seizures ([place])    last time 4 years ago per pt   Thickened endometrium    Wears glasses     Past Surgical History:  Procedure Laterality Date   CERVICAL BIOPSY  2022   CESAREAN  SECTION  1990   DILATATION & CURETTAGE/HYSTEROSCOPY WITH MYOSURE  10/10/2021   Procedure: DILATATION & CURETTAGE/HYSTEROSCOPY WITH MYOSURE,polyectomy;  Surgeon: Aletha Halim, MD;  Location: [facility];  Service: Gynecology;;   ENUCLEATION Left 09/23/2014   '@[facility]'$ ;  w/ implant   ESOPHAGOGASTRODUODENOSCOPY N/A 06/07/2021   Procedure: ESOPHAGOGASTRODUODENOSCOPY (EGD);  Surgeon: Carol Ada, MD;  Location: [facility];  Service: Endoscopy;  Laterality: N/A;  Epigastric pain with nausea vomiting and 40 pound weight loss   EYE SURGERY Right 06/2015   cyst removal from medial canthus   HYSTEROSCOPY WITH D & C N/A 10/10/2021   Procedure: DILATATION AND CURETTAGE /HYSTEROSCOPY;  Surgeon: Aletha Halim, MD;  Location: [facility];  Service: Gynecology;  Laterality: N/A;   RUPTURED GLOBE EXPLORATION AND REPAIR Left 09/16/2014   Procedure:  Ruptured Globe Repair Left Eye;  Surgeon: Lamonte Sakai, MD;  Location: [facility];  Service: Ophthalmology;  Laterality: Left;   TUBAL LIGATION Bilateral 1994    Current Outpatient Medications on File Prior to Visit  Medication Sig Dispense Refill   atorvastatin (LIPITOR) 40 MG tablet Take 1 tablet (40 mg total) by mouth daily. (Patient taking differently: Take  40 mg by mouth at bedtime.) 90 tablet 3   docusate sodium (COLACE) 100 MG capsule Take 1 capsule (100 mg total) by mouth 2 (two) times daily as needed for mild constipation. (Patient not taking: Reported on 05/25/2022) 30 capsule 0   naproxen (NAPROSYN) 375 MG tablet Take 1 tablet (375 mg total) by mouth 2 (two) times daily. 20 tablet 0   ondansetron (ZOFRAN-ODT) 4 MG disintegrating tablet Take 1 tablet (4 mg total) by mouth every 8 (eight) hours as needed for nausea or vomiting. 20 tablet 0   oxyCODONE-acetaminophen (PERCOCET/ROXICET) 5-325 MG tablet Take 1 tablet by mouth every 6 (six) hours as needed for severe pain. 6 tablet 0   risperiDONE (RISPERDAL) 1 MG tablet TAKE 1  TABLET (1 MG TOTAL) BY MOUTH ONCE NIGHTLY AT BEDTIME. (Patient taking differently: Take 1 mg by mouth at bedtime.) 90 tablet 1   No current facility-administered medications on file prior to visit.    No Known Allergies  Family History  Problem Relation Age of Onset   Lupus Mother    Heart disease Mother    Colon cancer Father        late 72's early 32's   Thyroid disease Daughter    Bipolar disorder Daughter    Breast cancer Maternal Aunt    Breast cancer Maternal Grandmother    Hypertension Other    Cancer Other    CAD Other     Social History   Socioeconomic History   Marital status: Single    Spouse name: Not on file   Number of children: 6   Years of education: Not on file   Highest education level: Not on file  Occupational History   Occupation: Nurses Aide in past.    Comment: unemployed since eye injury about 1 year ago.  Tobacco Use   Smoking status: Every Day    Packs/day: 0.50    Years: 43.00    Total pack years: 21.50    Types: Cigarettes    Start date: 02/24/1976   Smokeless tobacco: Never  Vaping Use   Vaping Use: Never used  Substance and Sexual Activity   Alcohol use: Yes    Alcohol/week: 12.0 standard drinks of alcohol    Types: 12 Cans of beer per week    Comment: alcoholism   Drug use: Yes    Frequency: 7.0 times per week    Types: Marijuana, Cocaine    Comment: hx of cocaine use last cocaine use 6 yrs ago and smokes marijuna daily per pt on 10-05-2021   Sexual activity: Not Currently    Birth control/protection: Surgical  Other Topics Concern   Not on file  Social History Narrative   Originally from [place], but moved to [place] at age 24 yo.     Lived with mother in [place].   Younger sister killed there--the man who was thought to be the killer was murdered in front of the patient's home by some of her sister's friends.   Older sister is felt to have been kidnapped by her [place] husband and take to [place]--occurred 5 years ago.    Middle sister infected with HIV from her husband.    Pt. Lives with a female platonic friend.    Social Determinants of Health   Financial Resource Strain: Not on file  Food Insecurity: Food Insecurity Present (08/08/2021)   Hunger Vital Sign    Worried About Running Out of Food in the Last Year: Never true  Ran Out of Food in the Last Year: Sometimes true  Transportation Needs: Unmet Transportation Needs (08/08/2021)   PRAPARE - Hydrologist (Medical): Yes    Lack of Transportation (Non-Medical): Yes  Physical Activity: Not on file  Stress: Not on file  Social Connections: Not on file  Intimate Partner Violence: Not on file    ROS Comprehensive ROS Pertinent positive and negative noted in HPI   Blood Pressure 121/83   Pulse 81   Respiration 16   Height '5\' 4"'$  (1.626 m)   Weight 175 lb 3.2 oz (79.5 kg)   Last Menstrual Period 03/15/2011   Oxygen Saturation 95%   Body Mass Index 30.07 kg/m   Physical exam: General: Vital signs reviewed.  Patient is well-developed and well-nourished, obese female in no acute distress and cooperative with exam. Head: Normocephalic and atraumatic. Eyes: EOMI, conjunctivae normal, no scleral icterus. Neck: Supple, trachea midline, normal ROM, no JVD, masses, thyromegaly, or carotid bruit present. Cardiovascular: RRR, S1 normal, S2 normal, no murmurs, gallops, or rubs. Pulmonary/Chest: Clear to auscultation bilaterally, no wheezes, rales, or rhonchi. Abdominal: Soft, non-tender, non-distended, BS +, no masses, organomegaly, or guarding present. Musculoskeletal: No joint deformities, erythema, or stiffness, ROM full and nontender. Extremities: No lower extremity edema bilaterally,  pulses symmetric and intact bilaterally. No cyanosis or clubbing. Neurological: A&O x3, Strength is normal Skin: Warm, dry and intact. No rashes or erythema. Psychiatric: Normal mood and affect. speech and behavior is normal. Cognition and  memory are normal.  Assessment/Plan: Janet Ramos was seen today for annual exam and joint pain.  Diagnoses and all orders for this visit:  Colon cancer screening -     Ambulatory referral to Gastroenterology  Tobacco dependence -     CT CHEST LUNG CA SCREEN LOW DOSE W/O CM; Future  Annual physical exam  Arthralgia, unspecified joint -     Sedimentation Rate -     Rheumatoid Arthritis Profile  Frequent urination -     POCT URINALYSIS DIP (CLINITEK)  Lipoma of left shoulder No pain no swelling no redness lump under skin (rubbery texture)   Other orders -     ibuprofen (ADVIL) 800 MG tablet; Take 1 tablet (800 mg total) by mouth every 8 (eight) hours as needed.      This visit occurred during the SARS-CoV-2 public health emergency.  Safety protocols were in place, including screening questions prior to the visit, additional usage of staff PPE, and extensive cleaning of exam room while observing appropriate contact time as indicated for disinfecting solutions.    Kerin Perna, NP

## 2022-09-26 NOTE — Patient Instructions (Signed)
Lipoma  A lipoma is a noncancerous (benign) tumor that is made up of fat cells. This is a very common type of soft-tissue growth. Lipomas are usually found under the skin (subcutaneous). They may occur in any tissue of the body that contains fat. Common areas for lipomas to appear include the back, arms, shoulders, buttocks, and thighs. Lipomas grow slowly, and they are usually painless. Most lipomas do not cause problems and do not require treatment. What are the causes? The cause of this condition is not known. What increases the risk? You are more likely to develop this condition if: You are 40-60 years old. You have a family history of lipomas. What are the signs or symptoms? A lipoma usually appears as a small, round bump under the skin. In most cases, the lump will: Feel soft or rubbery. Not cause pain or other symptoms. However, if a lipoma is located in an area where it pushes on nerves, it can become painful or cause other symptoms. How is this diagnosed? A lipoma can usually be diagnosed with a physical exam. You may also have tests to confirm the diagnosis and to rule out other conditions. Tests may include: Imaging tests, such as a CT scan or an MRI. Removal of a tissue sample to be looked at under a microscope (biopsy). How is this treated? Treatment for this condition depends on the size of the lipoma and whether it is causing any symptoms. For small lipomas that are not causing problems, no treatment is needed. If a lipoma is bigger or it causes problems, surgery may be done to remove the lipoma. Lipomas can also be removed to improve appearance. Most often, the procedure is done after applying a medicine that numbs the area (local anesthetic). Liposuction may be done to reduce the size of the lipoma before it is removed through surgery, or it may be done to remove the lipoma. Lipomas are removed with this method to limit incision size and scarring. A liposuction tube is  inserted through a small incision into the lipoma, and the contents of the lipoma are removed through the tube with suction. Follow these instructions at home: Watch your lipoma for any changes. Keep all follow-up visits. This is important. Where to find more information OrthoInfo: orthoinfo.aaos.org Contact a health care provider if: Your lipoma becomes larger or hard. Your lipoma becomes painful, red, or increasingly swollen. These could be signs of infection or a more serious condition. Get help right away if: You develop tingling or numbness in an area near the lipoma. This could indicate that the lipoma is causing nerve damage. Summary A lipoma is a noncancerous tumor that is made up of fat cells. Most lipomas do not cause problems and do not require treatment. If a lipoma is bigger or it causes problems, surgery may be done to remove the lipoma. Contact a health care provider if your lipoma becomes larger or hard, or if it becomes painful, red, or increasingly swollen. These could be signs of infection or a more serious condition. This information is not intended to replace advice given to you by your health care provider. Make sure you discuss any questions you have with your health care provider. Document Revised: 10/19/2021 Document Reviewed: 10/19/2021 Elsevier Patient Education  2023 Elsevier Inc.  

## 2022-10-01 LAB — SEDIMENTATION RATE: Sed Rate: 48 mm/hr — ABNORMAL HIGH (ref 0–40)

## 2022-10-01 LAB — RHEUMATOID ARTHRITIS PROFILE
Cyclic Citrullin Peptide Ab: 8 units (ref 0–19)
Rheumatoid fact SerPl-aCnc: <10 IU/mL (ref <14.0)

## 2022-10-21 ENCOUNTER — Ambulatory Visit (INDEPENDENT_AMBULATORY_CARE_PROVIDER_SITE_OTHER): Payer: Self-pay

## 2022-10-21 NOTE — Telephone Encounter (Signed)
Message from Erick Blinks sent at 10/21/2022  1:46 PM EST  Summary: Lab results   Pt wants to discuss lab results  Best contact: [phone]        Called pt and advised that no result notes for last lab work date 09/26/22. Advised pt will forward to office for review.  Pt stated that she wants to leave [facility]. Pt stated that she does not feel she is getting the care she needs and was upset about not knowing results.  Scheduled pt to [facility]. Per her request. Appt is >14 days out. Routing back to office.  Answer Assessment - Initial Assessment Questions 1. REASON FOR CALL or QUESTION: "What is your reason for calling today?" or "How can I best help you?" or "What question do you have that I can help answer?"     Lab results  Protocols used: Information Only Call - No Triage-A-AH

## 2022-10-22 NOTE — Telephone Encounter (Signed)
LVM to call office.

## 2022-10-24 ENCOUNTER — Other Ambulatory Visit: Payer: Self-pay

## 2022-10-25 ENCOUNTER — Other Ambulatory Visit: Payer: Self-pay

## 2022-11-18 ENCOUNTER — Other Ambulatory Visit: Payer: Self-pay

## 2022-11-18 ENCOUNTER — Other Ambulatory Visit (INDEPENDENT_AMBULATORY_CARE_PROVIDER_SITE_OTHER): Payer: Self-pay | Admitting: Primary Care

## 2022-11-18 DIAGNOSIS — E782 Mixed hyperlipidemia: Secondary | ICD-10-CM

## 2022-11-18 DIAGNOSIS — Z760 Encounter for issue of repeat prescription: Secondary | ICD-10-CM

## 2022-11-18 MED ORDER — ATORVASTATIN CALCIUM 40 MG PO TABS
40.0000 mg | ORAL_TABLET | Freq: Every day | ORAL | 3 refills | Status: DC
  Filled 2022-11-18: qty 90, 90d supply, fill #0
  Filled 2023-02-12: qty 90, 90d supply, fill #1
  Filled 2023-05-27 – 2023-06-13 (×3): qty 90, 90d supply, fill #2

## 2022-11-18 MED ORDER — RISPERIDONE 1 MG PO TABS
1.0000 mg | ORAL_TABLET | Freq: Every day | ORAL | 1 refills | Status: DC
  Filled 2022-11-18: qty 90, 90d supply, fill #0
  Filled 2023-02-12 (×2): qty 90, 90d supply, fill #1

## 2022-11-18 NOTE — Telephone Encounter (Signed)
Pt called in upset and wanting to leave the practice d/t no one FU with her regarding labs and she called in on 10/21/22. Advised pt that Sharyn Lull, NP did try to call her back on 10/22/22 and LVMTCB. Reviewed labs with pt. She states she needed medication refilled and has been sent from pharmacy. Advised pt I would have to send to provider for review d/t non delegated and unable to fill d/t failed labs. Pt verbalized understanding and states she is going out of town tomorrow and does not have enough meds to last her d/t she was told they would be sent in during [place]. Advised pt I would send to provider for review and to FU with pharmacy this evening to see if medications had been sent in or not. Pt verbalized understanding.

## 2022-11-18 NOTE — Telephone Encounter (Signed)
Will forward to provider  

## 2022-11-18 NOTE — Telephone Encounter (Signed)
Requested medication (s) are due for refill today: yes  Requested medication (s) are on the active medication list: yes  Last refill:  Risperidone 03/2022, Atorvastatin 09/19/21  Future visit scheduled: yes  Notes to clinic:  risperidone is not delegated, needing updated labs for atorvastatin. Pt going out of town for several days tomorrow and doesn't have enough medications left.     Requested Prescriptions  Pending Prescriptions Disp Refills   atorvastatin (LIPITOR) 40 MG tablet 90 tablet 3    Sig: Take 1 tablet (40 mg total) by mouth daily.     Cardiovascular:  Antilipid - Statins Failed - 11/18/2022 12:59 PM      Failed - Lipid Panel in normal range within the last 12 months    Cholesterol, Total  Date Value Ref Range Status  01/23/2022 195 100 - 199 mg/dL Final   LDL Chol Calc (NIH)  Date Value Ref Range Status  01/23/2022 80 0 - 99 mg/dL Final   HDL  Date Value Ref Range Status  01/23/2022 102 >39 mg/dL Final   Triglycerides  Date Value Ref Range Status  01/23/2022 74 0 - 149 mg/dL Final         Passed - Patient is not pregnant      Passed - Valid encounter within last 12 months    Recent Outpatient Visits           1 month ago Colon cancer screening   [place], Michelle P, NP   9 months ago Hypokalemia   [place], Michelle P, NP   1 year ago Need for immunization against influenza   [place], Michelle P, NP   1 year ago Abnormal liver enzymes   [place], Michelle P, NP   1 year ago Thick sputum   [place], [place], NP       Future Appointments             In 2 months Gildardo Pounds, NP [place]             risperiDONE (RISPERDAL) 1 MG tablet 90 tablet 1    Sig: TAKE 1 TABLET (1 MG TOTAL) BY MOUTH ONCE NIGHTLY AT  BEDTIME.     Not Delegated - Psychiatry:  Antipsychotics - Second Generation (Atypical) - risperidone Failed - 11/18/2022 12:59 PM      Failed - This refill cannot be delegated      Failed - TSH in normal range and within 360 days    TSH  Date Value Ref Range Status  06/06/2021 3.002 0.350 - 4.500 uIU/mL Final    Comment:    Performed by a 3rd Generation assay with a functional sensitivity of <=0.01 uIU/mL. Performed at [facility], [place] [address], [place], [place] [address]   03/19/2021 1.180 0.450 - 4.500 uIU/mL Final         Failed - Lipid Panel in normal range within the last 12 months    Cholesterol, Total  Date Value Ref Range Status  01/23/2022 195 100 - 199 mg/dL Final   LDL Chol Calc (NIH)  Date Value Ref Range Status  01/23/2022 80 0 - 99 mg/dL Final   HDL  Date Value Ref Range Status  01/23/2022 102 >39 mg/dL Final   Triglycerides  Date Value Ref Range Status  01/23/2022 74 0 -  149 mg/dL Final         Passed - Completed PHQ-2 or PHQ-9 in the last 360 days      Passed - Last BP in normal range    BP Readings from Last 1 Encounters:  09/26/22 121/83         Passed - Last Heart Rate in normal range    Pulse Readings from Last 1 Encounters:  09/26/22 81         Passed - Valid encounter within last 6 months    Recent Outpatient Visits           1 month ago Colon cancer screening   [place], Michelle P, NP   9 months ago Hypokalemia   [place], Michelle P, NP   1 year ago Need for immunization against influenza   [place], Michelle P, NP   1 year ago Abnormal liver enzymes   [facility] Kerin Perna, NP   1 year ago Thick sputum   [place], Michelle P, NP       Future Appointments             In 2 months Gildardo Pounds, NP [place] within normal limits and completed in the last 12 months    WBC  Date Value Ref Range Status  05/25/2022 9.2 4.0 - 10.5 K/uL Final   RBC  Date Value Ref Range Status  05/25/2022 4.18 3.87 - 5.11 MIL/uL Final   Hemoglobin  Date Value Ref Range Status  05/25/2022 13.8 12.0 - 15.0 g/dL Final  01/23/2022 15.2 11.1 - 15.9 g/dL Final   HCT  Date Value Ref Range Status  05/25/2022 37.4 36.0 - 46.0 % Final   Hematocrit  Date Value Ref Range Status  01/23/2022 43.3 34.0 - 46.6 % Final   MCHC  Date Value Ref Range Status  05/25/2022 36.9 (H) 30.0 - 36.0 g/dL Final   [facility]  Date Value Ref Range Status  05/25/2022 33.0 26.0 - 34.0 pg Final   MCV  Date Value Ref Range Status  05/25/2022 89.5 80.0 - 100.0 fL Final  01/23/2022 95 79 - 97 fL Final   No results found for: "PLTCOUNTKUC", "LABPLAT", "POCPLA" RDW  Date Value Ref Range Status  05/25/2022 13.6 11.5 - 15.5 % Final  01/23/2022 12.5 11.7 - 15.4 % Final         Passed - CMP within normal limits and completed in the last 12 months    Albumin  Date Value Ref Range Status  05/25/2022 3.9 3.5 - 5.0 g/dL Final  01/23/2022 4.5 3.8 - 4.9 g/dL Final   Alkaline Phosphatase  Date Value Ref Range Status  05/25/2022 88 38 - 126 U/L Final   ALT  Date Value Ref Range Status  05/25/2022 23 0 - 44 U/L Final   AST  Date Value Ref Range Status  05/25/2022 28 15 - 41 U/L Final   BUN  Date Value Ref Range Status  05/25/2022 8 6 - 20 mg/dL Final  01/23/2022 5 (L) 6 - 24 mg/dL Final   Calcium  Date Value Ref Range Status  05/25/2022 9.2 8.9 - 10.3 mg/dL Final   Calcium, Ion  Date Value Ref Range Status  05/07/2015 1.19 1.12 -  1.23 mmol/L Final   CO2  Date Value Ref Range Status  05/25/2022 22 22 - 32 mmol/L Final   TCO2  Date Value Ref Range Status  05/07/2015 19 0 - 100 mmol/L Final   Creatinine, Ser  Date Value Ref Range Status  05/25/2022 0.62 0.44  - 1.00 mg/dL Final   Glucose, Bld  Date Value Ref Range Status  05/25/2022 105 (H) 70 - 99 mg/dL Final    Comment:    Glucose reference range applies only to samples taken after fasting for at least 8 hours.   Glucose-Capillary  Date Value Ref Range Status  06/08/2021 138 (H) 70 - 99 mg/dL Final    Comment:    Glucose reference range applies only to samples taken after fasting for at least 8 hours.   Potassium  Date Value Ref Range Status  05/25/2022 3.6 3.5 - 5.1 mmol/L Final   Sodium  Date Value Ref Range Status  05/25/2022 133 (L) 135 - 145 mmol/L Final  01/23/2022 141 134 - 144 mmol/L Final   Total Bilirubin  Date Value Ref Range Status  05/25/2022 0.8 0.3 - 1.2 mg/dL Final   Bilirubin Total  Date Value Ref Range Status  01/23/2022 0.7 0.0 - 1.2 mg/dL Final   Bilirubin, Direct  Date Value Ref Range Status  05/25/2022 0.2 0.0 - 0.2 mg/dL Final   Indirect Bilirubin  Date Value Ref Range Status  05/25/2022 0.6 0.3 - 0.9 mg/dL Final    Comment:    Performed at [facility], [place] [address], [place], [place] [address]   Protein, ur  Date Value Ref Range Status  05/25/2022 NEGATIVE NEGATIVE mg/dL Final   Total Protein  Date Value Ref Range Status  05/25/2022 8.3 (H) 6.5 - 8.1 g/dL Final  01/23/2022 8.0 6.0 - 8.5 g/dL Final   GFR calc Af Amer  Date Value Ref Range Status  05/03/2020 >60 >60 mL/min Final   eGFR  Date Value Ref Range Status  01/23/2022 104 >59 mL/min/1.73 Final   GFR, Estimated  Date Value Ref Range Status  05/25/2022 >60 >60 mL/min Final    Comment:    (NOTE) Calculated using the CKD-EPI Creatinine Equation (2021)

## 2022-11-19 ENCOUNTER — Other Ambulatory Visit: Payer: Self-pay

## 2023-01-20 ENCOUNTER — Other Ambulatory Visit (INDEPENDENT_AMBULATORY_CARE_PROVIDER_SITE_OTHER): Payer: Self-pay | Admitting: Primary Care

## 2023-01-20 ENCOUNTER — Other Ambulatory Visit: Payer: Self-pay

## 2023-01-20 ENCOUNTER — Ambulatory Visit: Payer: Medicaid Other | Admitting: Nurse Practitioner

## 2023-01-20 NOTE — Telephone Encounter (Signed)
Medication Refill - Medication: ibuprofen (ADVIL) 800 MG tablet   Has the patient contacted their pharmacy? No.   Preferred Pharmacy (with phone number or street name):  [facility] [phone] - , [place] - [address] Phone: [phone]  Fax: [phone]     Has the patient been seen for an appointment in the last year OR does the patient have an upcoming appointment? Yes.

## 2023-01-21 ENCOUNTER — Ambulatory Visit (INDEPENDENT_AMBULATORY_CARE_PROVIDER_SITE_OTHER): Payer: Medicaid Other | Admitting: Primary Care

## 2023-01-21 MED ORDER — IBUPROFEN 800 MG PO TABS: 800.0000 mg | ORAL_TABLET | Freq: Three times a day (TID) | ORAL | 0 refills | Status: DC | PRN

## 2023-01-21 NOTE — Telephone Encounter (Signed)
Requested Prescriptions  Pending Prescriptions Disp Refills   ibuprofen (ADVIL) 800 MG tablet 90 tablet 0    Sig: Take 1 tablet (800 mg total) by mouth every 8 (eight) hours as needed.     Analgesics:  NSAIDS Failed - 01/20/2023  1:41 PM      Failed - Manual Review: Labs are only required if the patient has taken medication for more than 8 weeks.      Passed - Cr in normal range and within 360 days    Creatinine, Ser  Date Value Ref Range Status  05/25/2022 0.62 0.44 - 1.00 mg/dL Final         Passed - HGB in normal range and within 360 days    Hemoglobin  Date Value Ref Range Status  05/25/2022 13.8 12.0 - 15.0 g/dL Final  45/99/7741 42.3 11.1 - 15.9 g/dL Final         Passed - PLT in normal range and within 360 days    Platelets  Date Value Ref Range Status  05/25/2022  150 - 400 K/uL Final   PLATELET CLUMPS NOTED ON SMEAR, COUNT APPEARS DECREASED  01/23/2022 153 150 - 450 x10E3/uL Final         Passed - HCT in normal range and within 360 days    HCT  Date Value Ref Range Status  05/25/2022 37.4 36.0 - 46.0 % Final   Hematocrit  Date Value Ref Range Status  01/23/2022 43.3 34.0 - 46.6 % Final         Passed - eGFR is 30 or above and within 360 days    GFR calc Af Amer  Date Value Ref Range Status  05/03/2020 >60 >60 mL/min Final   GFR, Estimated  Date Value Ref Range Status  05/25/2022 >60 >60 mL/min Final    Comment:    (NOTE) Calculated using the CKD-EPI Creatinine Equation (2021)    eGFR  Date Value Ref Range Status  01/23/2022 104 >59 mL/min/1.73 Final         Passed - Patient is not pregnant      Passed - Valid encounter within last 12 months    Recent Outpatient Visits           3 months ago Colon cancer screening   [place] Grayce Sessions, NP   12 months ago Hypokalemia   [place] [facility] Grayce Sessions, NP   1 year ago Need for immunization against influenza   [place]  [facility] Grayce Sessions, NP   1 year ago Abnormal liver enzymes   [place] [facility] Grayce Sessions, NP   1 year ago Thick sputum   [place] [facility] Grayce Sessions, NP       Future Appointments             In 3 weeks Hoy Register, MD [facility]

## 2023-02-12 ENCOUNTER — Other Ambulatory Visit: Payer: Self-pay

## 2023-02-12 ENCOUNTER — Ambulatory Visit: Payer: Medicaid Other | Attending: Family Medicine | Admitting: Family Medicine

## 2023-02-12 ENCOUNTER — Encounter: Payer: Self-pay | Admitting: Family Medicine

## 2023-02-12 VITALS — BP 127/81 | HR 78 | Temp 98.2°F | Ht 64.0 in | Wt 173.8 lb

## 2023-02-12 DIAGNOSIS — Z13228 Encounter for screening for other metabolic disorders: Secondary | ICD-10-CM

## 2023-02-12 DIAGNOSIS — D1779 Benign lipomatous neoplasm of other sites: Secondary | ICD-10-CM

## 2023-02-12 DIAGNOSIS — F1994 Other psychoactive substance use, unspecified with psychoactive substance-induced mood disorder: Secondary | ICD-10-CM | POA: Diagnosis not present

## 2023-02-12 DIAGNOSIS — Z1211 Encounter for screening for malignant neoplasm of colon: Secondary | ICD-10-CM

## 2023-02-12 NOTE — Patient Instructions (Signed)
Lipoma  A lipoma is a noncancerous (benign) tumor that is made up of fat cells. This is a very common type of soft-tissue growth. Lipomas are usually found under the skin (subcutaneous). They may occur in any tissue of the body that contains fat. Common areas for lipomas to appear include the back, arms, shoulders, buttocks, and thighs. Lipomas grow slowly, and they are usually painless. Most lipomas do not cause problems and do not require treatment. What are the causes? The cause of this condition is not known. What increases the risk? You are more likely to develop this condition if: You are 40-60 years old. You have a family history of lipomas. What are the signs or symptoms? A lipoma usually appears as a small, round bump under the skin. In most cases, the lump will: Feel soft or rubbery. Not cause pain or other symptoms. However, if a lipoma is located in an area where it pushes on nerves, it can become painful or cause other symptoms. How is this diagnosed? A lipoma can usually be diagnosed with a physical exam. You may also have tests to confirm the diagnosis and to rule out other conditions. Tests may include: Imaging tests, such as a CT scan or an MRI. Removal of a tissue sample to be looked at under a microscope (biopsy). How is this treated? Treatment for this condition depends on the size of the lipoma and whether it is causing any symptoms. For small lipomas that are not causing problems, no treatment is needed. If a lipoma is bigger or it causes problems, surgery may be done to remove the lipoma. Lipomas can also be removed to improve appearance. Most often, the procedure is done after applying a medicine that numbs the area (local anesthetic). Liposuction may be done to reduce the size of the lipoma before it is removed through surgery, or it may be done to remove the lipoma. Lipomas are removed with this method to limit incision size and scarring. A liposuction tube is  inserted through a small incision into the lipoma, and the contents of the lipoma are removed through the tube with suction. Follow these instructions at home: Watch your lipoma for any changes. Keep all follow-up visits. This is important. Where to find more information OrthoInfo: orthoinfo.aaos.org Contact a health care provider if: Your lipoma becomes larger or hard. Your lipoma becomes painful, red, or increasingly swollen. These could be signs of infection or a more serious condition. Get help right away if: You develop tingling or numbness in an area near the lipoma. This could indicate that the lipoma is causing nerve damage. Summary A lipoma is a noncancerous tumor that is made up of fat cells. Most lipomas do not cause problems and do not require treatment. If a lipoma is bigger or it causes problems, surgery may be done to remove the lipoma. Contact a health care provider if your lipoma becomes larger or hard, or if it becomes painful, red, or increasingly swollen. These could be signs of infection or a more serious condition. This information is not intended to replace advice given to you by your health care provider. Make sure you discuss any questions you have with your health care provider. Document Revised: 10/19/2021 Document Reviewed: 10/19/2021 Elsevier Patient Education  2023 Elsevier Inc.  

## 2023-02-12 NOTE — Progress Notes (Signed)
Knot on left shoulder Check kidney and liver function.

## 2023-02-12 NOTE — Progress Notes (Signed)
Subjective:  Patient ID: Janet Ramos, female    DOB: 1961-01-25  Age: 62 y.o. MRN: [id number]  CC: Establish Care   HPI [name] is a 62 y.o. year old female with a history of Sciatica who presents today to establish care.  Previously followed by Gwinda Passe, NP.  Interval History:  She Complains of a knot on her left shoulder which has increased in size, is sometimes painful.  Lesion has been present for several years and she would like this removed.  She has been on Risperidol "for anxiety" she states. She Complains of  problems with anger and goes off on people. States she has been on it for years.  Her chart reveals a history of substance induced mood disorder. She is not under the care of a psychiatrist.  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   left eye injury 09-16-2014  s/p ruptured globe repair;   09-23-2014 s/p enucleation w/ implant   Fatty liver    with elevated LFTs , 06-06-2021   History of gastritis 05/2021   per EGD   History of seizure due to alcohol withdrawal    2020  none since per pt   History of trichomoniasis 2010   Hyperlipidemia, mixed    Seizures (HCC)    last time 4 years ago per pt   Thickened endometrium    Wears glasses     Past Surgical History:  Procedure Laterality Date   CERVICAL BIOPSY  2022   CESAREAN SECTION  1990   DILATATION & CURETTAGE/HYSTEROSCOPY WITH MYOSURE  10/10/2021   Procedure: DILATATION & CURETTAGE/HYSTEROSCOPY WITH MYOSURE,polyectomy;  Surgeon: Bann[place] Bing, MD;  Location: [facility];  Service: Gynecology;;   ENUCLEATION Left 09/23/2014   @[facility] ;  w/ implant   ESOPHAGOGASTRODUODENOSCOPY N/A  06/07/2021   Procedure: ESOPHAGOGASTRODUODENOSCOPY (EGD);  Surgeon: Jeani Hawking, MD;  Location: [facility];  Service: Endoscopy;  Laterality: N/A;  Epigastric pain with nausea vomiting and 40 pound weight loss   EYE SURGERY Right 06/2015   cyst removal from medial canthus   HYSTEROSCOPY WITH D & C N/A 10/10/2021   Procedure: DILATATION AND CURETTAGE /HYSTEROSCOPY;  Surgeon: [place] Bing, MD;  Location: [facility];  Service: Gynecology;  Laterality: N/A;   RUPTURED GLOBE EXPLORATION AND REPAIR Left 09/16/2014   Procedure:  Ruptured Globe Repair Left Eye;  Surgeon: French Ana, MD;  Location: [facility];  Service: Ophthalmology;  Laterality: Left;   TUBAL LIGATION Bilateral 1994    Family History  Problem Relation Age of Onset   Lupus Mother    Heart disease Mother    Colon cancer Father        late 31's early 74's   Thyroid disease Daughter    Bipolar disorder Daughter    Breast cancer Maternal Aunt    Breast cancer Maternal Grandmother    Hypertension Other    Cancer Other    CAD Other     Social History   Socioeconomic History   Marital status: Single    Spouse name: Not on file   Number of children: 6   Years of education: Not on file  Highest education level: Not on file  Occupational History   Occupation: Nurses Aide in past.    Comment: unemployed since eye injury about 1 year ago.  Tobacco Use   Smoking status: Every Day    Packs/day: 0.50    Years: 43.00    Additional pack years: 0.00    Total pack years: 21.50    Types: Cigarettes    Start date: 02/24/1976   Smokeless tobacco: Never  Vaping Use   Vaping Use: Never used  Substance and Sexual Activity   Alcohol use: Yes    Alcohol/week: 12.0 standard drinks of alcohol    Types: 12 Cans of beer per week    Comment: alcoholism   Drug use: Yes    Frequency: 7.0 times per week    Types: Marijuana, Cocaine    Comment: hx of cocaine use last cocaine use 6 yrs ago and smokes marijuna daily  per pt on 10-05-2021   Sexual activity: Not Currently    Birth control/protection: Surgical  Other Topics Concern   Not on file  Social History Narrative   Originally from [place], but moved to [place] at age 38 yo.     Lived with mother in [place].   Younger sister killed there--the man who was thought to be the killer was murdered in front of the patient's home by some of her sister's friends.   Older sister is felt to have been kidnapped by her [place] husband and take to [place]--occurred 5 years ago.   Middle sister infected with HIV from her husband.    Pt. Lives with a female platonic friend.    Social Determinants of Health   Financial Resource Strain: Not on file  Food Insecurity: Food Insecurity Present (08/08/2021)   Hunger Vital Sign    Worried About Running Out of Food in the Last Year: Never true    Ran Out of Food in the Last Year: Sometimes true  Transportation Needs: Unmet Transportation Needs (08/08/2021)   PRAPARE - Administrator, Civil Service (Medical): Yes    Lack of Transportation (Non-Medical): Yes  Physical Activity: Not on file  Stress: Not on file  Social Connections: Not on file    No Known Allergies  Outpatient Medications Prior to Visit  Medication Sig Dispense Refill   atorvastatin (LIPITOR) 40 MG tablet Take 1 tablet (40 mg total) by mouth daily. 90 tablet 3   ibuprofen (ADVIL) 800 MG tablet Take 1 tablet (800 mg total) by mouth every 8 (eight) hours as needed. 90 tablet 0   risperiDONE (RISPERDAL) 1 MG tablet Take 1 tablet (1 mg total) by mouth at bedtime. 90 tablet 1   docusate sodium (COLACE) 100 MG capsule Take 1 capsule (100 mg total) by mouth 2 (two) times daily as needed for mild constipation. (Patient not taking: Reported on 05/25/2022) 30 capsule 0   naproxen (NAPROSYN) 375 MG tablet Take 1 tablet (375 mg total) by mouth 2 (two) times daily. (Patient not taking: Reported on 02/12/2023) 20 tablet 0   ondansetron (ZOFRAN-ODT) 4  MG disintegrating tablet Take 1 tablet (4 mg total) by mouth every 8 (eight) hours as needed for nausea or vomiting. (Patient not taking: Reported on 02/12/2023) 20 tablet 0   oxyCODONE-acetaminophen (PERCOCET/ROXICET) 5-325 MG tablet Take 1 tablet by mouth every 6 (six) hours as needed for severe pain. (Patient not taking: Reported on 02/12/2023) 6 tablet 0   No facility-administered medications prior to visit.     ROS  Review of Systems  Constitutional:  Negative for activity change and appetite change.  HENT:  Negative for sinus pressure and sore throat.   Respiratory:  Negative for chest tightness, shortness of breath and wheezing.   Cardiovascular:  Negative for chest pain and palpitations.  Gastrointestinal:  Negative for abdominal distention, abdominal pain and constipation.  Genitourinary: Negative.   Musculoskeletal:        See HPI  Psychiatric/Behavioral:  Negative for behavioral problems and dysphoric mood.     Objective:  BP 127/81   Pulse 78   Temp 98.2 F (36.8 C) (Oral)   Ht 5\' 4"  (1.626 m)   Wt 173 lb 12.8 oz (78.8 kg)   LMP 03/15/2011   SpO2 98%   BMI 29.83 kg/m      02/12/2023    2:06 PM 09/26/2022    2:52 PM 05/25/2022   10:00 PM  BP/Weight  Systolic BP 127 121 93  Diastolic BP 81 83 63  Wt. (Lbs) 173.8 175.2   BMI 29.83 kg/m2 30.07 kg/m2       Physical Exam Constitutional:      Appearance: She is well-developed.  Cardiovascular:     Rate and Rhythm: Normal rate.     Heart sounds: Normal heart sounds. No murmur heard. Pulmonary:     Effort: Pulmonary effort is normal.     Breath sounds: Normal breath sounds. No wheezing or rales.  Chest:     Chest wall: No tenderness.  Abdominal:     General: Bowel sounds are normal. There is no distension.     Palpations: Abdomen is soft. There is no mass.     Tenderness: There is no abdominal tenderness.  Musculoskeletal:        General: Normal range of motion.     Right lower leg: No edema.     Left lower  leg: No edema.     Comments: Left upper bicep with soft mass beneath the skin measuring about 1.5 inches, not tender  Neurological:     Mental Status: She is alert and oriented to person, place, and time.  Psychiatric:        Mood and Affect: Mood normal.        Latest Ref Rng & Units 05/25/2022    6:36 PM 02/01/2022    6:14 PM 01/23/2022    4:26 PM  CMP  Glucose 70 - 99 mg/dL 161  89  95   BUN 6 - 20 mg/dL 8  7  5    Creatinine 0.44 - 1.00 mg/dL 0.96  0.45  4.09   Sodium 135 - 145 mmol/L 133  138  141   Potassium 3.5 - 5.1 mmol/L 3.6  3.8  4.5   Chloride 98 - 111 mmol/L 100  107  102   CO2 22 - 32 mmol/L 22  23  23    Calcium 8.9 - 10.3 mg/dL 9.2  9.3  81.1   Total Protein 6.5 - 8.1 g/dL 8.3  7.7  8.0   Total Bilirubin 0.3 - 1.2 mg/dL 0.8  0.9  0.7   Alkaline Phos 38 - 126 U/L 88  92  120   AST 15 - 41 U/L 28  26  44   ALT 0 - 44 U/L 23  17  25      Lipid Panel     Component Value Date/Time   CHOL 195 01/23/2022 1626   TRIG 74 01/23/2022 1626   HDL 102 01/23/2022 1626   CHOLHDL 1.9  01/23/2022 1626   CHOLHDL 3.4 05/08/2015 0414   VLDL 14 05/08/2015 0414   LDLCALC 80 01/23/2022 1626    CBC    Component Value Date/Time   WBC 9.2 05/25/2022 1836   RBC 4.18 05/25/2022 1836   HGB 13.8 05/25/2022 1836   HGB 15.2 01/23/2022 1626   HCT 37.4 05/25/2022 1836   HCT 43.3 01/23/2022 1626   PLT  05/25/2022 1836    PLATELET CLUMPS NOTED ON SMEAR, COUNT APPEARS DECREASED   PLT 153 01/23/2022 1626   MCV 89.5 05/25/2022 1836   MCV 95 01/23/2022 1626   MCH 33.0 05/25/2022 1836   MCHC 36.9 (H) 05/25/2022 1836   RDW 13.6 05/25/2022 1836   RDW 12.5 01/23/2022 1626   LYMPHSABS 2.2 05/25/2022 1836   LYMPHSABS 2.8 01/23/2022 1626   MONOABS 0.9 05/25/2022 1836   EOSABS 0.1 05/25/2022 1836   EOSABS 0.1 01/23/2022 1626   BASOSABS 0.0 05/25/2022 1836   BASOSABS 0.1 01/23/2022 1626    Lab Results  Component Value Date   HGBA1C 5.3 06/06/2021    Assessment & Plan:  1. Lipoma of  other specified sites Will order ultrasound and refer to general surgeon once result is obtained as patient is desirous of excision - [place] LT UPPER EXTREM LTD SOFT TISSUE NON VASCULAR; Future  2. Screening for metabolic disorder - LP+Non-HDL Cholesterol - CMP14+EGFR - CBC with Differential/Platelet - Hemoglobin A1c  3. Substance induced mood disorder (HCC) She has been on risperidone for years Will place referral to psych for optimization and proper diagnosis - Ambulatory referral to Psychiatry  4. Screening for colon cancer - Cologuard   Health Care Maintenance: Referred for low-dose lung CT screen by previous PCP but she is yet to undergo this  No orders of the defined types were placed in this encounter.   Follow-up: Return in about 6 months (around 08/15/2023) for Chronic medical conditions.       Hoy Register, MD, FAAFP. [facility] [place], [place] [phone]   02/12/2023, 4:54 PM

## 2023-02-13 LAB — HEMOGLOBIN A1C
Est. average glucose Bld gHb Est-mCnc: 103 mg/dL
Hgb A1c MFr Bld: 5.2 % (ref 4.8–5.6)

## 2023-02-13 LAB — CBC WITH DIFFERENTIAL/PLATELET
Basophils Absolute: 0 10*3/uL (ref 0.0–0.2)
Basos: 1 %
EOS (ABSOLUTE): 0.2 10*3/uL (ref 0.0–0.4)
Eos: 2 %
Hematocrit: 40.8 % (ref 34.0–46.6)
Hemoglobin: 14.5 g/dL (ref 11.1–15.9)
Immature Grans (Abs): 0 10*3/uL (ref 0.0–0.1)
Immature Granulocytes: 0 %
Lymphocytes Absolute: 2.9 10*3/uL (ref 0.7–3.1)
Lymphs: 40 %
MCH: 33.1 pg — ABNORMAL HIGH (ref 26.6–33.0)
MCHC: 35.5 g/dL (ref 31.5–35.7)
MCV: 93 fL (ref 79–97)
Monocytes Absolute: 0.7 10*3/uL (ref 0.1–0.9)
Monocytes: 10 %
Neutrophils Absolute: 3.3 10*3/uL (ref 1.4–7.0)
Neutrophils: 47 %
Platelets: 127 10*3/uL — ABNORMAL LOW (ref 150–450)
RBC: 4.38 x10E6/uL (ref 3.77–5.28)
RDW: 13.1 % (ref 11.7–15.4)
WBC: 7.1 10*3/uL (ref 3.4–10.8)

## 2023-02-13 LAB — LP+NON-HDL CHOLESTEROL
Cholesterol, Total: 155 mg/dL (ref 100–199)
HDL: 83 mg/dL (ref >39)
LDL Chol Calc (NIH): 57 mg/dL (ref 0–99)
Total Non-HDL-Chol (LDL+VLDL): 72 mg/dL (ref 0–129)
Triglycerides: 79 mg/dL (ref 0–149)
VLDL Cholesterol Cal: 15 mg/dL (ref 5–40)

## 2023-02-13 LAB — CMP14+EGFR
ALT: 26 IU/L (ref 0–32)
AST: 40 IU/L (ref 0–40)
Albumin/Globulin Ratio: 1.3 (ref 1.2–2.2)
Albumin: 4.4 g/dL (ref 3.8–4.9)
Alkaline Phosphatase: 100 IU/L (ref 44–121)
BUN/Creatinine Ratio: 8 — ABNORMAL LOW (ref 9–23)
BUN: 5 mg/dL — ABNORMAL LOW (ref 6–24)
Bilirubin Total: 0.5 mg/dL (ref 0.0–1.2)
CO2: 19 mmol/L — ABNORMAL LOW (ref 20–29)
Calcium: 9.9 mg/dL (ref 8.7–10.2)
Chloride: 101 mmol/L (ref 96–106)
Creatinine, Ser: 0.62 mg/dL (ref 0.57–1.00)
Globulin, Total: 3.4 g/dL (ref 1.5–4.5)
Glucose: 79 mg/dL (ref 70–99)
Potassium: 4.7 mmol/L (ref 3.5–5.2)
Sodium: 141 mmol/L (ref 134–144)
Total Protein: 7.8 g/dL (ref 6.0–8.5)
eGFR: 103 mL/min/{1.73_m2} (ref >59)

## 2023-04-03 ENCOUNTER — Ambulatory Visit (INDEPENDENT_AMBULATORY_CARE_PROVIDER_SITE_OTHER): Payer: Medicaid Other | Admitting: Primary Care

## 2023-05-27 ENCOUNTER — Other Ambulatory Visit (INDEPENDENT_AMBULATORY_CARE_PROVIDER_SITE_OTHER): Payer: Self-pay | Admitting: Primary Care

## 2023-05-27 ENCOUNTER — Other Ambulatory Visit: Payer: Self-pay

## 2023-05-27 DIAGNOSIS — Z760 Encounter for issue of repeat prescription: Secondary | ICD-10-CM

## 2023-05-28 ENCOUNTER — Other Ambulatory Visit (HOSPITAL_COMMUNITY): Payer: Self-pay

## 2023-05-28 ENCOUNTER — Other Ambulatory Visit: Payer: Self-pay

## 2023-05-28 MED ORDER — RISPERIDONE 1 MG PO TABS
1.0000 mg | ORAL_TABLET | Freq: Every day | ORAL | 1 refills | Status: DC
  Filled 2023-05-28 – 2023-06-13 (×2): qty 30, 30d supply, fill #0
  Filled 2023-07-25: qty 30, 30d supply, fill #1

## 2023-05-28 NOTE — Telephone Encounter (Signed)
Needs appt

## 2023-05-29 ENCOUNTER — Other Ambulatory Visit (HOSPITAL_COMMUNITY): Payer: Self-pay

## 2023-06-02 ENCOUNTER — Other Ambulatory Visit: Payer: Self-pay

## 2023-06-06 ENCOUNTER — Other Ambulatory Visit: Payer: Self-pay

## 2023-06-09 ENCOUNTER — Other Ambulatory Visit: Payer: Self-pay

## 2023-06-13 ENCOUNTER — Other Ambulatory Visit: Payer: Self-pay

## 2023-06-25 ENCOUNTER — Ambulatory Visit (INDEPENDENT_AMBULATORY_CARE_PROVIDER_SITE_OTHER): Payer: Medicaid Other | Admitting: Primary Care

## 2023-06-25 ENCOUNTER — Ambulatory Visit: Payer: Medicaid Other | Admitting: Physician Assistant

## 2023-07-02 ENCOUNTER — Encounter (INDEPENDENT_AMBULATORY_CARE_PROVIDER_SITE_OTHER): Payer: Self-pay

## 2023-07-02 ENCOUNTER — Ambulatory Visit (INDEPENDENT_AMBULATORY_CARE_PROVIDER_SITE_OTHER): Payer: MEDICAID | Admitting: Primary Care

## 2023-07-02 VITALS — BP 121/84 | HR 98 | Resp 16 | Ht 64.0 in | Wt 181.0 lb

## 2023-07-02 DIAGNOSIS — Z23 Encounter for immunization: Secondary | ICD-10-CM

## 2023-07-05 NOTE — Progress Notes (Signed)
Vaccine only

## 2023-07-25 ENCOUNTER — Telehealth: Payer: Self-pay

## 2023-07-25 ENCOUNTER — Other Ambulatory Visit: Payer: Self-pay

## 2023-07-25 ENCOUNTER — Other Ambulatory Visit (INDEPENDENT_AMBULATORY_CARE_PROVIDER_SITE_OTHER): Payer: Self-pay | Admitting: Primary Care

## 2023-07-25 DIAGNOSIS — Z760 Encounter for issue of repeat prescription: Secondary | ICD-10-CM

## 2023-07-25 NOTE — Telephone Encounter (Signed)
Medication Refill - Medication: risperidone 1 mg  Has the patient contacted their pharmacy? Yes.   (Agent: If no, request that the patient contact the pharmacy for the refill. If patient does not wish to contact the pharmacy document the reason why and proceed with request.) (Agent: If yes, when and what did the pharmacy advise?)  Preferred Pharmacy (with phone number or street name): [facility] Has the patient been seen for an appointment in the last year OR does the patient have an upcoming appointment? Yes.    Agent: Please be advised that RX refills may take up to 3 business days. We ask that you follow-up with your pharmacy.

## 2023-07-25 NOTE — Telephone Encounter (Signed)
Will forward to provider  

## 2023-07-25 NOTE — Telephone Encounter (Signed)
Pharmacy Patient Advocate Encounter   Received notification from CoverMyMeds that prior authorization for RISPERIDONE is required/requested.   Insurance verification completed.   The patient is insured through [facility] .   Per test claim: PA required; PA started via CoverMyMeds. KEY [id number] . Waiting for clinical questions to populate.

## 2023-07-25 NOTE — Telephone Encounter (Signed)
Requested medication (s) are due for refill today: yes  Requested medication (s) are on the active medication list: yes  Last refill:  05/28/23 #30/1  Future visit scheduled: no  Notes to clinic:  Unable to refill per protocol, cannot delegate.      Requested Prescriptions  Pending Prescriptions Disp Refills   risperiDONE (RISPERDAL) 1 MG tablet 30 tablet 1    Sig: Take 1 tablet (1 mg total) by mouth at bedtime. Needs appointment for futher refills.     Not Delegated - Psychiatry:  Antipsychotics - Second Generation (Atypical) - risperidone Failed - 07/25/2023 12:35 PM      Failed - This refill cannot be delegated      Failed - TSH in normal range and within 360 days    TSH  Date Value Ref Range Status  06/06/2021 3.002 0.350 - 4.500 uIU/mL Final    Comment:    Performed by a 3rd Generation assay with a functional sensitivity of <=0.01 uIU/mL. Performed at [facility] [place] [facility], [address], [place], [place] [address]   03/19/2021 1.180 0.450 - 4.500 uIU/mL Final         Failed - Lipid Panel in normal range within the last 12 months    Cholesterol, Total  Date Value Ref Range Status  02/12/2023 155 100 - 199 mg/dL Final   LDL Chol Calc (NIH)  Date Value Ref Range Status  02/12/2023 57 0 - 99 mg/dL Final   HDL  Date Value Ref Range Status  02/12/2023 83 >39 mg/dL Final   Triglycerides  Date Value Ref Range Status  02/12/2023 79 0 - 149 mg/dL Final         Failed - CMP within normal limits and completed in the last 12 months    Albumin  Date Value Ref Range Status  02/12/2023 4.4 3.8 - 4.9 g/dL Final   Alkaline Phosphatase  Date Value Ref Range Status  02/12/2023 100 44 - 121 IU/L Final   ALT  Date Value Ref Range Status  02/12/2023 26 0 - 32 IU/L Final   AST  Date Value Ref Range Status  02/12/2023 40 0 - 40 IU/L Final   BUN  Date Value Ref Range Status  02/12/2023 5 (L) 6 - 24 mg/dL Final   Calcium  Date Value Ref Range Status   02/12/2023 9.9 8.7 - 10.2 mg/dL Final   Calcium, Ion  Date Value Ref Range Status  05/07/2015 1.19 1.12 - 1.23 mmol/L Final   CO2  Date Value Ref Range Status  02/12/2023 19 (L) 20 - 29 mmol/L Final   TCO2  Date Value Ref Range Status  05/07/2015 19 0 - 100 mmol/L Final   Creatinine, Ser  Date Value Ref Range Status  02/12/2023 0.62 0.57 - 1.00 mg/dL Final   Glucose  Date Value Ref Range Status  02/12/2023 79 70 - 99 mg/dL Final   Glucose, Bld  Date Value Ref Range Status  05/25/2022 105 (H) 70 - 99 mg/dL Final    Comment:    Glucose reference range applies only to samples taken after fasting for at least 8 hours.   Glucose-Capillary  Date Value Ref Range Status  06/08/2021 138 (H) 70 - 99 mg/dL Final    Comment:    Glucose reference range applies only to samples taken after fasting for at least 8 hours.   Potassium  Date Value Ref Range Status  02/12/2023 4.7 3.5 - 5.2 mmol/L Final   Sodium  Date Value Ref Range Status  02/12/2023 141 134 - 144 mmol/L Final   Bilirubin Total  Date Value Ref Range Status  02/12/2023 0.5 0.0 - 1.2 mg/dL Final   Bilirubin, Direct  Date Value Ref Range Status  05/25/2022 0.2 0.0 - 0.2 mg/dL Final   Indirect Bilirubin  Date Value Ref Range Status  05/25/2022 0.6 0.3 - 0.9 mg/dL Final    Comment:    Performed at [facility], [address], [place], [place] [address]   Protein, ur  Date Value Ref Range Status  05/25/2022 NEGATIVE NEGATIVE mg/dL Final   Total Protein  Date Value Ref Range Status  02/12/2023 7.8 6.0 - 8.5 g/dL Final   GFR calc Af Amer  Date Value Ref Range Status  05/03/2020 >60 >60 mL/min Final   eGFR  Date Value Ref Range Status  02/12/2023 103 >59 mL/min/1.73 Final   GFR, Estimated  Date Value Ref Range Status  05/25/2022 >60 >60 mL/min Final    Comment:    (NOTE) Calculated using the CKD-EPI Creatinine Equation (2021)          Passed - Completed PHQ-2 or PHQ-9 in the  last 360 days      Passed - Last BP in normal range    BP Readings from Last 1 Encounters:  07/02/23 121/84         Passed - Last Heart Rate in normal range    Pulse Readings from Last 1 Encounters:  07/02/23 98         Passed - Valid encounter within last 6 months    Recent Outpatient Visits           3 weeks ago Encounter for immunization   [place] [facility] Grayce Sessions, NP   5 months ago Lipoma of other specified sites   [facility] [place], Odette Horns, MD   10 months ago Colon cancer screening   [place] [facility] Grayce Sessions, NP   1 year ago Hypokalemia   [place] [facility] Grayce Sessions, NP   1 year ago Need for immunization against influenza   [place] [facility] Grayce Sessions, NP              Passed - CBC within normal limits and completed in the last 12 months    WBC  Date Value Ref Range Status  02/12/2023 7.1 3.4 - 10.8 x10E3/uL Final  05/25/2022 9.2 4.0 - 10.5 K/uL Final   RBC  Date Value Ref Range Status  02/12/2023 4.38 3.77 - 5.28 x10E6/uL Final  05/25/2022 4.18 3.87 - 5.11 MIL/uL Final   Hemoglobin  Date Value Ref Range Status  02/12/2023 14.5 11.1 - 15.9 g/dL Final   Hematocrit  Date Value Ref Range Status  02/12/2023 40.8 34.0 - 46.6 % Final   MCHC  Date Value Ref Range Status  02/12/2023 35.5 31.5 - 35.7 g/dL Final  60/45/4098 11.9 (H) 30.0 - 36.0 g/dL Final   [facility]  Date Value Ref Range Status  02/12/2023 33.1 (H) 26.6 - 33.0 pg Final  05/25/2022 33.0 26.0 - 34.0 pg Final   MCV  Date Value Ref Range Status  02/12/2023 93 79 - 97 fL Final   No results found for: "PLTCOUNTKUC", "LABPLAT", "POCPLA" RDW  Date Value Ref Range Status  02/12/2023 13.1 11.7 - 15.4 % Final

## 2023-07-29 ENCOUNTER — Other Ambulatory Visit: Payer: Self-pay

## 2023-07-29 MED ORDER — RISPERIDONE 1 MG PO TABS
1.0000 mg | ORAL_TABLET | Freq: Every day | ORAL | 1 refills | Status: DC
  Filled 2023-07-29: qty 30, 30d supply, fill #0

## 2023-07-30 ENCOUNTER — Other Ambulatory Visit: Payer: Self-pay

## 2023-08-18 ENCOUNTER — Telehealth (INDEPENDENT_AMBULATORY_CARE_PROVIDER_SITE_OTHER): Payer: Self-pay | Admitting: Primary Care

## 2023-08-18 NOTE — Telephone Encounter (Signed)
 Left VM with pt about upcoming apt.

## 2023-08-19 ENCOUNTER — Encounter (INDEPENDENT_AMBULATORY_CARE_PROVIDER_SITE_OTHER): Payer: Self-pay | Admitting: Primary Care

## 2023-08-19 ENCOUNTER — Other Ambulatory Visit: Payer: Self-pay

## 2023-08-19 ENCOUNTER — Ambulatory Visit (INDEPENDENT_AMBULATORY_CARE_PROVIDER_SITE_OTHER): Payer: MEDICAID | Admitting: Primary Care

## 2023-08-19 VITALS — BP 131/87 | HR 71 | Resp 16 | Wt 180.6 lb

## 2023-08-19 DIAGNOSIS — F17210 Nicotine dependence, cigarettes, uncomplicated: Secondary | ICD-10-CM | POA: Diagnosis not present

## 2023-08-19 DIAGNOSIS — M25512 Pain in left shoulder: Secondary | ICD-10-CM

## 2023-08-19 DIAGNOSIS — G8929 Other chronic pain: Secondary | ICD-10-CM

## 2023-08-19 DIAGNOSIS — K140 Glossitis: Secondary | ICD-10-CM

## 2023-08-19 DIAGNOSIS — Z760 Encounter for issue of repeat prescription: Secondary | ICD-10-CM

## 2023-08-19 DIAGNOSIS — M25511 Pain in right shoulder: Secondary | ICD-10-CM

## 2023-08-19 DIAGNOSIS — F17200 Nicotine dependence, unspecified, uncomplicated: Secondary | ICD-10-CM

## 2023-08-19 MED ORDER — RISPERIDONE 1 MG PO TABS
1.0000 mg | ORAL_TABLET | Freq: Every day | ORAL | 1 refills | Status: DC
  Filled 2023-08-19: qty 90, 90d supply, fill #0
  Filled 2023-11-26: qty 90, 90d supply, fill #1

## 2023-08-19 NOTE — Progress Notes (Signed)
[facility]  Janet Ramos, is a 62 y.o. female  IHK:[id number]  VFI:[id number]  DOB - 06/08/1961  Chief Complaint  Patient presents with   Shoulder Pain       Subjective:   Janet Ramos is a 62 y.o. female here today for an acute visit she has bilateral shoulder pain and needs a referral to orthopedics. She also, voices concerns with a bump on her tongue that she had for years that has recently started to hurt. Pain  7/10 OTC ibuprofen little relief.  Patient has No headache, No chest pain, No abdominal pain - No Nausea, No new weakness tingling or numbness, No Cough - shortness of breath  No problems updated.  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 90 tablet 3   ibuprofen (ADVIL) 800 MG tablet Take 1 tablet (800 mg total) by mouth every 8 (eight) hours as needed. 90 tablet 0   No current facility-administered medications on file prior to visit.    Objective:   Vitals:   08/19/23 1459  BP: 131/87  Pulse: 71  Resp: 16  SpO2: 97%  Weight: 180 lb 9.6 oz (81.9 kg)     Comprehensive ROS Pertinent positive and negative noted in HPI   Exam General appearance : Awake, alert, not in any distress. Speech Clear. Not toxic looking HEENT: Atraumatic and Normocephalic, pupils equally reactive to light and accomodation Neck: Supple, no JVD. No cervical lymphadenopathy.  Chest: Good air entry bilaterally, no added sounds  CVS: S1 S2 regular, no murmurs.  Abdomen: Bowel sounds present, Non tender and not distended with no gaurding, rigidity or rebound. Extremities: B/L Lower Ext shows no edema, both legs are warm to touch Neurology: Awake alert, and oriented X 3, CN II-XII intact, Non focal Skin: No Rash  Data Review Lab Results  Component Value Date   HGBA1C 5.2 02/12/2023   HGBA1C 5.3 06/06/2021   HGBA1C 5.3 05/07/2015    Assessment & Plan   Seymone was seen today for shoulder pain.  Diagnoses and all orders for this visit:  Chronic pain of both shoulders -     Ambulatory referral to Orthopedic Surgery  Tobacco dependence - I have recommended complete cessation of tobacco use. I have discussed various options available for assistance with tobacco cessation including over the counter methods (Nicotine gum, patch and lozenges). We also discussed prescription options (Chantix, Nicotine Inhaler / Nasal Spray).  The patient is not interested in pursuing any prescription tobacco cessation options at this time. - Patient declines at this time.  - Less than 5 minutes spent on counseling.   Transient lingual papillitis -     Ambulatory referral to Oral Maxillofacial Surgery     Patient have been counseled extensively about nutrition and exercise. Other issues discussed during this visit include: low cholesterol diet, weight control and daily exercise, foot care, annual eye examinations at Ophthalmology, importance of adherence with medications and regular follow-up. We also discussed long term complications of uncontrolled diabetes and hypertension.      The patient was given clear instructions to go to ER or return to medical center if symptoms don't improve, worsen or new problems develop. The patient verbalized understanding. The patient was told to call to get lab results if they haven't heard anything in the next week.   This note has been created with Education officer, environmental. Any transcriptional errors are unintentional.   Grayce Sessions, NP 08/19/2023, 4:03 PM

## 2023-08-20 ENCOUNTER — Other Ambulatory Visit: Payer: Self-pay

## 2023-09-01 ENCOUNTER — Encounter: Payer: Self-pay | Admitting: Physician Assistant

## 2023-09-01 ENCOUNTER — Other Ambulatory Visit: Payer: Self-pay | Admitting: Radiology

## 2023-09-01 ENCOUNTER — Other Ambulatory Visit (INDEPENDENT_AMBULATORY_CARE_PROVIDER_SITE_OTHER): Payer: MEDICAID

## 2023-09-01 ENCOUNTER — Ambulatory Visit (INDEPENDENT_AMBULATORY_CARE_PROVIDER_SITE_OTHER): Payer: MEDICAID | Admitting: Physician Assistant

## 2023-09-01 DIAGNOSIS — M542 Cervicalgia: Secondary | ICD-10-CM | POA: Diagnosis not present

## 2023-09-01 DIAGNOSIS — M25511 Pain in right shoulder: Secondary | ICD-10-CM | POA: Diagnosis not present

## 2023-09-01 DIAGNOSIS — G8929 Other chronic pain: Secondary | ICD-10-CM

## 2023-09-01 DIAGNOSIS — M25512 Pain in left shoulder: Secondary | ICD-10-CM

## 2023-09-01 DIAGNOSIS — R2230 Localized swelling, mass and lump, unspecified upper limb: Secondary | ICD-10-CM

## 2023-09-01 MED ORDER — METHYLPREDNISOLONE 4 MG PO TABS: ORAL_TABLET | ORAL | 0 refills | Status: AC

## 2023-09-01 NOTE — Progress Notes (Signed)
HPI: Janet Ramos comes in today due to bilateral shoulder pain.  She last saw Dr. Magnus Ivan in 2018.  She notes she has a knot on her left anterior shoulder that is growing in size.  She states it is doubled in size in the last year.  No injury to the shoulder.  She is also having right shoulder scapular pain.  Numbness tingling from the right shoulder to the elbow.  Notes it is painful to lift objects with her right arm.  Denies any numbness tingling down the left arm.  Denies any neck pain.  No acute injury.  Review of systems: Denies any weight loss fevers or chills.  Physical exam: General Well-developed well-nourished female no acute distress.  Affect appropriate. Psych: Alert and oriented x 3 Cervical spine good range of motion without pain.  Positive Spurling's.  Tenderness medial border right scapula only. Bilateral shoulders 5/5 strength with external and internal rotation against resistance.  Impingement testing is negative bilaterally.  Liftoff test and empty can test negative bilaterally. Upper extremities: 5 out of 5 strength throughout.  Sensory.  Subjective normal sensation throughout both hands to light touch.  Full motor full bilateral hands.  Radial pulses 2+ equal symmetric.  Left shoulder anterior to lateral aspect palpable nodule present.  Radiographs:Cervical spine 2 views: Loss of lordotic curvature.  Anterior disc spurring at multiple levels.  C5-C6 almost completely fused due to vertebral spurring.  No loss of disc space.  No spondylolisthesis.  No fracture or findings.    Impression: Cervicalgia with right radicular symptoms Left shoulder soft tissue mass   Plan: Will send her to formal physical therapy for her neck they will work on range of motion and home exercise program.  Modalities.  Regards to the left shoulder mass we will attain a MRI with and without contrast to evaluate the soft tissue mass.  Have her follow-up after the MRI to go over results discuss further  treatment.  She is placed on a Medrol Dosepak no NSAIDs while on the Dosepak.  Questions were encouraged and answered at length.

## 2023-09-24 ENCOUNTER — Other Ambulatory Visit: Payer: Self-pay

## 2023-09-24 ENCOUNTER — Ambulatory Visit: Payer: MEDICAID | Attending: Physician Assistant

## 2023-09-24 DIAGNOSIS — R293 Abnormal posture: Secondary | ICD-10-CM | POA: Diagnosis present

## 2023-09-24 DIAGNOSIS — M542 Cervicalgia: Secondary | ICD-10-CM | POA: Insufficient documentation

## 2023-09-24 DIAGNOSIS — M6281 Muscle weakness (generalized): Secondary | ICD-10-CM | POA: Diagnosis present

## 2023-09-24 NOTE — Therapy (Signed)
OUTPATIENT PHYSICAL THERAPY CERVICAL EVALUATION   Patient Name: Janet Ramos MRN: [id number] DOB:June 18, 1961, 62 y.o., female Today's Date: 09/24/2023  END OF SESSION:  PT End of Session - 09/24/23 1549     Visit Number 1    Number of Visits 9    Date for PT Re-Evaluation 11/19/23    Authorization Type [employer]    PT Start Time 1500   arrived late   PT Stop Time 1530    PT Time Calculation (min) 30 min    Activity Tolerance Patient tolerated treatment well    Behavior During Therapy [facility] for tasks assessed/performed             Past Medical History:  Diagnosis Date   Alcohol withdrawal (HCC) 06/06/2021   with hospital admission   Alcoholism [facility])    dependance w/ withdrawal syndrome including seizures   Anemia    Bipolar 1 disorder (HCC)    Cocaine abuse (HCC)    no cocaine used in 6 years per pt on 10-05-2021   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   left eye injury 09-16-2014  s/p ruptured globe repair;   09-23-2014 s/p enucleation w/ implant   Fatty liver    with elevated LFTs , 06-06-2021   History of gastritis 05/2021   per EGD   History of seizure due to alcohol withdrawal    2020  none since per pt   History of trichomoniasis 2010   Hyperlipidemia, mixed    Seizures (HCC)    last time 4 years ago per pt   Thickened endometrium    Wears glasses    Past Surgical History:  Procedure Laterality Date   CERVICAL BIOPSY  2022   CESAREAN SECTION  1990   DILATATION & CURETTAGE/HYSTEROSCOPY WITH MYOSURE  10/10/2021   Procedure: DILATATION & CURETTAGE/HYSTEROSCOPY WITH MYOSURE,polyectomy;  Surgeon: Harrington Bing, MD;  Location: [facility] [place];  Service: Gynecology;;   ENUCLEATION Left 09/23/2014   @[facility] ;  w/ implant   ESOPHAGOGASTRODUODENOSCOPY N/A 06/07/2021   Procedure: ESOPHAGOGASTRODUODENOSCOPY (EGD);  Surgeon: Jeani Hawking, MD;  Location: [facility];  Service: Endoscopy;  Laterality: N/A;  Epigastric pain with nausea vomiting and 40 pound  weight loss   EYE SURGERY Right 06/2015   cyst removal from medial canthus   HYSTEROSCOPY WITH D & C N/A 10/10/2021   Procedure: DILATATION AND CURETTAGE /HYSTEROSCOPY;  Surgeon: Lewisberry Bing, MD;  Location: [facility];  Service: Gynecology;  Laterality: N/A;   RUPTURED GLOBE EXPLORATION AND REPAIR Left 09/16/2014   Procedure:  Ruptured Globe Repair Left Eye;  Surgeon: French Ana, MD;  Location: [facility] OR;  Service: Ophthalmology;  Laterality: Left;   TUBAL LIGATION Bilateral 1994   Patient Active Problem List   Diagnosis Date Noted   Malnutrition of moderate degree 06/07/2021   Abdominal pain 06/06/2021   Nausea and vomiting 06/06/2021   Hematemesis 06/06/2021   Tobacco abuse 06/06/2021   Weight loss 06/06/2021   Alcohol withdrawal syndrome with complication, with unspecified complication (HCC) 07/06/2019   Substance induced mood disorder (HCC) 10/31/2017   Left knee pain 08/28/2015   Headache 05/08/2015   Hyperlipidemia 05/08/2015   Hypokalemia 05/08/2015   Right sided weakness 05/07/2015   Syncope 05/07/2015   Polysubstance abuse (HCC) 05/07/2015   Rupture of globe 09/16/2014    PCP: Grayce Sessions, NP  REFERRING PROVIDER: Kirtland Bouchard, PA-C  REFERRING DIAG: M54.2 (ICD-10-CM) - Cervicalgia   THERAPY DIAG:  Cervicalgia - Plan: PT plan of care cert/re-cert  Abnormal posture - Plan: PT plan of care cert/re-cert  Muscle weakness (generalized) - Plan: PT plan of care cert/re-cert  Rationale for Evaluation and Treatment: Rehabilitation  ONSET DATE: Chronic  SUBJECTIVE:                                                                                                                                                                                                         SUBJECTIVE STATEMENT: Pt presents to PT with reports of chronic L sided neck pain. Denies bowel/bladder changes or saddle anesthesia. Has occasional N/T down L UE to hand. Has  difficulty with turning to L and with reaching OH with L UE.   Hand dominance: Right  PERTINENT HISTORY:  See PMH  PAIN:  Are you having pain?  Yes: NPRS scale: 6/10 Worst: 10/10 Pain location: L cervical, L upper trap Pain description: sharp, tight Aggravating factors: reaching overhead, turning head L Relieving factors: heat, medication  PRECAUTIONS: None  RED FLAGS: None    WEIGHT BEARING RESTRICTIONS: No  FALLS:  Has patient fallen in last 6 months? No  LIVING ENVIRONMENT: Lives with: lives with their family Lives in: House/apartment Stairs: No Has following equipment at home: Single point cane  OCCUPATION: Not currently working  PLOF: Independent  PATIENT GOALS: decrease neck pain, be able to walk and dress better without pain  NEXT MD VISIT: 02/16/2023  OBJECTIVE:  Note: Objective measures were completed at Evaluation unless otherwise noted.  DIAGNOSTIC FINDINGS:  See imaging  PATIENT SURVEYS:  FOTO: 47% function; 58% predicted  COGNITION: Overall cognitive status: Within functional limits for tasks assessed  SENSATION: Light touch: Impaired - L UE  POSTURE: rounded shoulders and forward head  PALPATION: TTP L upper trap   CERVICAL ROM:   Active ROM A/PROM (deg) eval  Flexion   Extension   Right lateral flexion   Left lateral flexion   Right rotation 45  Left rotation 29   (Blank rows = not tested)  UPPER EXTREMITY ROM:  Active ROM Right eval Left eval  Shoulder flexion WFL 110  Shoulder extension    Shoulder abduction    Shoulder adduction    Shoulder extension    Shoulder internal rotation    Shoulder external rotation    Elbow flexion    Elbow extension    Wrist flexion    Wrist extension    Wrist ulnar deviation    Wrist radial deviation    Wrist pronation    Wrist supination     (Blank rows = not tested)  UPPER EXTREMITY MMT:  MMT Right eval Left eval  Shoulder flexion 3/5 3/5  Shoulder extension    Shoulder  abduction    Shoulder adduction    Shoulder extension    Shoulder internal rotation    Shoulder external rotation    Middle trapezius 3/5 3/5  Lower trapezius 3/5 3/5  Elbow flexion    Elbow extension    Wrist flexion    Wrist extension    Wrist ulnar deviation    Wrist radial deviation    Wrist pronation    Wrist supination    Grip strength     (Blank rows = not tested)  CERVICAL SPECIAL TESTS:  DNT  FUNCTIONAL TESTS:  Cervical Flexor Endurance Test: 10 seconds  TREATMENT: [facility] Adult PT Treatment:                                                DATE: 09/24/2023 Therapeutic Exercise: L upper trap stretch x 30" Row x 10 RTB Supine chin tuck x 5  PATIENT EDUCATION:  Education details: eval findings, FOTO, HEP, POC Person educated: Patient Education method: Explanation, Demonstration, and Handouts Education comprehension: verbalized understanding and returned demonstration  HOME EXERCISE PROGRAM: Access Code: [email or web address] URL: [email or web address] Date: 09/24/2023 Prepared by: Edwinna Areola  Exercises - Seated Upper Trapezius Stretch (Mirrored)  - 1 x daily - 7 x weekly - 2 reps - 20 sec hold - Standing Shoulder Row with Anchored Resistance  - 1 x daily - 7 x weekly - 3 sets - 10 reps - Supine Chin Tuck  - 1 x daily - 7 x weekly - 2 sets - 10 reps - 5 sec hold  ASSESSMENT:  CLINICAL IMPRESSION: Patient is a 62 y.o. F who was seen today for physical therapy evaluation and treatment for chronic L sided neck pain. Physical findings are consistent with referring provider impression as pt demonstrates decrease in cervical ROM and postural muscle weakness. Her FOTO score shows she is operating below PLOF for subjective functional ability. She would benefit from skilled PT services working on improving postural muscle strength and cervical ROM.    OBJECTIVE IMPAIRMENTS: decreased activity tolerance, decreased balance, decreased endurance, decreased mobility,  difficulty walking, decreased ROM, decreased strength, impaired UE functional use, improper body mechanics, postural dysfunction, and pain  ACTIVITY LIMITATIONS: carrying, lifting, dressing, reach over head, and hygiene/grooming  PARTICIPATION LIMITATIONS: meal prep, cleaning, driving, shopping, community activity, occupation, and yard work  PERSONAL FACTORS: Fitness and Time since onset of injury/illness/exacerbation are also affecting patient's functional outcome.   REHAB POTENTIAL: Good  CLINICAL DECISION MAKING: Stable/uncomplicated  EVALUATION COMPLEXITY: Low   GOALS: Goals reviewed with patient? No  SHORT TERM GOALS: Target date: 10/15/2023   Pt will be compliant and knowledgeable with initial HEP for improved comfort and carryover Baseline: initial HEP given  Goal status: INITIAL  2.  Pt will self report left sided neck pain no greater than 7/10 for improved comfort and functional ability Baseline: 10/10 at worst Goal status: INITIAL   LONG TERM GOALS: Target date: 11/19/2023   Pt will improve FOTO function score to no less than 58% as proxy for functional improvement Baseline: 47% function Goal status: INITIAL   2.  Pt will self report L sided neck pain no greater than 3/10 for improved comfort and functional ability Baseline: 10/10 at worst Goal status: INITIAL   3.  Pt will improve bilateral cervical rotation ROM to no less than 60 degrees for improved functional ability with dressing, driving and other ADLs Baseline: see ROM chart Goal status: INITIAL  4.  Pt will improve L shoulder flexion to no less than 140 degrees for improved functional ability and ADL performance Baseline:  Goal status: INITIAL   PLAN:  PT FREQUENCY: 1x/week  PT DURATION: 8 weeks  PLANNED INTERVENTIONS: 97164- PT Re-evaluation, 97110-Therapeutic exercises, 97530- Therapeutic activity, 97112- Neuromuscular re-education, 97535- Self Care, 29562- Manual therapy, 97014- Electrical  stimulation (unattended), [id number]- Electrical stimulation (manual), Dry Needling, Cryotherapy, and Moist heat  PLAN FOR NEXT SESSION: assess HEP response, periscapular and DNF strengthening, manual to L upper trap  For all possible CPT codes, reference the Planned Interventions line above.     Check all conditions that are expected to impact treatment: {Conditions expected to impact treatment:Musculoskeletal disorders   If treatment provided at initial evaluation, no treatment charged due to lack of authorization.       Eloy End, PT 09/24/2023, 3:50 PM

## 2023-10-16 ENCOUNTER — Ambulatory Visit
Admission: RE | Admit: 2023-10-16 | Discharge: 2023-10-16 | Disposition: A | Discharge status: Home / Self Care | Payer: MEDICAID | Source: Ambulatory Visit | Attending: Physician Assistant | Admitting: Physician Assistant

## 2023-10-16 ENCOUNTER — Inpatient Hospital Stay: Admission: RE | Admit: 2023-10-16 | Payer: MEDICAID | Source: Ambulatory Visit

## 2023-10-16 ENCOUNTER — Other Ambulatory Visit: Payer: MEDICAID

## 2023-10-16 DIAGNOSIS — R2230 Localized swelling, mass and lump, unspecified upper limb: Secondary | ICD-10-CM

## 2023-10-17 ENCOUNTER — Ambulatory Visit: Payer: MEDICAID

## 2023-10-22 ENCOUNTER — Ambulatory Visit (INDEPENDENT_AMBULATORY_CARE_PROVIDER_SITE_OTHER): Payer: Self-pay

## 2023-10-22 ENCOUNTER — Telehealth: Payer: Self-pay | Admitting: Orthopaedic Surgery

## 2023-10-22 NOTE — Telephone Encounter (Signed)
 Called patient to schedule mri review with Dr. Magnus Ivan or Bronson Curb. Left message to give [place] a call back.

## 2023-10-22 NOTE — Telephone Encounter (Signed)
 Returned pt call and made aware that per provider since ortho order the MRI she will need to reach out to them in regards to this

## 2023-10-22 NOTE — Telephone Encounter (Signed)
 Chief Complaint: Medication Question  Symptoms: Claustrophobic   Disposition: [] ED /[] Urgent Care (no appt availability in office) / [] Appointment(In office/virtual)/ []  [place] [facility]/ [] Home Care/ [] Refused Recommended Disposition /[] [place] [facility]/ [x]  Follow-up with PCP Additional Notes: Patient went to have an MRI done of her shoulder but could not tolerate lying in the machine due to being claustrophobic. Patient reports she needed a sedative years ago last time she had an MRI done. Patient is requesting a one time supply of a sedative so she can reschedule the MRI and tolerate lying in the machine better next time. Care advice given and since the patient has not been prescribed a sedative by PCP an appointment was offered today. Patient states she can not come in at the time available due to having to pick grandchild up from school. Patient request asking PCP if she can send the Rx to [facility] [address] in [place] without needing an appointment.  Advised I would forward request to PCP for approval.   Summary: requesting sedative to be able to reschedule MRI   Pt stated she was unable to do her MRI because she is claustrophobic. Pt stated she was asked to call PCP and ask if she could possibly give her a sedative to help her; this way she can reschedule her MRI.  Seeking clinical advice.     Reason for Disposition  Prescription request for new medicine (not a refill)  Answer Assessment - Initial Assessment Questions 1. NAME of MEDICINE: What medicine(s) are you calling about?     Sedative  2. QUESTION: What is your question? (e.g., double dose of medicine, side effect)     Can I be prescribed a sedative medication to have my MRI done?  3. PRESCRIBER: Who prescribed the medicine? Reason: if prescribed by specialist, call should be referred to that group.     New Request  4. SYMPTOMS: Do you have any symptoms? If Yes, ask: What symptoms are you  having?  How bad are the symptoms (e.g., mild, moderate, severe)     Claustrophobic  Protocols used: Medication Question Call-A-AH

## 2023-10-23 ENCOUNTER — Telehealth: Payer: Self-pay | Admitting: Physician Assistant

## 2023-10-23 ENCOUNTER — Other Ambulatory Visit: Payer: Self-pay | Admitting: Physician Assistant

## 2023-10-23 MED ORDER — DIAZEPAM 5 MG PO TABS
ORAL_TABLET | ORAL | 0 refills | Status: DC
  Filled 2023-10-23: qty 2, 1d supply, fill #0
  Filled 2024-03-17: qty 2, 2d supply, fill #0

## 2023-10-23 MED ORDER — DIAZEPAM 5 MG PO TABS: ORAL_TABLET | ORAL | 0 refills | Status: DC

## 2023-10-23 NOTE — Telephone Encounter (Signed)
 Patient called. Would like something to take before having the MRI to calm her nerves. Her cb# [phone]

## 2023-10-24 ENCOUNTER — Other Ambulatory Visit: Payer: Self-pay

## 2023-10-24 ENCOUNTER — Ambulatory Visit: Payer: MEDICAID | Attending: Physician Assistant

## 2023-10-31 ENCOUNTER — Ambulatory Visit: Payer: MEDICAID

## 2023-11-05 ENCOUNTER — Other Ambulatory Visit: Payer: Self-pay

## 2023-11-07 ENCOUNTER — Ambulatory Visit: Payer: MEDICAID

## 2023-11-07 NOTE — Therapy (Incomplete)
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   left eye injury 09-16-2014  s/p ruptured globe repair;   09-23-2014 s/p enucleation w/ implant   Fatty liver    with elevated LFTs , 06-06-2021   History of gastritis 05/2021   per EGD   History of seizure due to alcohol withdrawal    2020  none since per pt   History of trichomoniasis 2010   Hyperlipidemia, mixed    Seizures (HCC)    last time 4 years ago per pt   Thickened endometrium    Wears glasses    Past Surgical History:  Procedure Laterality Date   CERVICAL BIOPSY  2022   CESAREAN SECTION  1990   DILATATION & CURETTAGE/HYSTEROSCOPY WITH MYOSURE  10/10/2021   Procedure: DILATATION & CURETTAGE/HYSTEROSCOPY WITH MYOSURE,polyectomy;  Surgeon: Smithfield Bing, MD;  Location: [facility] [place];  Service: Gynecology;;   ENUCLEATION Left 09/23/2014   @[facility] ;  w/ implant   ESOPHAGOGASTRODUODENOSCOPY N/A 06/07/2021   Procedure: ESOPHAGOGASTRODUODENOSCOPY (EGD);  Surgeon: Jeani Hawking, MD;  Location: [facility];  Service: Endoscopy;  Laterality: N/A;  Epigastric pain with nausea vomiting and 40 pound weight loss   EYE SURGERY Right 06/2015   cyst removal from medial canthus   HYSTEROSCOPY WITH D & C N/A 10/10/2021   Procedure: DILATATION AND CURETTAGE /HYSTEROSCOPY;  Surgeon: Eclectic Bing, MD;  Location: [facility];  Service: Gynecology;  Laterality: N/A;   RUPTURED GLOBE EXPLORATION AND REPAIR Left 09/16/2014   Procedure:  Ruptured Globe Repair Left Eye;   Surgeon: French Ana, MD;  Location: [facility] OR;  Service: Ophthalmology;  Laterality: Left;   TUBAL LIGATION Bilateral 1994   Patient Active Problem List   Diagnosis Date Noted   Malnutrition of moderate degree 06/07/2021   Abdominal pain 06/06/2021   Nausea and vomiting 06/06/2021   Hematemesis 06/06/2021   Tobacco abuse 06/06/2021   Weight loss 06/06/2021   Alcohol withdrawal syndrome with complication, with unspecified complication (HCC) 07/06/2019   Substance induced mood disorder (HCC) 10/31/2017   Left knee pain 08/28/2015   Headache 05/08/2015   Hyperlipidemia 05/08/2015   Hypokalemia 05/08/2015   Right sided weakness 05/07/2015   Syncope 05/07/2015   Polysubstance abuse (HCC) 05/07/2015   Rupture of globe 09/16/2014    PCP: Grayce Sessions, NP  REFERRING PROVIDER: Kirtland Bouchard, PA-C  REFERRING DIAG: M54.2 (ICD-10-CM) - Cervicalgia   THERAPY DIAG:  No diagnosis found.  Rationale for Evaluation and Treatment: Rehabilitation  ONSET DATE: Chronic  SUBJECTIVE:  SUBJECTIVE STATEMENT: ***  EVAL: Pt presents to PT with reports of chronic L sided neck pain. Denies bowel/bladder changes or saddle anesthesia. Has occasional N/T down L UE to hand. Has difficulty with turning to L and with reaching OH with L UE.   Hand dominance: Right  PERTINENT HISTORY:  See PMH  PAIN:  Are you having pain?  Yes: NPRS scale: 6/10 Worst: 10/10 Pain location: L cervical, L upper trap Pain description: sharp, tight Aggravating factors: reaching overhead, turning head L Relieving factors: heat, medication  PRECAUTIONS: None  RED FLAGS: None    WEIGHT BEARING RESTRICTIONS: No  FALLS:  Has patient fallen in last 6 months? No  LIVING ENVIRONMENT: Lives with: lives with their  family Lives in: House/apartment Stairs: No Has following equipment at home: Single point cane  OCCUPATION: Not currently working  PLOF: Independent  PATIENT GOALS: decrease neck pain, be able to walk and dress better without pain  NEXT MD VISIT: 02/16/2023  OBJECTIVE:  Note: Objective measures were completed at Evaluation unless otherwise noted.  DIAGNOSTIC FINDINGS:  See imaging  PATIENT SURVEYS:  FOTO: 47% function; 58% predicted  COGNITION: Overall cognitive status: Within functional limits for tasks assessed  SENSATION: Light touch: Impaired - L UE  POSTURE: rounded shoulders and forward head  PALPATION: TTP L upper trap   CERVICAL ROM:   Active ROM A/PROM (deg) eval  Flexion   Extension   Right lateral flexion   Left lateral flexion   Right rotation 45  Left rotation 29   (Blank rows = not tested)  UPPER EXTREMITY ROM:  Active ROM Right eval Left eval  Shoulder flexion WFL 110  Shoulder extension    Shoulder abduction    Shoulder adduction    Shoulder extension    Shoulder internal rotation    Shoulder external rotation    Elbow flexion    Elbow extension    Wrist flexion    Wrist extension    Wrist ulnar deviation    Wrist radial deviation    Wrist pronation    Wrist supination     (Blank rows = not tested)  UPPER EXTREMITY MMT:  MMT Right eval Left eval  Shoulder flexion 3/5 3/5  Shoulder extension    Shoulder abduction    Shoulder adduction    Shoulder extension    Shoulder internal rotation    Shoulder external rotation    Middle trapezius 3/5 3/5  Lower trapezius 3/5 3/5  Elbow flexion    Elbow extension    Wrist flexion    Wrist extension    Wrist ulnar deviation    Wrist radial deviation    Wrist pronation    Wrist supination    Grip strength     (Blank rows = not tested)  CERVICAL SPECIAL TESTS:  DNT  FUNCTIONAL TESTS:  Cervical Flexor Endurance Test: 10 seconds  TREATMENT: [facility] Adult PT Treatment:                                                 DATE: 11/07/2023 Therapeutic Exercise: L upper trap stretch x 30" Row x 10 RTB Supine chin tuck x 5  [facility] Adult PT Treatment:  DATE: 09/24/2023 Therapeutic Exercise: L upper trap stretch x 30" Row x 10 RTB Supine chin tuck x 5  PATIENT EDUCATION:  Education details: eval findings, FOTO, HEP, POC Person educated: Patient Education method: Explanation, Demonstration, and Handouts Education comprehension: verbalized understanding and returned demonstration  HOME EXERCISE PROGRAM: Access Code: [email or web address] URL: [email or web address] Date: 09/24/2023 Prepared by: Edwinna Areola  Exercises - Seated Upper Trapezius Stretch (Mirrored)  - 1 x daily - 7 x weekly - 2 reps - 20 sec hold - Standing Shoulder Row with Anchored Resistance  - 1 x daily - 7 x weekly - 3 sets - 10 reps - Supine Chin Tuck  - 1 x daily - 7 x weekly - 2 sets - 10 reps - 5 sec hold  ASSESSMENT:  CLINICAL IMPRESSION: ***  EVAL: Patient is a 63 y.o. F who was seen today for physical therapy evaluation and treatment for chronic L sided neck pain. Physical findings are consistent with referring provider impression as pt demonstrates decrease in cervical ROM and postural muscle weakness. Her FOTO score shows she is operating below PLOF for subjective functional ability. She would benefit from skilled PT services working on improving postural muscle strength and cervical ROM.    OBJECTIVE IMPAIRMENTS: decreased activity tolerance, decreased balance, decreased endurance, decreased mobility, difficulty walking, decreased ROM, decreased strength, impaired UE functional use, improper body mechanics, postural dysfunction, and pain  ACTIVITY LIMITATIONS: carrying, lifting, dressing, reach over head, and hygiene/grooming  PARTICIPATION LIMITATIONS: meal prep, cleaning, driving, shopping, community activity, occupation, and yard  work  PERSONAL FACTORS: Fitness and Time since onset of injury/illness/exacerbation are also affecting patient's functional outcome.   REHAB POTENTIAL: Good  CLINICAL DECISION MAKING: Stable/uncomplicated  EVALUATION COMPLEXITY: Low   GOALS: Goals reviewed with patient? No  SHORT TERM GOALS: Target date: 10/15/2023   Pt will be compliant and knowledgeable with initial HEP for improved comfort and carryover Baseline: initial HEP given  Goal status: INITIAL  2.  Pt will self report left sided neck pain no greater than 7/10 for improved comfort and functional ability Baseline: 10/10 at worst Goal status: INITIAL   LONG TERM GOALS: Target date: 11/19/2023   Pt will improve FOTO function score to no less than 58% as proxy for functional improvement Baseline: 47% function Goal status: INITIAL   2.  Pt will self report L sided neck pain no greater than 3/10 for improved comfort and functional ability Baseline: 10/10 at worst Goal status: INITIAL   3.  Pt will improve bilateral cervical rotation ROM to no less than 60 degrees for improved functional ability with dressing, driving and other ADLs Baseline: see ROM chart Goal status: INITIAL  4.  Pt will improve L shoulder flexion to no less than 140 degrees for improved functional ability and ADL performance Baseline:  Goal status: INITIAL   PLAN:  PT FREQUENCY: 1x/week  PT DURATION: 8 weeks  PLANNED INTERVENTIONS: 97164- PT Re-evaluation, 97110-Therapeutic exercises, 97530- Therapeutic activity, 97112- Neuromuscular re-education, 97535- Self Care, 69629- Manual therapy, 97014- Electrical stimulation (unattended), [id number]- Electrical stimulation (manual), Dry Needling, Cryotherapy, and Moist heat  PLAN FOR NEXT SESSION: assess HEP response, periscapular and DNF strengthening, manual to L upper trap  For all possible CPT codes, reference the Planned Interventions line above.     Check all conditions that are expected to impact  treatment: {Conditions expected to impact treatment:Musculoskeletal disorders   If treatment provided at initial evaluation, no treatment charged due to lack of authorization.  Eloy End, PT 11/07/2023, 8:22 AM

## 2023-11-26 ENCOUNTER — Other Ambulatory Visit: Payer: Self-pay

## 2023-11-26 ENCOUNTER — Other Ambulatory Visit (INDEPENDENT_AMBULATORY_CARE_PROVIDER_SITE_OTHER): Payer: Self-pay | Admitting: Primary Care

## 2023-11-26 DIAGNOSIS — E782 Mixed hyperlipidemia: Secondary | ICD-10-CM

## 2023-11-26 MED ORDER — ATORVASTATIN CALCIUM 40 MG PO TABS
40.0000 mg | ORAL_TABLET | Freq: Every day | ORAL | 0 refills | Status: DC
Start: 2023-11-26 — End: 2024-04-13
  Filled 2023-11-26: qty 90, 90d supply, fill #0

## 2023-11-26 NOTE — Telephone Encounter (Signed)
Requested by interface surescripts. Future visit in 2 months.  Requested Prescriptions  Pending Prescriptions Disp Refills   atorvastatin (LIPITOR) 40 MG tablet 90 tablet 0    Sig: Take 1 tablet (40 mg total) by mouth daily.     Cardiovascular:  Antilipid - Statins Failed - 11/26/2023  2:50 PM      Failed - Lipid Panel in normal range within the last 12 months    Cholesterol, Total  Date Value Ref Range Status  02/12/2023 155 100 - 199 mg/dL Final   LDL Chol Calc (NIH)  Date Value Ref Range Status  02/12/2023 57 0 - 99 mg/dL Final   HDL  Date Value Ref Range Status  02/12/2023 83 >39 mg/dL Final   Triglycerides  Date Value Ref Range Status  02/12/2023 79 0 - 149 mg/dL Final         Passed - Patient is not pregnant      Passed - Valid encounter within last 12 months    Recent Outpatient Visits           3 months ago Chronic pain of both shoulders   [facility] Grayce Sessions, NP   4 months ago Encounter for immunization   [place] [facility] Grayce Sessions, NP   9 months ago Lipoma of other specified sites   [facility] Hoy Register, MD   1 year ago Colon cancer screening   [place] [facility] Grayce Sessions, NP   1 year ago Hypokalemia   [place] [facility] Grayce Sessions, NP       Future Appointments             In 2 months Randa Evens, Kinnie Scales, NP [facility]

## 2023-11-27 ENCOUNTER — Other Ambulatory Visit: Payer: Self-pay

## 2024-01-13 ENCOUNTER — Encounter: Payer: Self-pay | Admitting: *Deleted

## 2024-02-11 ENCOUNTER — Telehealth (INDEPENDENT_AMBULATORY_CARE_PROVIDER_SITE_OTHER): Payer: Self-pay | Admitting: Primary Care

## 2024-02-11 NOTE — Telephone Encounter (Signed)
 Pt was scheduled for town different appts, one day apart. Pt wanted to keep PHY appt and cancel the other. Pt was reminded about appt.

## 2024-02-16 ENCOUNTER — Ambulatory Visit (INDEPENDENT_AMBULATORY_CARE_PROVIDER_SITE_OTHER): Payer: MEDICAID | Admitting: Primary Care

## 2024-02-19 ENCOUNTER — Telehealth (INDEPENDENT_AMBULATORY_CARE_PROVIDER_SITE_OTHER): Payer: Self-pay | Admitting: Primary Care

## 2024-02-19 ENCOUNTER — Ambulatory Visit (INDEPENDENT_AMBULATORY_CARE_PROVIDER_SITE_OTHER): Payer: MEDICAID | Admitting: Primary Care

## 2024-02-19 NOTE — Telephone Encounter (Signed)
 Called pt to confirm appt. Pt will be present.

## 2024-02-20 ENCOUNTER — Encounter (INDEPENDENT_AMBULATORY_CARE_PROVIDER_SITE_OTHER): Payer: MEDICAID | Admitting: Primary Care

## 2024-02-20 ENCOUNTER — Encounter (INDEPENDENT_AMBULATORY_CARE_PROVIDER_SITE_OTHER): Payer: Self-pay

## 2024-02-25 ENCOUNTER — Other Ambulatory Visit (INDEPENDENT_AMBULATORY_CARE_PROVIDER_SITE_OTHER): Payer: Self-pay | Admitting: Primary Care

## 2024-02-25 ENCOUNTER — Other Ambulatory Visit: Payer: Self-pay

## 2024-02-25 DIAGNOSIS — Z760 Encounter for issue of repeat prescription: Secondary | ICD-10-CM

## 2024-02-25 DIAGNOSIS — E782 Mixed hyperlipidemia: Secondary | ICD-10-CM

## 2024-02-25 NOTE — Telephone Encounter (Unsigned)
 Copied from CRM [id number]. Topic: Clinical - Medication Refill >> Feb 25, 2024  1:07 PM Lysbeth Sauger wrote: Medication: risperidone   Has the patient contacted their pharmacy? No (Agent: If no, request that the patient contact the pharmacy for the refill. If patient does not wish to contact the pharmacy document the reason why and proceed with request.) (Agent: If yes, when and what did the pharmacy advise?)  This is the patient's preferred pharmacy:  [facility] [address], [address] [place] [address] Phone: [phone] Fax: [phone]  Is this the correct pharmacy for this prescription? Yes If no, delete pharmacy and type the correct one.   Has the prescription been filled recently? No  Is the patient out of the medication? Yes  Has the patient been seen for an appointment in the last year OR does the patient have an upcoming appointment? Yes nov 18th  Can we respond through MyChart? No  Agent: Please be advised that Rx refills may take up to 3 business days. We ask that you follow-up with your pharmacy.

## 2024-02-26 NOTE — Telephone Encounter (Signed)
 Requested medications are due for refill today.  yes  Requested medications are on the active medications list.  yes  Last refill. Varied.  Future visit scheduled.   no  Notes to clinic.  Labs expired for statin. Risperdal  is not delegated.    Requested Prescriptions  Pending Prescriptions Disp Refills   risperiDONE  (RISPERDAL ) 1 MG tablet 90 tablet 1    Sig: Take 1 tablet (1 mg total) by mouth at bedtime. Needs appointment for futher refills.     Not Delegated - Psychiatry:  Antipsychotics - Second Generation (Atypical) - risperidone  Failed - 02/26/2024  3:57 PM      Failed - This refill cannot be delegated      Failed - TSH in normal range and within 360 days    TSH  Date Value Ref Range Status  06/06/2021 3.002 0.350 - 4.500 uIU/mL Final    Comment:    Performed by a 3rd Generation assay with a functional sensitivity of <=0.01 uIU/mL. Performed at [facility], [address], [place], [place] [address]   03/19/2021 1.180 0.450 - 4.500 uIU/mL Final         Failed - Valid encounter within last 6 months    Recent Outpatient Visits           6 months ago Chronic pain of both shoulders   [place] [facility] Marius Siemens, NP   7 months ago Encounter for immunization   [place] [facility] Marius Siemens, NP   1 year ago Lipoma of other specified sites   [facility] - [facility] Dept Of [place] Joaquin Mulberry, MD   1 year ago Colon cancer screening   [place] [facility] Marius Siemens, NP   2 years ago Hypokalemia   [place] [facility] Marius Siemens, NP              Failed - Lipid Panel in normal range within the last 12 months    Cholesterol, Total  Date Value Ref Range Status  02/12/2023 155 100 - 199 mg/dL Final   LDL Chol Calc (NIH)  Date Value Ref Range Status  02/12/2023 57 0 - 99 mg/dL Final   HDL   Date Value Ref Range Status  02/12/2023 83 >39 mg/dL Final   Triglycerides  Date Value Ref Range Status  02/12/2023 79 0 - 149 mg/dL Final         Failed - CBC within normal limits and completed in the last 12 months    WBC  Date Value Ref Range Status  02/12/2023 7.1 3.4 - 10.8 x10E3/uL Final  05/25/2022 9.2 4.0 - 10.5 K/uL Final   RBC  Date Value Ref Range Status  02/12/2023 4.38 3.77 - 5.28 x10E6/uL Final  05/25/2022 4.18 3.87 - 5.11 MIL/uL Final   Hemoglobin  Date Value Ref Range Status  02/12/2023 14.5 11.1 - 15.9 g/dL Final   Hematocrit  Date Value Ref Range Status  02/12/2023 40.8 34.0 - 46.6 % Final   MCHC  Date Value Ref Range Status  02/12/2023 35.5 31.5 - 35.7 g/dL Final  60/45/4098 11.9 (H) 30.0 - 36.0 g/dL Final   [facility]  Date Value Ref Range Status  02/12/2023 33.1 (H) 26.6 - 33.0 pg Final  05/25/2022 33.0 26.0 - 34.0 pg Final   MCV  Date Value Ref Range Status  02/12/2023 93 79 - 97 fL Final  No results found for: "PLTCOUNTKUC", "LABPLAT", "POCPLA" RDW  Date Value Ref Range Status  02/12/2023 13.1 11.7 - 15.4 % Final         Failed - CMP within normal limits and completed in the last 12 months    Albumin  Date Value Ref Range Status  02/12/2023 4.4 3.8 - 4.9 g/dL Final   Alkaline Phosphatase  Date Value Ref Range Status  02/12/2023 100 44 - 121 IU/L Final   ALT  Date Value Ref Range Status  02/12/2023 26 0 - 32 IU/L Final   AST  Date Value Ref Range Status  02/12/2023 40 0 - 40 IU/L Final   BUN  Date Value Ref Range Status  02/12/2023 5 (L) 6 - 24 mg/dL Final   Calcium   Date Value Ref Range Status  02/12/2023 9.9 8.7 - 10.2 mg/dL Final   Calcium , Ion  Date Value Ref Range Status  05/07/2015 1.19 1.12 - 1.23 mmol/L Final   CO2  Date Value Ref Range Status  02/12/2023 19 (L) 20 - 29 mmol/L Final   TCO2  Date Value Ref Range Status  05/07/2015 19 0 - 100 mmol/L Final   Creatinine, Ser  Date Value Ref Range Status   02/12/2023 0.62 0.57 - 1.00 mg/dL Final   Glucose  Date Value Ref Range Status  02/12/2023 79 70 - 99 mg/dL Final   Glucose, Bld  Date Value Ref Range Status  05/25/2022 105 (H) 70 - 99 mg/dL Final    Comment:    Glucose reference range applies only to samples taken after fasting for at least 8 hours.   Glucose-Capillary  Date Value Ref Range Status  06/08/2021 138 (H) 70 - 99 mg/dL Final    Comment:    Glucose reference range applies only to samples taken after fasting for at least 8 hours.   Potassium  Date Value Ref Range Status  02/12/2023 4.7 3.5 - 5.2 mmol/L Final   Sodium  Date Value Ref Range Status  02/12/2023 141 134 - 144 mmol/L Final   Bilirubin Total  Date Value Ref Range Status  02/12/2023 0.5 0.0 - 1.2 mg/dL Final   Bilirubin, Direct  Date Value Ref Range Status  05/25/2022 0.2 0.0 - 0.2 mg/dL Final   Indirect Bilirubin  Date Value Ref Range Status  05/25/2022 0.6 0.3 - 0.9 mg/dL Final    Comment:    Performed at [facility], [address] [place], [place] [address]   Protein, ur  Date Value Ref Range Status  05/25/2022 NEGATIVE NEGATIVE mg/dL Final   Total Protein  Date Value Ref Range Status  02/12/2023 7.8 6.0 - 8.5 g/dL Final   GFR calc Af Amer  Date Value Ref Range Status  05/03/2020 >60 >60 mL/min Final   eGFR  Date Value Ref Range Status  02/12/2023 103 >59 mL/min/1.73 Final   GFR, Estimated  Date Value Ref Range Status  05/25/2022 >60 >60 mL/min Final    Comment:    (NOTE) Calculated using the CKD-EPI Creatinine Equation (2021)          Passed - Completed PHQ-2 or PHQ-9 in the last 360 days      Passed - Last BP in normal range    BP Readings from Last 1 Encounters:  08/19/23 131/87         Passed - Last Heart Rate in normal range    Pulse Readings from Last 1 Encounters:  08/19/23 71  atorvastatin  (LIPITOR) 40 MG tablet 90 tablet 0    Sig: Take 1 tablet (40 mg total) by mouth daily.      Cardiovascular:  Antilipid - Statins Failed - 02/26/2024  3:57 PM      Failed - Lipid Panel in normal range within the last 12 months    Cholesterol, Total  Date Value Ref Range Status  02/12/2023 155 100 - 199 mg/dL Final   LDL Chol Calc (NIH)  Date Value Ref Range Status  02/12/2023 57 0 - 99 mg/dL Final   HDL  Date Value Ref Range Status  02/12/2023 83 >39 mg/dL Final   Triglycerides  Date Value Ref Range Status  02/12/2023 79 0 - 149 mg/dL Final         Passed - Patient is not pregnant      Passed - Valid encounter within last 12 months    Recent Outpatient Visits           6 months ago Chronic pain of both shoulders   [place] [facility] Marius Siemens, NP   7 months ago Encounter for immunization   [place] [facility] Marius Siemens, NP   1 year ago Lipoma of other specified sites   [facility] Dept Of [place]. [facility] Joaquin Mulberry, MD   1 year ago Colon cancer screening   [place] [facility] Marius Siemens, NP   2 years ago Hypokalemia   [place] [facility] Marius Siemens, NP

## 2024-03-02 ENCOUNTER — Other Ambulatory Visit: Payer: Self-pay

## 2024-03-04 ENCOUNTER — Other Ambulatory Visit: Payer: Self-pay

## 2024-03-04 ENCOUNTER — Telehealth (INDEPENDENT_AMBULATORY_CARE_PROVIDER_SITE_OTHER): Payer: Self-pay | Admitting: Primary Care

## 2024-03-04 ENCOUNTER — Other Ambulatory Visit (INDEPENDENT_AMBULATORY_CARE_PROVIDER_SITE_OTHER): Payer: Self-pay | Admitting: Primary Care

## 2024-03-04 ENCOUNTER — Telehealth: Payer: Self-pay

## 2024-03-04 DIAGNOSIS — Z760 Encounter for issue of repeat prescription: Secondary | ICD-10-CM

## 2024-03-04 MED ORDER — RISPERIDONE 1 MG PO TABS
1.0000 mg | ORAL_TABLET | Freq: Every day | ORAL | 0 refills | Status: DC
  Filled 2024-03-04: qty 30, 30d supply, fill #0

## 2024-03-04 NOTE — Telephone Encounter (Signed)
 Will forward to provider

## 2024-03-04 NOTE — Telephone Encounter (Signed)
 Called pt to schedule a appt. Pt was able to schedule with me at this time.

## 2024-03-04 NOTE — Telephone Encounter (Signed)
 Routing to office

## 2024-03-04 NOTE — Telephone Encounter (Signed)
 Copied from CRM [id number]. Topic: Clinical - Medication Question >> Mar 04, 2024  1:50 PM Adonis Hoot wrote: Reason for CRM: Patient called in stating that she came to her physical appointment  on 02/20/2024 and patients were taken in front of her so she left.She would like to know if she could have her medication  risperiDONE  (RISPERDAL ) 1 MG tablet Filled? She said that she is out of the medication.

## 2024-03-09 ENCOUNTER — Other Ambulatory Visit: Payer: Self-pay

## 2024-03-10 ENCOUNTER — Telehealth (INDEPENDENT_AMBULATORY_CARE_PROVIDER_SITE_OTHER): Payer: Self-pay | Admitting: Primary Care

## 2024-03-10 NOTE — Telephone Encounter (Signed)
 Called pt to confirm appt. Pt did not answer and LVM

## 2024-03-11 ENCOUNTER — Ambulatory Visit (INDEPENDENT_AMBULATORY_CARE_PROVIDER_SITE_OTHER): Payer: MEDICAID | Admitting: Primary Care

## 2024-03-16 ENCOUNTER — Encounter (INDEPENDENT_AMBULATORY_CARE_PROVIDER_SITE_OTHER): Payer: Self-pay | Admitting: Primary Care

## 2024-03-16 ENCOUNTER — Ambulatory Visit (INDEPENDENT_AMBULATORY_CARE_PROVIDER_SITE_OTHER): Payer: MEDICAID | Admitting: Primary Care

## 2024-03-16 VITALS — BP 156/96 | HR 74 | Wt 175.8 lb

## 2024-03-16 DIAGNOSIS — M79602 Pain in left arm: Secondary | ICD-10-CM | POA: Diagnosis not present

## 2024-03-16 DIAGNOSIS — R030 Elevated blood-pressure reading, without diagnosis of hypertension: Secondary | ICD-10-CM

## 2024-03-16 NOTE — Progress Notes (Signed)
 [facility]  Janet Ramos, is a 63 y.o. female  MWN:[id number]  QIH:[id number]  DOB - 1961-08-09  Chief Complaint  Patient presents with   Annual Exam       Subjective:   Janet Ramos is a 63 y.o. female here today for an acute visit.  Patient is here today because of left shoulder continuing to be painful and limiting her activity and use of left arm.  She stated that she was supposed to have a MRI but it was never done because she was claustrophobic.  Patient reached out to writer PCP and was explaining orthopedics would have to prescribe something.  Patient call left a message and no one ever returned her call.  Reviewed chart and noticed the same day she called [facility] Lathe, PA a sent in Valium  5 mg take 1 tablet prior to MRI and 1 if needed.  Until now patient was not aware that he had medication and was in this pain for this long and unaware. Blood pressure is elevated today she does not take medication for BP and will not take.  She mentions that she was in pain and had a disagreement prior to this appointment.  No problems updated.  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 90 tablet 0   risperiDONE  (RISPERDAL ) 1 MG tablet Take 1 tablet (1 mg total) by mouth at bedtime. Needs appointment for futher refills. 30 tablet 0   diazepam  (VALIUM ) 5 MG tablet TAKE ONE TAB ONE HOUR PRIOR TO MRI REPEAT AS NEEDED (Patient not taking: Reported on 03/16/2024) 2 tablet 0   ibuprofen  (ADVIL ) 800 MG tablet Take 1 tablet (800 mg total) by mouth every 8 (eight) hours as needed. (Patient not taking: Reported on 03/16/2024) 90 tablet 0   methylPREDNISolone  (MEDROL ) 4 MG tablet Take as directed (Patient not taking: Reported on 03/16/2024) 21 tablet 0   No current facility-administered medications on file prior to visit.   Health Maintenance  Topic Date Due   Pneumococcal Vaccination (1 of 2 - PCV) Never done   Cologuard (Stool DNA test)  Never done   Zoster (Shingles) Vaccine (1 of 2) Never done   Screening for Lung Cancer  06/06/2022   COVID-19 Vaccine (3 - 2024-25 season) 06/15/2023   Mammogram  01/19/2024   Flu Shot  05/14/2024   DTaP/Tdap/Td vaccine (  2 - Td or Tdap) 09/16/2024   Pap with HPV screening  09/14/2025   Hepatitis C Screening  Completed   HIV Screening  Completed   HPV Vaccine  Aged Out   Meningitis B Vaccine  Aged Out    Objective:   Vitals:   03/16/24 1507  BP: (!) 160/103  Pulse: 74  SpO2: 100%  Weight: 175 lb 12.8 oz (79.7 kg)   BP Readings from Last 3 Encounters:  03/16/24 (!) 160/103  08/19/23 131/87  07/02/23 121/84      Physical Exam Vitals reviewed.  Constitutional:      Appearance: Normal appearance. She is obese.  HENT:     Head: Normocephalic.     Right Ear: Tympanic membrane, ear canal and external ear normal.     Left Ear: Tympanic membrane, ear canal and external ear normal.     Nose: Nose normal.     Mouth/Throat:     Mouth: Mucous membranes are moist.  Eyes:      Extraocular Movements: Extraocular movements intact.     Pupils: Pupils are equal, round, and reactive to light.  Cardiovascular:     Rate and Rhythm: Normal rate.  Pulmonary:     Effort: Pulmonary effort is normal.     Breath sounds: Normal breath sounds.  Abdominal:     General: Bowel sounds are normal.     Palpations: Abdomen is soft.  Musculoskeletal:        General: Tenderness present.     Cervical back: Normal range of motion.  Skin:    General: Skin is warm and dry.  Neurological:     Mental Status: She is alert and oriented to person, place, and time.  Psychiatric:        Mood and Affect: Mood normal.        Behavior: Behavior normal.        Thought Content: Thought content normal.     Assessment & Plan  Khrystyne was seen today for annual exam.  Diagnoses and all orders for this visit:  Left arm pain See HPI  Elevated blood pressure reading We have discussed target BP range and blood pressure goal. 140/90. We discussed the importance of compliance with medical therapy and DASH diet recommended, consequences of uncontrolled hypertension discussed.  - continue current BP medications      Patient have been counseled extensively about nutrition and exercise. Other issues discussed during this visit include: low cholesterol diet, weight control and daily exercise, foot care, annual eye examinations at Ophthalmology, importance of adherence with medications and regular follow-up. We also discussed long term complications of uncontrolled diabetes and hypertension.   No follow-ups on file.  The patient was given clear instructions to go to ER or return to medical center if symptoms don't improve, worsen or new problems develop. The patient verbalized understanding. The patient was told to call to get lab results if they haven't heard anything in the next week.   This note has been created with Education officer, environmental. Any transcriptional  errors are unintentional.   Marius Siemens, NP 03/16/2024, 3:44 PM

## 2024-03-16 NOTE — Patient Instructions (Signed)
 Hypertension, Adult Hypertension is another name for high blood pressure. High blood pressure forces your heart to work harder to pump blood. This can cause problems over time. There are two numbers in a blood pressure reading. There is a top number (systolic) over a bottom number (diastolic). It is best to have a blood pressure that is below 120/80. What are the causes? The cause of this condition is not known. Some other conditions can lead to high blood pressure. What increases the risk? Some lifestyle factors can make you more likely to develop high blood pressure: Smoking. Not getting enough exercise or physical activity. Being overweight. Having too much fat, sugar, calories, or salt (sodium) in your diet. Drinking too much alcohol. Other risk factors include: Having any of these conditions: Heart disease. Diabetes. High cholesterol. Kidney disease. Obstructive sleep apnea. Having a family history of high blood pressure and high cholesterol. Age. The risk increases with age. Stress. What are the signs or symptoms? High blood pressure may not cause symptoms. Very high blood pressure (hypertensive crisis) may cause: Headache. Fast or uneven heartbeats (palpitations). Shortness of breath. Nosebleed. Vomiting or feeling like you may vomit (nauseous). Changes in how you see. Very bad chest pain. Feeling dizzy. Seizures. How is this treated? This condition is treated by making healthy lifestyle changes, such as: Eating healthy foods. Exercising more. Drinking less alcohol. Your doctor may prescribe medicine if lifestyle changes do not help enough and if: Your top number is above 130. Your bottom number is above 80. Your personal target blood pressure may vary. Follow these instructions at home: Eating and drinking  If told, follow the DASH eating plan. To follow this plan: Fill one half of your plate at each meal with fruits and vegetables. Fill one fourth of your plate  at each meal with whole grains. Whole grains include whole-wheat pasta, brown rice, and whole-grain bread. Eat or drink low-fat dairy products, such as skim milk or low-fat yogurt. Fill one fourth of your plate at each meal with low-fat (lean) proteins. Low-fat proteins include fish, chicken without skin, eggs, beans, and tofu. Avoid fatty meat, cured and processed meat, or chicken with skin. Avoid pre-made or processed food. Limit the amount of salt in your diet to less than 1,500 mg each day. Do not drink alcohol if: Your doctor tells you not to drink. You are pregnant, may be pregnant, or are planning to become pregnant. If you drink alcohol: Limit how much you have to: 0-1 drink a day for women. 0-2 drinks a day for men. Know how much alcohol is in your drink. In the [place] one drink equals one 12 oz bottle of beer (355 mL), one 5 oz glass of wine (148 mL), or one 1 oz glass of hard liquor (44 mL). Lifestyle  Work with your doctor to stay at a healthy weight or to lose weight. Ask your doctor what the best weight is for you. Get at least 30 minutes of exercise that causes your heart to beat faster (aerobic exercise) most days of the week. This may include walking, swimming, or biking. Get at least 30 minutes of exercise that strengthens your muscles (resistance exercise) at least 3 days a week. This may include lifting weights or doing Pilates. Do not smoke or use any products that contain nicotine or tobacco. If you need help quitting, ask your doctor. Check your blood pressure at home as told by your doctor. Keep all follow-up visits. Medicines Take over-the-counter and prescription medicines  only as told by your doctor. Follow directions carefully. Do not skip doses of blood pressure medicine. The medicine does not work as well if you skip doses. Skipping doses also puts you at risk for problems. Ask your doctor about side effects or reactions to medicines that you should watch  for. Contact a doctor if: You think you are having a reaction to the medicine you are taking. You have headaches that keep coming back. You feel dizzy. You have swelling in your ankles. You have trouble with your vision. Get help right away if: You get a very bad headache. You start to feel mixed up (confused). You feel weak or numb. You feel faint. You have very bad pain in your: Chest. Belly (abdomen). You vomit more than once. You have trouble breathing. These symptoms may be an emergency. Get help right away. Call 911. Do not wait to see if the symptoms will go away. Do not drive yourself to the hospital. Summary Hypertension is another name for high blood pressure. High blood pressure forces your heart to work harder to pump blood. For most people, a normal blood pressure is less than 120/80. Making healthy choices can help lower blood pressure. If your blood pressure does not get lower with healthy choices, you may need to take medicine. This information is not intended to replace advice given to you by your health care provider. Make sure you discuss any questions you have with your health care provider. Document Revised: 07/19/2021 Document Reviewed: 07/19/2021 Elsevier Patient Education  2024 [employer].

## 2024-03-17 ENCOUNTER — Other Ambulatory Visit: Payer: Self-pay

## 2024-03-17 ENCOUNTER — Encounter: Payer: Self-pay | Admitting: Physician Assistant

## 2024-03-18 ENCOUNTER — Other Ambulatory Visit: Payer: Self-pay

## 2024-03-26 ENCOUNTER — Inpatient Hospital Stay: Admission: RE | Admit: 2024-03-26 | Payer: MEDICAID | Source: Ambulatory Visit

## 2024-03-29 ENCOUNTER — Telehealth (INDEPENDENT_AMBULATORY_CARE_PROVIDER_SITE_OTHER): Payer: Self-pay | Admitting: Primary Care

## 2024-03-29 NOTE — Telephone Encounter (Signed)
 Left VM with pt about their upcoming appt.

## 2024-03-30 ENCOUNTER — Other Ambulatory Visit (INDEPENDENT_AMBULATORY_CARE_PROVIDER_SITE_OTHER): Payer: MEDICAID

## 2024-03-30 DIAGNOSIS — E782 Mixed hyperlipidemia: Secondary | ICD-10-CM

## 2024-03-30 DIAGNOSIS — Z0000 Encounter for general adult medical examination without abnormal findings: Secondary | ICD-10-CM

## 2024-03-31 ENCOUNTER — Ambulatory Visit (INDEPENDENT_AMBULATORY_CARE_PROVIDER_SITE_OTHER): Payer: Self-pay | Admitting: Primary Care

## 2024-03-31 LAB — CBC WITH DIFFERENTIAL/PLATELET
Basophils Absolute: 0 10*3/uL (ref 0.0–0.2)
Basos: 1 %
EOS (ABSOLUTE): 0.1 10*3/uL (ref 0.0–0.4)
Eos: 2 %
Hematocrit: 38.8 % (ref 34.0–46.6)
Hemoglobin: 13 g/dL (ref 11.1–15.9)
Immature Grans (Abs): 0 10*3/uL (ref 0.0–0.1)
Immature Granulocytes: 0 %
Lymphocytes Absolute: 2.6 10*3/uL (ref 0.7–3.1)
Lymphs: 39 %
MCH: 33 pg (ref 26.6–33.0)
MCHC: 33.5 g/dL (ref 31.5–35.7)
MCV: 99 fL — ABNORMAL HIGH (ref 79–97)
Monocytes Absolute: 0.7 10*3/uL (ref 0.1–0.9)
Monocytes: 10 %
Neutrophils Absolute: 3.2 10*3/uL (ref 1.4–7.0)
Neutrophils: 48 %
Platelets: 131 10*3/uL — ABNORMAL LOW (ref 150–450)
RBC: 3.94 x10E6/uL (ref 3.77–5.28)
RDW: 13.6 % (ref 11.7–15.4)
WBC: 6.6 10*3/uL (ref 3.4–10.8)

## 2024-03-31 LAB — CMP14+EGFR
ALT: 15 IU/L (ref 0–32)
AST: 29 IU/L (ref 0–40)
Albumin: 4.2 g/dL (ref 3.9–4.9)
Alkaline Phosphatase: 82 IU/L (ref 44–121)
BUN/Creatinine Ratio: 8 — ABNORMAL LOW (ref 12–28)
BUN: 5 mg/dL — ABNORMAL LOW (ref 8–27)
Bilirubin Total: 0.6 mg/dL (ref 0.0–1.2)
CO2: 21 mmol/L (ref 20–29)
Calcium: 9.5 mg/dL (ref 8.7–10.3)
Chloride: 103 mmol/L (ref 96–106)
Creatinine, Ser: 0.62 mg/dL (ref 0.57–1.00)
Globulin, Total: 3.1 g/dL (ref 1.5–4.5)
Glucose: 88 mg/dL (ref 70–99)
Potassium: 4 mmol/L (ref 3.5–5.2)
Sodium: 138 mmol/L (ref 134–144)
Total Protein: 7.3 g/dL (ref 6.0–8.5)
eGFR: 101 mL/min/{1.73_m2} (ref >59)

## 2024-03-31 LAB — LIPID PANEL
Chol/HDL Ratio: 1.9 ratio (ref 0.0–4.4)
Cholesterol, Total: 164 mg/dL (ref 100–199)
HDL: 88 mg/dL (ref >39)
LDL Chol Calc (NIH): 62 mg/dL (ref 0–99)
Triglycerides: 74 mg/dL (ref 0–149)
VLDL Cholesterol Cal: 14 mg/dL (ref 5–40)
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
## 2024-04-06 ENCOUNTER — Telehealth (INDEPENDENT_AMBULATORY_CARE_PROVIDER_SITE_OTHER): Payer: Self-pay | Admitting: Primary Care

## 2024-04-06 NOTE — Telephone Encounter (Signed)
 Left VM with pt about their upcoming appt.

## 2024-04-12 ENCOUNTER — Other Ambulatory Visit: Payer: Self-pay

## 2024-04-12 ENCOUNTER — Other Ambulatory Visit (INDEPENDENT_AMBULATORY_CARE_PROVIDER_SITE_OTHER): Payer: Self-pay | Admitting: Primary Care

## 2024-04-12 ENCOUNTER — Telehealth (INDEPENDENT_AMBULATORY_CARE_PROVIDER_SITE_OTHER): Payer: Self-pay | Admitting: Primary Care

## 2024-04-12 DIAGNOSIS — Z760 Encounter for issue of repeat prescription: Secondary | ICD-10-CM

## 2024-04-12 NOTE — Telephone Encounter (Signed)
 Left VM with pt about their upcoming appt.

## 2024-04-13 ENCOUNTER — Ambulatory Visit (INDEPENDENT_AMBULATORY_CARE_PROVIDER_SITE_OTHER): Payer: MEDICAID | Admitting: Primary Care

## 2024-04-13 ENCOUNTER — Encounter (INDEPENDENT_AMBULATORY_CARE_PROVIDER_SITE_OTHER): Payer: Self-pay | Admitting: Primary Care

## 2024-04-13 VITALS — BP 130/88 | Resp 16 | Wt 176.2 lb

## 2024-04-13 DIAGNOSIS — Z760 Encounter for issue of repeat prescription: Secondary | ICD-10-CM

## 2024-04-13 DIAGNOSIS — E782 Mixed hyperlipidemia: Secondary | ICD-10-CM

## 2024-04-13 DIAGNOSIS — Z720 Tobacco use: Secondary | ICD-10-CM

## 2024-04-13 DIAGNOSIS — Z1211 Encounter for screening for malignant neoplasm of colon: Secondary | ICD-10-CM

## 2024-04-13 DIAGNOSIS — Z1231 Encounter for screening mammogram for malignant neoplasm of breast: Secondary | ICD-10-CM

## 2024-04-14 ENCOUNTER — Other Ambulatory Visit (INDEPENDENT_AMBULATORY_CARE_PROVIDER_SITE_OTHER): Payer: Self-pay | Admitting: Primary Care

## 2024-04-14 ENCOUNTER — Other Ambulatory Visit: Payer: Self-pay

## 2024-04-14 DIAGNOSIS — Z760 Encounter for issue of repeat prescription: Secondary | ICD-10-CM

## 2024-04-15 MED ORDER — RISPERIDONE 1 MG PO TABS
1.0000 mg | ORAL_TABLET | Freq: Every day | ORAL | 0 refills | Status: AC
Start: 2024-04-15 — End: ?
  Filled 2024-04-15: qty 30, 30d supply, fill #0

## 2024-04-15 NOTE — Telephone Encounter (Signed)
Will forward to covering provider.

## 2024-04-19 ENCOUNTER — Other Ambulatory Visit: Payer: Self-pay

## 2024-04-19 MED ORDER — ATORVASTATIN CALCIUM 40 MG PO TABS
40.0000 mg | ORAL_TABLET | Freq: Every day | ORAL | 1 refills | Status: AC
  Filled 2024-04-19: qty 90, 90d supply, fill #0

## 2024-04-19 MED ORDER — RISPERIDONE 1 MG PO TABS
1.0000 mg | ORAL_TABLET | Freq: Every day | ORAL | 1 refills | Status: DC
  Filled 2024-04-19 – 2024-05-14 (×2): qty 90, 90d supply, fill #0
  Filled 2024-09-06: qty 90, 90d supply, fill #1

## 2024-04-19 NOTE — Progress Notes (Signed)
 [facility]  Janet Ramos, is a 63 y.o. female  [id number]:[id number]  [id number]  DOB - 22-Sep-1961  Chief Complaint  Patient presents with   Blood Pressure Check       Subjective:   Janet Ramos is a 63 y.o. female here today for a follow up Bp elevated last visit. Patient has No headache, No chest pain, No abdominal pain - No Nausea, No new weakness tingling or numbness, No Cough - shortness of breath HPI  No problems updated.  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   left eye injury 09-16-2014  s/p ruptured globe repair;   09-23-2014 s/p enucleation w/ implant   Fatty liver    with elevated LFTs , 06-06-2021   History of gastritis 05/2021   per EGD   History of seizure due to alcohol withdrawal    2020  none since per pt   History of trichomoniasis 2010   Hyperlipidemia, mixed    Seizures (HCC)    last time 4 years ago per pt   Thickened endometrium    Wears glasses     Current Outpatient Medications on File Prior to Visit  Medication Sig Dispense Refill   methylPREDNISolone  (MEDROL ) 4 MG tablet Take as directed (Patient not taking: Reported on 03/16/2024) 21 tablet 0   No current facility-administered medications on file prior to visit.   Health Maintenance  Topic Date Due   Cologuard (Stool DNA test)  Never done   Screening for Lung Cancer  06/06/2022   COVID-19 Vaccine (3 - 2024-25 season) 06/15/2023   Mammogram  01/19/2024   Zoster (Shingles) Vaccine (1 of 2) 07/06/2024*   Pneumococcal Vaccination (1 of 2 - PCV) 04/05/2025*   Flu Shot  05/14/2024   DTaP/Tdap/Td vaccine (2 - Td  or Tdap) 09/16/2024   Pap with HPV screening  09/14/2025   Hepatitis C Screening  Completed   HIV Screening  Completed   Hepatitis B Vaccine  Aged Out   HPV Vaccine  Aged Out   Meningitis B Vaccine  Aged Out  *Topic was postponed. The date shown is not the original due date.    Objective:   Vitals:   04/13/24 0927  BP: 130/88  Resp: 16  SpO2: 97%  Weight: 176 lb 3.2 oz (79.9 kg)   BP Readings from Last 3 Encounters:  04/13/24 130/88  03/16/24 (!) 156/96  08/19/23 131/87      Physical Exam Vitals reviewed.  Constitutional:      Appearance: Normal appearance. She is obese.  HENT:     Head: Normocephalic.     Right Ear: Tympanic membrane, ear canal and external ear normal.     Left Ear: Tympanic membrane, ear canal and external ear normal.     Nose: Nose normal.     Mouth/Throat:     Mouth: Mucous membranes are moist.  Eyes:     Extraocular Movements: Extraocular movements intact.     Pupils: Pupils are equal, round, and reactive to light.  Cardiovascular:     Rate and Rhythm: Normal rate.  Pulmonary:     Breath sounds: Wheezing and rhonchi present.  Abdominal:     General: Bowel sounds are normal.     Palpations: Abdomen is soft.  Musculoskeletal:        General: Normal range of motion.     Cervical back: Normal range of motion.  Skin:    General: Skin is warm and dry.  Neurological:     Mental Status: She is alert and oriented to person, place, and time.  Psychiatric:        Mood and Affect: Mood normal.        Behavior: Behavior normal.        Thought Content: Thought content normal.    Assessment & Plan  Janet Ramos was seen today for blood pressure check.  Diagnoses and all orders for this visit:  Tobacco abuse -     CT CHEST LUNG CANCER SCREENING LOW DOSE WO CONTRAST; Future  Medication refill -     risperiDONE  (RISPERDAL ) 1 MG tablet; Take 1 tablet (1 mg total) by mouth at bedtime. Needs appointment for futher refills.  Mixed hyperlipidemia -      atorvastatin  (LIPITOR) 40 MG tablet; Take 1 tablet (40 mg total) by mouth daily.  Colon cancer screening -     Cologuard   Encounter for screening mammogram for malignant neoplasm of breast -     MM 3D SCREENING MAMMOGRAM BILATERAL BREAST; Future      Patient have been counseled extensively about nutrition and exercise. Other issues discussed during this visit include: low cholesterol diet, weight control and daily exercise, foot care, annual eye examinations at Ophthalmology, importance of adherence with medications and regular follow-up. We also discussed long term complications of uncontrolled diabetes and hypertension.   Return in about 3 months (around 07/14/2024).  The patient was given clear instructions to go to ER or return to medical center if symptoms don't improve, worsen or new problems develop. The patient verbalized understanding. The patient was told to call to get lab results if they haven't heard anything in the next week.   This note has been created with Education officer, environmental. Any transcriptional errors are unintentional.   Janet SHAUNNA Bohr, NP 04/19/2024, 11:54 AM

## 2024-05-14 ENCOUNTER — Other Ambulatory Visit: Payer: Self-pay

## 2024-05-19 ENCOUNTER — Other Ambulatory Visit: Payer: Self-pay

## 2024-05-21 ENCOUNTER — Inpatient Hospital Stay: Admission: RE | Admit: 2024-05-21 | Payer: MEDICAID | Source: Ambulatory Visit

## 2024-09-06 ENCOUNTER — Other Ambulatory Visit: Payer: Self-pay

## 2024-09-19 ENCOUNTER — Emergency Department (HOSPITAL_COMMUNITY)
Admission: EM | Admit: 2024-09-19 | Discharge: 2024-09-19 | Disposition: A | Discharge status: Home / Self Care | Payer: MEDICAID | Attending: Emergency Medicine | Admitting: Emergency Medicine

## 2024-09-19 ENCOUNTER — Emergency Department (HOSPITAL_COMMUNITY): Payer: MEDICAID

## 2024-09-19 DIAGNOSIS — S61210A Laceration without foreign body of right index finger without damage to nail, initial encounter: Secondary | ICD-10-CM | POA: Insufficient documentation

## 2024-09-19 DIAGNOSIS — S61216A Laceration without foreign body of right little finger without damage to nail, initial encounter: Secondary | ICD-10-CM | POA: Insufficient documentation

## 2024-09-19 DIAGNOSIS — S61219A Laceration without foreign body of unspecified finger without damage to nail, initial encounter: Secondary | ICD-10-CM

## 2024-09-19 DIAGNOSIS — M7989 Other specified soft tissue disorders: Secondary | ICD-10-CM | POA: Insufficient documentation

## 2024-09-19 DIAGNOSIS — Z23 Encounter for immunization: Secondary | ICD-10-CM | POA: Insufficient documentation

## 2024-09-19 MED ORDER — OXYCODONE-ACETAMINOPHEN 5-325 MG PO TABS
1.0000 | ORAL_TABLET | Freq: Once | ORAL | Status: AC
  Administered 2024-09-19: 1 via ORAL
  Filled 2024-09-19: qty 1

## 2024-09-19 MED ORDER — BUPIVACAINE HCL (PF) 0.5 % IJ SOLN
10.0000 mL | Freq: Once | INTRAMUSCULAR | Status: AC
  Administered 2024-09-19: 10 mL
  Filled 2024-09-19: qty 10

## 2024-09-19 MED ORDER — TETANUS-DIPHTH-ACELL PERTUSSIS 5-2-15.5 LF-MCG/0.5 IM SUSP
0.5000 mL | Freq: Once | INTRAMUSCULAR | Status: AC
  Administered 2024-09-19: 0.5 mL via INTRAMUSCULAR
  Filled 2024-09-19: qty 0.5

## 2024-09-19 NOTE — Discharge Instructions (Signed)
 You were seen in the emergency department for your right hand lacerations.   I am concerned that you have injured your tendon and that is why you cannot move your finger.  We have placed you in a splint for support and you need to follow-up with a hand surgeon to have this evaluated.  You need to wear the splint until you follow-up with the hand specialist. You can remove it and clean it with soap and water, just be sure to replace the dressings and splints afterwards.  You will likely need someone to help you with this.  I have given you a referral with a number to call to schedule an appointment.  Please call first thing tomorrow morning.  We have closed your laceration(s) with sutures. These need to be removed in 7-10 days.  This can be done at your hand surgeons office.   If any of the sutures come out before it is time for removal, that is okay. Make sure to keep the area as clean and dry as possible. You can let warm soapy water run over the area, but do NOT scrub it.   Watch out for signs of infection, like we discussed, including: increased redness, tenderness, or drainage of pus from the area. If this happens and you have not been prescribed an antibiotic, please seek medical attention for possible infection.   You can take over the counter pain medicine like ibuprofen  or tylenol  as needed.

## 2024-09-19 NOTE — Progress Notes (Signed)
 Orthopedic Tech Progress Note Patient Details:  Janet Ramos 08/02/1961 [id number]  Ortho Devices Type of Ortho Device: Finger splint Ortho Device/Splint Location: rue index and pinky finger Ortho Device/Splint Interventions: Ordered, Adjustment, Application   Post Interventions Patient Tolerated: Well Instructions Provided: Care of device, Adjustment of device  Chandra Dorn PARAS 09/19/2024, 9:09 PM
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
## 2024-09-19 NOTE — ED Notes (Signed)
 Pt ambulated to the restroom with assistance. States she felt dizzy on the way back from the restroom, this NT assisted the patient back to bed with assistance. Patient is resting and watching tv at this time.
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
## 2024-09-23 ENCOUNTER — Other Ambulatory Visit: Payer: Self-pay

## 2024-09-23 MED ORDER — OXYCODONE HCL 5 MG PO TABS
5.0000 mg | ORAL_TABLET | Freq: Four times a day (QID) | ORAL | 0 refills | Status: AC
  Filled 2024-09-23: qty 28, 7d supply, fill #0

## 2024-11-12 ENCOUNTER — Other Ambulatory Visit (INDEPENDENT_AMBULATORY_CARE_PROVIDER_SITE_OTHER): Payer: Self-pay | Admitting: Primary Care

## 2024-11-12 DIAGNOSIS — Z760 Encounter for issue of repeat prescription: Secondary | ICD-10-CM

## 2024-11-12 NOTE — Telephone Encounter (Signed)
 Has appointment planned 2/27 for refills.     Copied from CRM #[id number]. Topic: Clinical - Medication Refill >> Nov 12, 2024  3:11 PM Mia F wrote: Medication: risperiDONE  (RISPERDAL ) 1 MG tablet  Has the patient contacted their pharmacy? Yes (Agent: If no, request that the patient contact the pharmacy for the refill. If patient does not wish to contact the pharmacy document the reason why and proceed with request.) (Agent: If yes, when and what did the pharmacy advise?)  This is the patient's preferred pharmacy:  [facility] [address] [place] [address] Phone: [phone] Fax: [phone]  Is this the correct pharmacy for this prescription? Yes If no, delete pharmacy and type the correct one.   Has the prescription been filled recently? No  Is the patient out of the medication? No  Has the patient been seen for an appointment in the last year OR does the patient have an upcoming appointment? Yes  Can we respond through MyChart? Yes  Agent: Please be advised that Rx refills may take up to 3 business days. We ask that you follow-up with your pharmacy.

## 2024-11-17 ENCOUNTER — Other Ambulatory Visit: Payer: Self-pay

## 2024-11-17 ENCOUNTER — Telehealth (INDEPENDENT_AMBULATORY_CARE_PROVIDER_SITE_OTHER): Payer: Self-pay | Admitting: Primary Care

## 2024-11-17 DIAGNOSIS — Z760 Encounter for issue of repeat prescription: Secondary | ICD-10-CM

## 2024-11-18 ENCOUNTER — Ambulatory Visit (INDEPENDENT_AMBULATORY_CARE_PROVIDER_SITE_OTHER): Payer: Self-pay | Admitting: Primary Care

## 2024-11-18 NOTE — Telephone Encounter (Signed)
 duplicate

## 2024-11-18 NOTE — Telephone Encounter (Signed)
 Patient called states she is leaving out of town to [place] , and needs the med refilled by tomorrow so she can take it with her

## 2024-11-18 NOTE — Telephone Encounter (Signed)
 Will forward to provider

## 2024-11-19 ENCOUNTER — Other Ambulatory Visit: Payer: Self-pay

## 2024-11-19 ENCOUNTER — Telehealth (INDEPENDENT_AMBULATORY_CARE_PROVIDER_SITE_OTHER): Payer: Self-pay | Admitting: Primary Care

## 2024-11-19 ENCOUNTER — Other Ambulatory Visit (INDEPENDENT_AMBULATORY_CARE_PROVIDER_SITE_OTHER): Payer: Self-pay

## 2024-11-19 DIAGNOSIS — Z760 Encounter for issue of repeat prescription: Secondary | ICD-10-CM

## 2024-11-19 MED ORDER — RISPERIDONE 1 MG PO TABS
1.0000 mg | ORAL_TABLET | Freq: Every day | ORAL | 0 refills | Status: AC
  Filled 2024-11-19: qty 30, 30d supply, fill #0

## 2024-11-19 NOTE — Telephone Encounter (Signed)
 Kellen please reach out to pt and make aware that provider okayed a 30 day supply of medication till her upcoming appt

## 2024-11-19 NOTE — Telephone Encounter (Signed)
 Copied from CRM [id number]. Topic: Clinical - Medication Question >> Nov 19, 2024  1:38 PM Pinkey ORN wrote: Reason for CRM: Med Refill >> Nov 19, 2024  1:39 PM Pinkey ORN wrote: Patient called, states she put in a request earlier in the week for risperiDONE  (RISPERDAL ) 1 MG tablet and still hasn't heard anything nor does the pharmacy have it. Patient states she's supposed to be leaving out of town this evening for an emergency and she still doesn't have her medication. Patient is requesting to pickup the medication before she leaves.

## 2024-11-19 NOTE — Telephone Encounter (Signed)
 Error

## 2024-12-10 ENCOUNTER — Ambulatory Visit (INDEPENDENT_AMBULATORY_CARE_PROVIDER_SITE_OTHER): Payer: MEDICAID | Admitting: Primary Care
